# Patient Record
Sex: Female | Born: 1940 | Race: White | Hispanic: No | Smoking: Former smoker
Health system: Southern US, Community
[De-identification: ages and names within clinical notes are randomized; demographics above are authoritative.]

## PROBLEM LIST (undated history)

## (undated) DIAGNOSIS — G629 Polyneuropathy, unspecified: Secondary | ICD-10-CM

## (undated) DIAGNOSIS — M1990 Unspecified osteoarthritis, unspecified site: Secondary | ICD-10-CM

## (undated) DIAGNOSIS — G4733 Obstructive sleep apnea (adult) (pediatric): Secondary | ICD-10-CM

## (undated) DIAGNOSIS — H3530 Unspecified macular degeneration: Secondary | ICD-10-CM

## (undated) DIAGNOSIS — I5032 Chronic diastolic (congestive) heart failure: Secondary | ICD-10-CM

## (undated) DIAGNOSIS — M1711 Unilateral primary osteoarthritis, right knee: Secondary | ICD-10-CM

## (undated) DIAGNOSIS — E538 Deficiency of other specified B group vitamins: Secondary | ICD-10-CM

## (undated) DIAGNOSIS — E785 Hyperlipidemia, unspecified: Secondary | ICD-10-CM

## (undated) DIAGNOSIS — Z9989 Dependence on other enabling machines and devices: Secondary | ICD-10-CM

## (undated) DIAGNOSIS — Z9889 Other specified postprocedural states: Secondary | ICD-10-CM

## (undated) DIAGNOSIS — R112 Nausea with vomiting, unspecified: Secondary | ICD-10-CM

## (undated) DIAGNOSIS — I480 Paroxysmal atrial fibrillation: Secondary | ICD-10-CM

## (undated) DIAGNOSIS — D649 Anemia, unspecified: Secondary | ICD-10-CM

## (undated) DIAGNOSIS — I739 Peripheral vascular disease, unspecified: Secondary | ICD-10-CM

## (undated) DIAGNOSIS — I6523 Occlusion and stenosis of bilateral carotid arteries: Secondary | ICD-10-CM

## (undated) DIAGNOSIS — E119 Type 2 diabetes mellitus without complications: Secondary | ICD-10-CM

## (undated) DIAGNOSIS — Z7901 Long term (current) use of anticoagulants: Secondary | ICD-10-CM

## (undated) DIAGNOSIS — D472 Monoclonal gammopathy: Secondary | ICD-10-CM

## (undated) DIAGNOSIS — N183 Chronic kidney disease, stage 3 unspecified: Secondary | ICD-10-CM

## (undated) DIAGNOSIS — Z794 Long term (current) use of insulin: Secondary | ICD-10-CM

## (undated) DIAGNOSIS — K219 Gastro-esophageal reflux disease without esophagitis: Secondary | ICD-10-CM

## (undated) DIAGNOSIS — E11319 Type 2 diabetes mellitus with unspecified diabetic retinopathy without macular edema: Secondary | ICD-10-CM

## (undated) DIAGNOSIS — I10 Essential (primary) hypertension: Secondary | ICD-10-CM

## (undated) DIAGNOSIS — Z8489 Family history of other specified conditions: Secondary | ICD-10-CM

## (undated) HISTORY — PX: CARDIOVASCULAR STRESS TEST: SHX262

## (undated) HISTORY — PX: TRANSTHORACIC ECHOCARDIOGRAM: SHX275

## (undated) HISTORY — PX: CARDIAC CATHETERIZATION: SHX172

## (undated) HISTORY — PX: TONSILLECTOMY: SUR1361

## (undated) HISTORY — DX: Hyperlipidemia, unspecified: E78.5

## (undated) HISTORY — PX: CAROTID ENDARTERECTOMY: SUR193

---

## 1966-05-19 HISTORY — PX: CHOLECYSTECTOMY OPEN: SUR202

## 1998-04-19 ENCOUNTER — Encounter: Payer: Self-pay | Admitting: Family Medicine

## 1998-04-19 ENCOUNTER — Ambulatory Visit (HOSPITAL_COMMUNITY): Admission: RE | Admit: 1998-04-19 | Discharge: 1998-04-19 | Payer: Self-pay | Admitting: Family Medicine

## 2000-03-13 ENCOUNTER — Encounter: Admission: RE | Admit: 2000-03-13 | Discharge: 2000-03-31 | Payer: Self-pay | Admitting: Orthopedic Surgery

## 2000-04-24 ENCOUNTER — Other Ambulatory Visit: Admission: RE | Admit: 2000-04-24 | Discharge: 2000-04-24 | Payer: Self-pay | Admitting: Family Medicine

## 2000-05-04 ENCOUNTER — Encounter: Payer: Self-pay | Admitting: Family Medicine

## 2000-05-04 ENCOUNTER — Encounter: Admission: RE | Admit: 2000-05-04 | Discharge: 2000-05-04 | Payer: Self-pay | Admitting: Family Medicine

## 2003-06-20 ENCOUNTER — Other Ambulatory Visit: Admission: RE | Admit: 2003-06-20 | Discharge: 2003-06-20 | Payer: Self-pay | Admitting: Family Medicine

## 2004-06-13 ENCOUNTER — Ambulatory Visit (HOSPITAL_COMMUNITY): Admission: RE | Admit: 2004-06-13 | Discharge: 2004-06-13 | Payer: Self-pay | Admitting: Family Medicine

## 2005-05-26 ENCOUNTER — Ambulatory Visit (HOSPITAL_COMMUNITY): Admission: RE | Admit: 2005-05-26 | Discharge: 2005-05-26 | Payer: Self-pay | Admitting: Cardiology

## 2005-05-27 ENCOUNTER — Inpatient Hospital Stay (HOSPITAL_BASED_OUTPATIENT_CLINIC_OR_DEPARTMENT_OTHER): Admission: RE | Admit: 2005-05-27 | Discharge: 2005-05-27 | Payer: Self-pay | Admitting: Cardiology

## 2005-06-05 ENCOUNTER — Encounter: Admission: RE | Admit: 2005-06-05 | Discharge: 2005-06-05 | Payer: Self-pay | Admitting: *Deleted

## 2005-06-06 ENCOUNTER — Inpatient Hospital Stay (HOSPITAL_COMMUNITY): Admission: RE | Admit: 2005-06-06 | Discharge: 2005-06-07 | Payer: Self-pay | Admitting: *Deleted

## 2005-06-06 ENCOUNTER — Encounter (INDEPENDENT_AMBULATORY_CARE_PROVIDER_SITE_OTHER): Payer: Self-pay | Admitting: Specialist

## 2006-06-11 ENCOUNTER — Encounter: Admission: RE | Admit: 2006-06-11 | Discharge: 2006-06-11 | Payer: Self-pay | Admitting: Internal Medicine

## 2006-09-21 ENCOUNTER — Ambulatory Visit: Payer: Self-pay | Admitting: *Deleted

## 2007-05-06 ENCOUNTER — Ambulatory Visit: Payer: Self-pay | Admitting: *Deleted

## 2007-11-11 ENCOUNTER — Ambulatory Visit: Payer: Self-pay | Admitting: *Deleted

## 2008-04-20 ENCOUNTER — Ambulatory Visit: Payer: Self-pay | Admitting: *Deleted

## 2008-10-20 ENCOUNTER — Ambulatory Visit: Payer: Self-pay | Admitting: *Deleted

## 2009-02-18 ENCOUNTER — Encounter: Admission: RE | Admit: 2009-02-18 | Discharge: 2009-02-18 | Payer: Self-pay | Admitting: Sports Medicine

## 2009-02-22 ENCOUNTER — Encounter: Admission: RE | Admit: 2009-02-22 | Discharge: 2009-02-22 | Payer: Self-pay | Admitting: Sports Medicine

## 2009-03-08 ENCOUNTER — Encounter: Admission: RE | Admit: 2009-03-08 | Discharge: 2009-03-08 | Payer: Self-pay | Admitting: Sports Medicine

## 2009-05-17 ENCOUNTER — Ambulatory Visit: Payer: Self-pay | Admitting: Vascular Surgery

## 2009-06-22 ENCOUNTER — Ambulatory Visit: Payer: Self-pay | Admitting: Vascular Surgery

## 2009-07-10 ENCOUNTER — Encounter: Admission: RE | Admit: 2009-07-10 | Discharge: 2009-07-10 | Payer: Self-pay | Admitting: Sports Medicine

## 2009-11-26 ENCOUNTER — Ambulatory Visit: Payer: Self-pay | Admitting: Surgery

## 2010-01-14 ENCOUNTER — Ambulatory Visit: Payer: Self-pay | Admitting: Family Medicine

## 2010-01-14 DIAGNOSIS — E1169 Type 2 diabetes mellitus with other specified complication: Secondary | ICD-10-CM

## 2010-01-14 DIAGNOSIS — I10 Essential (primary) hypertension: Secondary | ICD-10-CM

## 2010-01-14 DIAGNOSIS — L259 Unspecified contact dermatitis, unspecified cause: Secondary | ICD-10-CM | POA: Insufficient documentation

## 2010-01-14 DIAGNOSIS — E785 Hyperlipidemia, unspecified: Secondary | ICD-10-CM | POA: Insufficient documentation

## 2010-01-14 DIAGNOSIS — L03019 Cellulitis of unspecified finger: Secondary | ICD-10-CM | POA: Insufficient documentation

## 2010-01-14 DIAGNOSIS — E669 Obesity, unspecified: Secondary | ICD-10-CM | POA: Insufficient documentation

## 2010-01-17 ENCOUNTER — Telehealth (INDEPENDENT_AMBULATORY_CARE_PROVIDER_SITE_OTHER): Payer: Self-pay | Admitting: *Deleted

## 2010-05-27 ENCOUNTER — Ambulatory Visit
Admission: RE | Admit: 2010-05-27 | Discharge: 2010-05-27 | Payer: Self-pay | Source: Home / Self Care | Attending: Surgery | Admitting: Surgery

## 2010-06-20 NOTE — Progress Notes (Signed)
  Phone Note Outgoing Call Call back at [facility] Phone [phone]   Call placed by: Lajean Saver RN,  January 17, 2010 2:03 PM Call placed to: Patient Summary of Call: Call back: no answer, message left with reason for call

## 2010-06-20 NOTE — Assessment & Plan Note (Signed)
Summary: RIGHT THUMB FINGER INFECTED (procedure)   Vital Signs:  Patient Profile:   69 Years Old Female CC:      ?right thumb infection x 10 days Height:     64.5 inches Weight:      204 pounds O2 Sat:      96 % O2 treatment:    Room Air Temp:     98.3 degrees F oral Pulse rate:   61 / minute Resp:     16 per minute BP sitting:   140 / 73  (left arm) Cuff size:   large  Pt. in pain?   no  Vitals Entered By: Lajean Saver RN (January 14, 2010 11:18 AM)                   Updated Prior Medication List: HUMALOG MIX 75/25 75-25 % SUSP (INSULIN LISPRO PROT & LISPRO)  LANTUS 100 UNIT/ML SOLN (INSULIN GLARGINE) 40u qhs OMEPRAZOLE-SODIUM BICARBONATE 20-1100 MG CAPS (OMEPRAZOLE-SODIUM BICARBONATE)  LOSARTAN POTASSIUM 50 MG TABS (LOSARTAN POTASSIUM)  METOPROLOL-HYDROCHLOROTHIAZIDE 50-25 MG TABS (METOPROLOL-HYDROCHLOROTHIAZIDE)  ASPIRIN 325 MG TABS (ASPIRIN) once daily TRILIPIX 135 MG CPDR (CHOLINE FENOFIBRATE) once daily CRESTOR 40 MG TABS (ROSUVASTATIN CALCIUM) once daily  Current Allergies: ! DARVOCET ! PERCOCET ! * CONTRAST DYEHistory of Present Illness Chief Complaint: ?right thumb infection x 10 days History of Present Illness:  Subjective:  Patient complains of two problems: 1)  She bumped her right thumb about 10 days ago and has developed persistent soreness/swelling on medial edge of fingernail 2)  For about 3 weeks she has had recurring peeling of her hands, feet, and tips of elbows without burning, itching, or discomfort.  She states that she has been swimming frequently during this interval.  REVIEW OF SYSTEMS Constitutional Symptoms      Denies fever, chills, night sweats, weight loss, weight gain, and fatigue.  Eyes       Denies change in vision, eye pain, eye discharge, glasses, contact lenses, and eye surgery. Ear/Nose/Throat/Mouth       Denies hearing loss/aids, change in hearing, ear pain, ear discharge, dizziness, frequent runny nose, frequent nose bleeds,  sinus problems, sore throat, hoarseness, and tooth pain or bleeding.  Respiratory       Denies dry cough, productive cough, wheezing, shortness of breath, asthma, bronchitis, and emphysema/COPD.  Cardiovascular       Denies murmurs, chest pain, and tires easily with exhertion.    Gastrointestinal       Denies stomach pain, nausea/vomiting, diarrhea, constipation, blood in bowel movements, and indigestion. Genitourniary       Denies painful urination, kidney stones, and loss of urinary control. Neurological       Denies paralysis, seizures, and fainting/blackouts. Musculoskeletal       Denies muscle pain, joint pain, joint stiffness, decreased range of motion, redness, swelling, muscle weakness, and gout.  Skin       Complains of hair/skni or nail changes.      Denies bruising and unusual mles/lumps or sores.      Comments: right thumb Psych       Denies mood changes, temper/anger issues, anxiety/stress, speech problems, depression, and sleep problems.  Past History:  Past Medical History: Diabetes mellitus, type I Hyperlipidemia Hypertension  Past Surgical History: Cholecystectomy 1967 Spinal Injections Right endarterectomy  Family History: Family History of Stroke F 1st degree relative <60 Family History Diabetes 1st degree relative  Social History: Never Smoked Alcohol use-no Drug use-no Smoking Status:  never Drug Use:  no   Objective:  No acute distress  Right thumb:  There is tenderness along medial edge of nail without erythema or warmth; minimal swelling.  Normal cap refill Skin:  Palmar surfaces of hands and web spaces:  mild desquamation without erythema, swelling, or tenderness. Feet reveal similar mild desquamation on plantar surfaces.  Over the olecranon surfaces of elbows there is mild hyperkeratosis. Assessment New Problems: DERMATITIS (ICD-692.9) PARONYCHIA, FINGER (ICD-681.02) FAMILY HISTORY DIABETES 1ST DEGREE RELATIVE (ICD-V18.0) HYPERTENSION  (ICD-401.9) HYPERLIPIDEMIA (ICD-272.4) DIABETES MELLITUS, TYPE I (ICD-250.01)  ? ATOPIC DERMATITIS.  DOUBT TINEA MANUUM  Plan New Medications/Changes: TRIAMCINOLONE ACETONIDE 0.1 % CREA (TRIAMCINOLONE ACETONIDE) Apply thin layer to affected area bid  #45gm x 0, 01/14/2010, Donna Christen MD CEPHALEXIN 500 MG CAPS (CEPHALEXIN) One by mouth two times a day  #20 x 0, 01/14/2010, Donna Christen MD  New Orders: New Patient Level III [phone] Planning Comments:   For fingernail begin Keflex and warm soaks.  Given a Water quality scientist patient information and instruction sheet on topic.  Follow-up with PCP if not improving. Begin triamcinolone cream two times a day until improved.  Follow-up with dermatologist if not improving 2 weeks.   The patient and/or caregiver has been counseled thoroughly with regard to medications prescribed including dosage, schedule, interactions, rationale for use, and possible side effects and they verbalize understanding.  Diagnoses and expected course of recovery discussed and will return if not improved as expected or if the condition worsens. Patient and/or caregiver verbalized understanding.  Prescriptions: TRIAMCINOLONE ACETONIDE 0.1 % CREA (TRIAMCINOLONE ACETONIDE) Apply thin layer to affected area bid  #45gm x 0   Entered and Authorized by:   Donna Christen MD   Signed by:   Donna Christen MD on 01/14/2010   Method used:   Print then Give to Patient   RxID:   [id number] CEPHALEXIN 500 MG CAPS (CEPHALEXIN) One by mouth two times a day  #20 x 0   Entered and Authorized by:   Donna Christen MD   Signed by:   Donna Christen MD on 01/14/2010   Method used:   Print then Give to Patient   RxID:   [id number]   Orders Added: 1)  New Patient Level III [29528]

## 2010-10-01 NOTE — Procedures (Signed)
CAROTID DUPLEX EXAM   INDICATION:  Followup carotid artery disease.   HISTORY:  Diabetes:  Yes.  Cardiac:  No.  Hypertension:  Yes.  Smoking:  No.  Previous Surgery:  Right CEA 06/06/2005 by Dr. Madilyn Fireman.  CV History:  No.  Amaurosis Fugax No, Paresthesias No, Hemiparesis No                                       RIGHT             LEFT  Brachial systolic pressure:         152               146  Brachial Doppler waveforms:         Biphasic          Biphasic  Vertebral direction of flow:        Antegrade         Antegrade  DUPLEX VELOCITIES (cm/sec)  CCA peak systolic                   114               M=627, B=255  ECA peak systolic                   221               235  ICA peak systolic                   138 (M/D)         281  ICA end diastolic                   36                69  PLAQUE MORPHOLOGY:                  Intimal hyperplasia                 Mixed  PLAQUE AMOUNT:                      Minimal           Moderate  PLAQUE LOCATION:                    Bifurcation.      CCA/ECA/ICA   IMPRESSION:  1. Right ICA velocity status post CEA appears stable being mildly      elevated with no significant pathology noted.  2. Left ICA shows evidence of 60-79% stenosis.  3. Left distal CCA stenosis.  4. Bilaterally ECA stenosis.  5. No significant changes from previous study.   ___________________________________________  P. Liliane Bade, M.D.   AS/MEDQ  D:  11/11/2007  T:  11/11/2007  Job:  [id number]

## 2010-10-01 NOTE — Procedures (Signed)
CAROTID DUPLEX EXAM   INDICATION:  Carotid disease.   HISTORY:  Diabetes:  Yes.  Cardiac:  No.  Hypertension:  Yes.  Smoking:  No.  Previous Surgery:  Right carotid endarterectomy on 06/06/2005.  CV History:  Currently asymptomatic.  Amaurosis Fugax No, Paresthesias No, Hemiparesis No                                       RIGHT             LEFT  Brachial systolic pressure:         156               152  Brachial Doppler waveforms:         Normal            Normal  Vertebral direction of flow:        Antegrade         Antegrade  DUPLEX VELOCITIES (cm/sec)  CCA peak systolic                   78                59  ECA peak systolic                   138               224  ICA peak systolic                   81                P=191, M=211  ICA end diastolic                   12                P=36, M=44  PLAQUE MORPHOLOGY:                  Heterogeneous     Heterogeneous  PLAQUE AMOUNT:                      Mild              Moderate  PLAQUE LOCATION:                    ICA / CCA         CCA / ECA /  proximal ICA   IMPRESSION:  1. Patent right carotid endarterectomy site with 1% to 39% stenosis of      the right internal carotid artery.  2. Doppler velocities suggest a high end 40% to 59% stenosis of the      left mid internal carotid artery.  3. Significant vessel tortuosity is noted within the left mid internal      carotid artery.  4. Doppler velocities of the left internal carotid artery appear less      than previously recorded when compared to the previous exam on      11/26/2009 with the right internal carotid artery remaining stable.   ___________________________________________  V. Charlena Cross, MD   CH/MEDQ  D:  05/28/2010  T:  05/28/2010  Job:  [id number]

## 2010-10-01 NOTE — Procedures (Signed)
CAROTID DUPLEX EXAM   INDICATION:  Followup evaluation for known carotid artery disease.   HISTORY:  Diabetes:  Yes.  Cardiac:  No.  Hypertension:  Yes.  Smoking:  No.  Previous Surgery:  Right carotid endarterectomy on 06/06/2005 by Dr.  Madilyn Fireman.  CV History:  The patient reports no cerebrovascular symptoms.  Previous  duplex on 04/20/2008 revealed 20-39% right ICA stenosis status post  endarterectomy and a 60-79% left ICA stenosis.  Amaurosis Fugax No, Paresthesias No, Hemiparesis No                                       RIGHT             LEFT  Brachial systolic pressure:         166               168  Brachial Doppler waveforms:         Triphasic         Triphasic  Vertebral direction of flow:        Antegrade         Antegrade  DUPLEX VELOCITIES (cm/sec)  CCA peak systolic                   60                55  ECA peak systolic                   226               402  ICA peak systolic                   104               274  ICA end diastolic                   30                68  PLAQUE MORPHOLOGY:                  Soft              Mixed  PLAQUE AMOUNT:                      Minimal           Moderate  PLAQUE LOCATION:                    Proximal ICA      Proximal ICA   IMPRESSION:  1. 20-39% right ICA stenosis status post endarterectomy.  2. 60-79% left ICA stenosis.  3. Bilateral ECA stenosis.  4. No significant change from previous study performed 04/20/2008.   ___________________________________________  P. Liliane Bade, M.D.   MC/MEDQ  D:  10/20/2008  T:  10/20/2008  Job:  [id number]

## 2010-10-01 NOTE — Procedures (Signed)
CAROTID DUPLEX EXAM   INDICATION:  Followup carotid artery disease.   HISTORY:  Diabetes:  Yes.  Cardiac:  No.  Hypertension:  Yes.  Smoking:  No.  Previous Surgery:  Right CEA 0l/19/2007 by Dr. Madilyn Fireman.  CV History:  Asymptomatic.  Amaurosis Fugax No, Paresthesias No, Hemiparesis No                                       RIGHT             LEFT  Brachial systolic pressure:         140               138  Brachial Doppler waveforms:         WNL               WNL  Vertebral direction of flow:        Antegrade         Antegrade  DUPLEX VELOCITIES (cm/sec)  CCA peak systolic                   107               M=69, D=299  ECA peak systolic                   225               300  ICA peak systolic                   108               316  ICA end diastolic                   34                77  PLAQUE MORPHOLOGY:                  Homogeneous       Mixed  PLAQUE AMOUNT:                      Minimal           Moderate / severe  PLAQUE LOCATION:                    ICA / ECA         ICA / CCA / ECA   IMPRESSION:  1. Right internal carotid artery shows evidence of 20%-39% stenosis      (top end of range).  2. Left internal carotid artery shows evidence of 60%-79% stenosis.  3. Left distal common carotid artery stenosis, which extends into the      internal carotid artery and external carotid artery.  4. Bilateral external carotid artery stenosis.  5. No significant changes from recent previous studies.  6. Previous patient of Dr. Madilyn Fireman with followup appointment scheduled      to see Dr. Arbie Cookey.   ___________________________________________  Larina Earthly, M.D.   AS/MEDQ  D:  05/17/2009  T:  05/17/2009  Job:  [id number]

## 2010-10-01 NOTE — Procedures (Signed)
CAROTID DUPLEX EXAM   INDICATION:  Follow up known carotid disease.   HISTORY:  Diabetes:  Yes.  Cardiac:  No.  Hypertension:  Yes.  Smoking:  No.  Previous Surgery:  Right carotid endarterectomy on 06/06/05 by Dr.  Madilyn Fireman.  CV History:  Asymptomatic.  Amaurosis Fugax No, Paresthesias No hemiparesis No.                                       RIGHT             LEFT  Brachial systolic pressure:         150               148  Brachial Doppler waveforms:         Normal            Normal  Vertebral direction of flow:        Antegrade         Antegrade  DUPLEX VELOCITIES (cm/sec)  CCA peak systolic                   93                68  ECA peak systolic                   202               392  ICA peak systolic                   109               305  ICA end diastolic                   38                65  PLAQUE MORPHOLOGY:                  Homogeneous       Mixed  PLAQUE AMOUNT:                      Minimal           Moderate-to-severe  PLAQUE LOCATION:                    ICA, ECA          ICA, ECA, CCA   IMPRESSION:  1. Doppler velocities suggest high-end 20% to 39% stenosis in the      right internal carotid artery.  2. Doppler velocities suggest mid to high-end 60% to 79% stenosis in      the left internal carotid artery.  3. Left external carotid artery stenosis.  4. Antegrade flow in bilateral vertebral arteries.  5. No significant change from previous examinations.   ___________________________________________  V. Charlena Cross, MD   NT/MEDQ  D:  11/26/2009  T:  11/26/2009  Job:  [id number]

## 2010-10-01 NOTE — Assessment & Plan Note (Signed)
OFFICE VISIT   Gloria [name], CYPERT A  DOB:  [birth year]                                       06/22/2009  ZOXWR#:[id number]   The patient is a former patient of Dr. Madilyn Fireman.  She underwent a right  carotid endarterectomy with Dacron patch angioplasty in 2007.  She has  been followed periodically by the vascular lab with carotid duplex.  She  returns for a 6 month followup.  The patient is a 70 year old woman who  as previously stated underwent right carotid endarterectomy and has been  followed on a 6 month basis for surveillance purposes.  At the time of  this visit she reports no symptoms consistent with TIAs or neurological  impairments from her carotid arteries.  She is currently not smoking.  She does have hypertension and diabetes which are currently stable.  She  does have some back pain and is being treated by Dr. Luciana Axe for back  injections.  She does also have macular degeneration which is stable at  this time.   REVIEW OF SYSTEMS:  All interrogatories were answered negatively.   PHYSICAL FINDINGS:  General:  Revealed a well-nourished woman in no  distress.  Vital signs:  Heart rate was 71, blood pressure 175/78,  temperature was 98 degrees.  HEENT:  PERRLA.  EOMI.  Normal  conjunctivae.  Mucous membranes were pink and moist.  Lungs:  Clear to  auscultation.  Cardiac:  Revealed a regular rate and rhythm.  No carotid  bruits were appreciated.  Peripheral pulses were present in all four  extremities.  Abdomen:  Soft, nontender.  Musculoskeletal:  There were  no major deformities of her musculoskeletal system.  There were no focal  weaknesses appreciated.  Skin:  No ulcers or rashes were appreciated.   LABORATORY RESULTS:  The patient underwent a carotid duplex on  05/17/2009.  This was reviewed by myself and Dr. Arbie Cookey.  Her right  internal carotid artery demonstrated a 20%-39% narrowing status post  endarterectomy.  Her left internal carotid artery  demonstrated a 60%-79%  narrowing.  These were stable from 6 months ago.   MEDICAL DECISION MAKING:  The patient continues to do well following  right carotid endarterectomy in 2007.  She does have a stable plaque at  this time in her left carotid artery.  We will continue to follow this  on a 6 month basis.  To that end she is given a return appointment for  surveillance in 6 months.   Wilmon Arms, PA   Larina Earthly, M.D.  Electronically Signed   KEL/MEDQ  D:  06/22/2009  T:  06/22/2009  Job:  [id number]

## 2010-10-01 NOTE — Procedures (Signed)
CAROTID DUPLEX EXAM   INDICATION:  Followup carotid artery disease.   HISTORY:  Diabetes:  Yes.  Cardiac:  No.  Hypertension:  Yes.  Smoking:  No.  Previous Surgery:  Right CEA on 06/06/2005 by Dr. Madilyn Fireman.  CV History:  No.  Amaurosis Fugax No, Paresthesias No, Hemiparesis No                                       RIGHT               LEFT  Brachial systolic pressure:         162                 163  Brachial Doppler waveforms:         Within normal limits                  Within normal limits  Vertebral direction of flow:        Antegrade           Antegrade  DUPLEX VELOCITIES (cm/sec)  CCA peak systolic                   147                 55 (mid),147  (bulb)  ECA peak systolic                   250                 434  ICA peak systolic                   108                 360  ICA end diastolic                   21                  50  PLAQUE MORPHOLOGY:                  Heterogenous        Heterogenous  PLAQUE AMOUNT:                      Moderate            Moderate  PLAQUE LOCATION:                    Bifurcation/ICA/CCA ECA/ICA   IMPRESSION:  1. Moderate heterogenous restenosed plaque of the right ICA which does      not suggest hemodynamic significance.  2. Moderate heterogenous plaque of the left ICA which suggests 60-70%      of stenosis.  3. Bilateral ECA stenosis.  4. No significant change from the last study.       ___________________________________________  P. Liliane Bade, M.D.   AC/MEDQ  D:  04/20/2008  T:  04/20/2008  Job:  [id number]

## 2010-10-01 NOTE — Procedures (Signed)
CAROTID DUPLEX EXAM   INDICATION:  Followup of known carotid artery disease.  Patient is  asymptomatic.   HISTORY:  Diabetes:  Yes, insulin dependent.  Cardiac:  No.  Hypertension:  Yes.  Smoking:  No.  Previous Surgery:  Right CEA on 06/06/05 by Dr. Madilyn Fireman.  CV History:  Amaurosis Fugax No, Paresthesias No, Hemiparesis No                                       RIGHT             LEFT  Brachial systolic pressure:         146               154  Brachial Doppler waveforms:         WNL               WNL  Vertebral direction of flow:        Antegrade         Antegrade  DUPLEX VELOCITIES (cm/sec)  CCA peak systolic                   151 (distal)      356 (distal)  ECA peak systolic                   286               268  ICA peak systolic                   170               245  ICA end diastolic                   13                54  PLAQUE MORPHOLOGY:                  None              Mixed  PLAQUE AMOUNT:                      N/A               Moderate  PLAQUE LOCATION:                    Bifurcation, ECA  DCCA, bifurcation,  ECA, ICA   IMPRESSION:  1. Right internal carotid artery status post carotid endarterectomy      with mild velocity acceleration noticed throughout the internal      carotid artery; however, no significant pathology noted.  2. Left distal common carotid artery with greater than 50% stenosis      extending into the proximal internal carotid artery, causing a 60-      79% stenosis (at low end of range).  3. Bilateral external carotid artery stenoses.   ___________________________________________  P. Liliane Bade, M.D.   PB/MEDQ  D:  05/06/2007  T:  05/07/2007  Job:  [id number]

## 2010-10-04 NOTE — Discharge Summary (Signed)
NAMEHUDSYN, BARICH           ACCOUNT NO.:  [id number]   MEDICAL RECORD NO.:  [id number]          PATIENT TYPE:  INP   LOCATION:  3305                         FACILITY:  [facility]   PHYSICIAN:  Balinda Quails, M.D.    DATE OF BIRTH:  [birth year]   DATE OF ADMISSION:  06/06/2005  DATE OF DISCHARGE:  06/07/2005                                 DISCHARGE SUMMARY   PRIMARY DIAGNOSIS:  Right internal carotid artery stenosis.   SECONDARY DIAGNOSES:  1.  Diabetes mellitus type 2.  2.  Hypertension.  3.  Hyperlipidemia.  4.  Remote history of tobacco abuse.   ALLERGIES:  Allergic to CONTRAST DYE, LEVAQUIN, DARVOCET.   IN-HOSPITAL OPERATIONS AND PROCEDURES:  Right carotid endarterectomy with  Dacron patch angioplasty.   HISTORY AND PHYSICAL AND HOSPITAL COURSE:  Gloria [name] is a 70 year old  female who was electively admitted to [facility] for planned right  carotid endarterectomy.  The patient complained of some lightheadedness on  arising from the sitting physician to a lying position.  Carotid duplex was  done, which revealed severe proximal right internal carotid artery stenosis  greater than 80%.  There was moderately severe left internal carotid artery  stenosis in the 60-80% range.  Subsequent CT angiography of the neck  revealed a 90% right internal carotid artery stenosis and approximately 50%  left internal carotid artery stenosis.  The patient was seen and evaluated  by Dr. Madilyn Fireman.  Dr. Madilyn Fireman discussed with the patient undergoing right carotid  endarterectomy.  He discussed the risks and benefits of the procedure.  The  patient acknowledged understanding and agreed to proceed.  Surgery was  scheduled for June 06, 2005.   The patient was taken to the operating room June 06, 2005, where she  underwent right carotid endarterectomy with Dacron patch angioplasty.  The  patient tolerated this procedure well and was transferred up to intensive  care unit in stable  condition.  Following surgery, the patient was seen to  be alert and or x3.  Neuro was intact.  The patient's postoperative was  course pretty much unremarkable.  She was out of bed ambulating well.  The  patient's vital signs were monitored during her postoperative course, seen  to be stable.  She was able to be weaned off oxygen saturating greater than  90% prior to discharge home.  Incisions were dry and intact and healing  well.  The patient was tolerating a regular diet well and no nausea or  vomiting noted.  Her neuro remained intact during her postoperative course.   The patient was discharged home postop day #1 in stable condition.  A follow-  up appointment had been made to follow up with Dr. Madilyn Fireman July 10, 2005,  at 9:50 a.m.  Ms. Brazier received instructions on diet, activity level and  incisional care.  She was told no driving until released to do so, no heavy  lifting over 10 pounds.  The patient was told she is allowed to shower,  washing her incisions using soap and water.  She is to contact the office if  she develops  any drainage or opening from any of her incision sites.  The  patient was told to ambulate three to four times per day, progress as  tolerated, and to continue her breathing exercises.  She was educated on  diet to be low-fat, low-salt.  The patient acknowledged her understanding.   DISCHARGE MEDICATIONS:  1.  Prednisone 60 mg b.i.d.  2.  Hydrochlorothiazide 25 mg daily.  3.  Cozaar 50 mg daily.  4.  Metformin 500 mg b.i.d.  5.  Omeprazole 20 mg daily.  6.  Doxazosin 200 mg daily.  7.  Lantus 19 units at night.  8.  Byetta 5 mg before breakfast.  9.  Zantac before supper p.r.n.  10. Aspirin 325 mg daily.  11. Percocet one to two tablets q.4-6h.  p.r.n. pain.  Stephanie Acre Dominick, PA      P. Liliane Bade, M.D.  Electronically Signed    KMD/MEDQ  D:  08/12/2005  T:  08/13/2005  Job:  [id number]

## 2010-10-04 NOTE — Op Note (Signed)
NAMELATRONDA, SPINK NO.:  [id number]   MEDICAL RECORD NO.:  [id number]          PATIENT TYPE:  INP   LOCATION:  3305                         FACILITY:  [facility]   PHYSICIAN:  Balinda Quails, M.D.    DATE OF BIRTH:  1940-10-08   DATE OF PROCEDURE:  06/06/2005  DATE OF DISCHARGE:  06/07/2005                                 OPERATIVE REPORT   SURGEON:  Balinda Quails, M.D.   ASSISTANT:  Pecola Leisure, P.A.-C. A   ANESTHESIA:  General endotracheal.   ANESTHESIOLOGIST:  Judie Petit, M.D.   PREOPERATIVE DIAGNOSIS:  Right internal carotid artery stenosis.   POSTOPERATIVE DIAGNOSIS:  Right internal carotid artery stenosis.   PROCEDURE:  Right carotid endarterectomy with Dacron patch angioplasty.   CLINICAL NOTE:  Gloria [name] is a 70 year old female with type 2  diabetes.  She has a history of coronary artery disease.  She has a severe  right internal carotid artery stenosis by Doppler evaluation.  She is  brought to the operating room at this time for right carotid endarterectomy.   OPERATIVE PROCEDURE:  The patient was brought to the operating room in  stable hemodynamic condition.Marland Kitchen  She was placed in the supine position.  General endotracheal anesthesia was induced.  Foley catheter and arterial  line were placed.  The right neck was prepped and draped in a sterile  fashion.  A curvilinear skin incision was made along the anterior border of  the right sternomastoid muscle.  Dissection was carried through the  subcutaneous tissue with electrocautery.  Deep dissection was carried down  to expose the carotid bifurcation.  The facial vein was ligated with 2-0  silk and divided.  The common carotid artery was mobilized down to the  omohyoid muscle and encircled with a vessel loop.  The internal carotid  artery was then followed distally up to the posterior belly of the digastric  muscle and encircled with a vessel loop.  The superior thyroid and  external  carotid were exposed and encircled with vessel loops.   The vagus nerve and hypoglossal nerve were both clearly identified and  preserved.  The patient was administered 7000 units heparin intravenously.  Adequate circulation time was permitted.  The carotid vessels were  controlled with clamps.  A longitudinal arteriotomy was made in the distal  common carotid artery.  The arteriotomy was extended across the carotid bulb  and into the internal carotid artery.  There was a focal plaque at the  origin of the right internal carotid artery with a high grade stenosis.  A  shunt was inserted.   The endarterectomy elevator was used to remove the plaque.  The  endarterectomy was carried down to the common carotid artery where the  plaque was divided transversely with Potts scissors.  The plaque was then  raised up into the bulb where the superior thyroid and external carotid were  endarterectomized using an eversion technique.  The distal internal carotid  artery plaque feathered out well.  Fragments of plaque were removed with  fine forceps.  This area was irrigated with heparin saline solution.  A patch angioplasty of the endarterectomy site was then carried out with a  Finesse Dacron patch using running 6-0 Prolene suture.  At completion of the  patch angioplasty, the shunt was removed.  All vessels were well flushed.  The clamps were removed directing the initial antegrade flow up the external  carotid artery, following this, the internal carotid was released.  There is  an excellent pulse and Doppler signal in the distal internal carotid artery.   Adequate hemostasis obtained.  Sponges and instrument counts were correct.  The patient was administered 50 mg protamine intravenously.  The  sternomastoid fascia was then closed with a running 2-0 Vicryl suture.  The  platysma was closed with a running 3-0 Vicryl suture.  The skin was closed  with 4-0 Monocryl.  Steri-Strips were  applied.  The patient tolerated the  procedure well.  She was transferred to the recovery room in stable  condition.  Neurologically intact.  No apparent complications.      Balinda Quails, M.D.  Electronically Signed     PGH/MEDQ  D:  11/05/2005  T:  11/05/2005  Job:  [id number]   cc:   Colleen Can. Deborah Chalk, M.D.  Fax: [phone]   Gaspar Garbe, M.D.  Fax: [phone]

## 2010-10-04 NOTE — H&P (Signed)
NAMEMARENA, WITTS NO.:  [id number]   MEDICAL RECORD NO.:  [id number]          PATIENT TYPE:  OUT   LOCATION:  VASC                         FACILITY:  [facility]   PHYSICIAN:  Colleen Can. Deborah Chalk, M.D.DATE OF BIRTH:  1940/09/30   DATE OF ADMISSION:  05/26/2005  DATE OF DISCHARGE:                                HISTORY & PHYSICAL   CHIEF COMPLAINT:  Chest pain.   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 She has multiple cardiovascular risk factors. She presents for  elective cardiac catheterization. She has had intermittent episodes of  substernal chest discomfort. Approximately 3 weeks ago while she was working  in the intensive care unit she did have a feeling of indigestion. She  subsequently developed a substernal chest discomfort that radiated to the  arms and into the neck with mild diaphoresis that lasted approximately 10  minutes. She now presents for elective heart catheterization.   PAST MEDICAL HISTORY:  1.  A 20-year history of hypertension.  2.  A 10-year history of diabetes.  3.  Hyperlipidemia. She is statin intolerant.  4.  Positive family history of heart disease.  5.  History of cigarette abuse.  6.  Stress fracture of the right foot with subsequent sedentary lifestyle  7.  Gastroesophageal reflux disease.  8.  History of angioedema with prior history of intubation at age 1.  57.  Neuropathy.   SURGICAL HISTORY:  1.  Cholecystectomy in 1968.  2.  Childbirth x3.   ALLERGIES:  DARVOCET.   CURRENT MEDICINES:  1.  Cardura 2 mg a day.  2.  Hydrochlorothiazide 25 mg a day.  3.  Glucophage 500 t.i.d.  4.  Lantus 18 units at bedtime.  5.  Byetta 5 mcg daily.   FAMILY HISTORY:  Father died at age 66 with stroke. Mother died at age 6 of  cardiac disease. She had one sister who died of vascular disease at the age  of 92. Another  sister at age 52 has vascular disease. She has three  daughters ages 3, 66, and 36 who are alive and well.   SOCIAL HISTORY:  She is employed at [employer] in the surgical  intensive care unit. She works approximately 40 hours a week. She is  divorced. She has smoked for several years but is currently only smoking a  few cigarettes a day. She does not use alcohol.   REVIEW OF SYSTEMS:  She does have a mild left cataract. She has had some  mild respiratory symptoms with clear mucus over the past 1 month with  associated cough. Creatinine chronically runs approximately 1.6. She has had  a history of depression which is currently stable. 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   EXAMINATION:  GENERAL:  She is a very pleasant obese white female in no  acute distress.  VITAL SIGNS:  Weight is 194 pounds. Blood pressure is 190/80 sitting  and  204/68 standing. Heart rate is 80 and regular, respirations 18.  HEENT:  Shows bilateral carotid bruits.  LUNGS:  Reasonably clear.  HEART:  Shows a regular rhythm with normal S1 and S2.  ABDOMEN:  Soft, positive bowel sounds, nontender.  EXTREMITIES:  Without edema.  NEUROLOGIC:  Intact.   A 12-lead electrocardiogram shows sinus rhythm with minor ST and T wave  changes. Other labs are pending.   OVERALL IMPRESSION:  1.  Chest pain.  2.  Multiple cardiovascular risk factors.  3.  Bilateral carotid artery bruits.  4.  Longstanding hypertension.  5.  Longstanding diabetes.  6.  Hyperlipidemia with statin intolerance.  7.  Gastroesophageal reflux.  8.  History of angioedema.  9.  Obesity.   PLAN:  Carotid Dopplers have been arranged. Will proceed on with cardiac  catheterization. She will be premedicated with histamine blockers,  prednisone. The procedure risks and benefits have all been explained and she  is willing to proceed.      Sharlee Blew,  N.P.      Colleen Can. Deborah Chalk, M.D.  Electronically Signed    LC/MEDQ  D:  05/26/2005  T:  05/26/2005  Job:  [id number]   cc:   Gaspar Garbe, M.D.  Fax: [phone]

## 2010-10-04 NOTE — Cardiovascular Report (Signed)
NAMELORANA, MAFFEO           ACCOUNT NO.:  [id number]   MEDICAL RECORD NO.:  [id number]          PATIENT TYPE:  OIB   LOCATION:  1961                         FACILITY:  [facility]   PHYSICIAN:  Colleen Can. 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  She has multiple cardiovascular risk factors.   PROCEDURE:  Left heart catheterization with selective coronary angiography,  left ventricular angiography.   TYPE AND SITE OF ENTRY:  Percutaneous right femoral artery.   CATHETERS:  4-French 4 curved Judkins right and left coronary catheters, 4-  French pigtail ventriculographic catheter.   CONTRAST MATERIAL:  Omnipaque.   MEDICATIONS GIVEN PRIOR TO PROCEDURE:  Benadryl 50 mg IV, prednisone 60 mg  x2 doses, and IV Pepcid.   MEDICATIONS GIVEN DURING PROCEDURE:  Versed 2 mg IV.   COMMENTS:  The patient tolerated the procedure well.   HEMODYNAMIC DATA:  1.  Aortic pressure is 142/68.  2.  LV was 160/13-17.  3.  There was no aortic valve gradient noted on pullback.   ANGIOGRAPHIC DATA:  1.  The left main coronary artery is normal.  2.  Left circumflex:  The left circumflex is a relatively large system.  The      left circumflex bifurcates high in the posterolateral branch and a      bifurcating obtuse marginal.  There is a 60% narrowing at the origin of      the bifurcating obtuse marginal vessel.  3.  Left anterior descending:  The left anterior descending is a moderate-      size vessel that extends around the apex.  It has two small diagonal      vessels.  The second diagonal is the largest and has a 60% ostial      narrowing.  There are irregularities in the left anterior descending      otherwise as  well as tortuosity.  There is no significant focal      obstructive disease present.  4.  Right coronary artery:  The right coronary artery is a small, dominant      vessel.  It has minor irregularities, but no obstructive disease is      present.   Left ventricular angiograms were performed in the RAO position. Overall  cardiac size and silhouette are normal, global ejection fraction is 60%.  Regional wall motion is normal.   Abdominal aortogram:  Abdominal aortogram was performed using 30 mL contrast  at 16 mL/sec.  The renal arteries bilaterally were patent without  significant obstructive disease.  There were minor 30-40% narrowings but  certainly not critical stenosis.   OVERALL IMPRESSION:  1.  Normal left ventricular function.  2.  Mild to moderate coronary atherosclerosis with a 60% ostial narrowing in      a bifurcating obtuse marginal and 60% narrowing in the origin of the  second diagonal, with minor irregularities otherwise.  3.  There is no significant renal artery stenosis.      Colleen Can. Deborah Chalk, M.D.  Electronically Signed     SNT/MEDQ  D:  05/27/2005  T:  05/27/2005  Job:  [id number]   cc:   Gaspar Garbe, M.D.  Fax: [phone]

## 2010-10-04 NOTE — H&P (Signed)
NAMELINDELL, TUSSEY NO.:  [id number]   MEDICAL RECORD NO.:  [id number]           PATIENT TYPE:   LOCATION:                               FACILITY:  [facility]   PHYSICIAN:  Balinda Quails, M.D.    DATE OF BIRTH:  04-19-1941   DATE OF ADMISSION:  06/05/2005  DATE OF DISCHARGE:                                HISTORY & PHYSICAL   REASON FOR ADMISSION:  Severe left internal carotid artery stenosis.   HISTORY:  Gloria [name] is a 70 year old female admitted for management  of severe right internal carotid artery stenosis.   Dictation ends here.      Balinda Quails, M.D.  Electronically Signed     PGH/MEDQ  D:  06/05/2005  T:  06/05/2005  Job:  [id number]   cc:   Colleen Can. Deborah Chalk, M.D.  Fax: [phone]   Gaspar Garbe, M.D.  Fax: [phone]

## 2010-10-04 NOTE — H&P (Signed)
Gloria [name] NO.:  [id number]   MEDICAL RECORD NO.:  [id number]           PATIENT TYPE:   LOCATION:                                 FACILITY:   PHYSICIAN:  Balinda Quails, M.D.    DATE OF BIRTH:  [birth year]   DATE OF ADMISSION:  06/05/2005  DATE OF DISCHARGE:                                HISTORY & PHYSICAL   REASON FOR ADMISSION:  Severe right internal carotid artery stenosis.   HISTORY:  Gloria [name] is a 70 year old female electively admitted to  [facility] for planned right carotid endarterectomy.  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  She has multiple medical  problems.  She had noted some light headedness when arising from a sitting  position or lying position.  She has also noted some mild headache.  She  denies a visual disturbance.  No sensory or motor deficit.  Carotid Doppler  was carried out revealing severe proximal right internal carotid artery  stenosis greater than 80%.  Moderately severe left internal carotid artery  stenosis in the 60-80% range.  Subsequent CT angiography of the neck reveals  a 90% right internal carotid artery stenosis and approximately a 50% left  internal carotid artery stenosis.  There is no documented history of stroke.   PAST MEDICAL HISTORY:  1.  Type 2 diabetes.  2.  Hypertension.  3.  Hyperlipidemia.  4.  Remote history of tobacco abuse.   MEDICATIONS:  1.  Hydrochlorothiazide 25 mg daily.  2.  Cozaar 50 mg daily.  3.  Metformin 500 mg q.a.m. and 1000 mg q.h.s.  4.  Omeprazole 20 mg daily.  5.  Doxazosin 2 mg daily.  6.  Lantus insulin 19 units h.s.  7.  Bietta 5 mcg b.i.d.   ALLERGIES:  Contrast dye, Levaquin, and Darvocet.   SOCIAL HISTORY:  The patient is single with three grown children.  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  She smokes 2-3  cigarettes daily.  She has previously smoked significantly.  She does not  consume  alcohol.   REVIEW OF SYSTEMS:  She notes occasional chest tightness or pressure.  Denies chest pain.  No shortness of breath.  No cough or sputum production.  She has symptoms of chronic GI reflux.  No constipation.  No dysuria or  frequency.  Denies claudication symptoms.  Occasional headaches and  dizziness.  Arthritic discomfort noted.   FAMILY HISTORY:  Significant for cardiovascular disease in her mother and  her sister.   PHYSICAL EXAMINATION:  GENERAL:  70 year old female, alert and oriented, mild obesity.  VITAL SIGNS:  Blood pressure 130/60, pulse 76 per minute, respirations 18  per minute.  HEENT:  Mouth and throat are clear, normocephalic, extraocular movements  intact, no scleral icterus.  NECK:  Supple.  Right carotid bruit audible.  CHEST:  Equal air entry bilaterally.  No rales or rhonchi.  CARDIOVASCULAR:  Soft carotid bruits bilaterally.  Normal heart sounds  without murmurs.  ABDOMEN:  Soft, nontender, mild to moderate obesity,  no masses or  organomegaly felt.  No abdominal bruits.  EXTREMITIES:  No peripheral edema.  NEUROLOGICAL:  Cranial nerves intact, strength equal bilaterally, normal  gait, reflexes 2+, sensation normal.   IMPRESSION:  1.  Asymptomatic severe right internal carotid artery stenosis and moderate      left internal carotid artery stenosis.  2.  Type 2 diabetes.  3.  Hypertension.  4.  Hyperlipidemia.   RECOMMENDATIONS:  Right carotid endarterectomy for reduction of stroke risk.  Surgery scheduled for June 06, 2005, at [facility].  The risks  of the operative procedure including major morbidity and mortality of 1-2%  to include but not limited to MI, CVA, cranial nerve injury, and death.      Balinda Quails, M.D.  Electronically Signed     PGH/MEDQ  D:  06/05/2005  T:  06/05/2005  Job:  [id number]   cc:   Colleen Can. Deborah Chalk, M.D.  Fax: [phone]   Gaspar Garbe, M.D.  Fax: [phone]

## 2010-12-04 ENCOUNTER — Other Ambulatory Visit: Payer: Self-pay | Admitting: Internal Medicine

## 2010-12-04 DIAGNOSIS — Z1231 Encounter for screening mammogram for malignant neoplasm of breast: Secondary | ICD-10-CM

## 2010-12-10 ENCOUNTER — Ambulatory Visit
Admission: RE | Admit: 2010-12-10 | Discharge: 2010-12-10 | Disposition: A | Discharge status: Home / Self Care | Payer: Medicare Other | Source: Ambulatory Visit | Attending: Internal Medicine | Admitting: Internal Medicine

## 2010-12-10 DIAGNOSIS — Z1231 Encounter for screening mammogram for malignant neoplasm of breast: Secondary | ICD-10-CM

## 2011-05-07 ENCOUNTER — Other Ambulatory Visit: Payer: Self-pay | Admitting: Neurological Surgery

## 2011-05-07 DIAGNOSIS — M47816 Spondylosis without myelopathy or radiculopathy, lumbar region: Secondary | ICD-10-CM

## 2011-05-09 ENCOUNTER — Ambulatory Visit
Admission: RE | Admit: 2011-05-09 | Discharge: 2011-05-09 | Disposition: A | Discharge status: Home / Self Care | Payer: Medicare Other | Source: Ambulatory Visit | Attending: Neurological Surgery | Admitting: Neurological Surgery

## 2011-05-09 DIAGNOSIS — M47816 Spondylosis without myelopathy or radiculopathy, lumbar region: Secondary | ICD-10-CM

## 2012-01-06 ENCOUNTER — Other Ambulatory Visit: Payer: Self-pay | Admitting: Internal Medicine

## 2012-01-06 DIAGNOSIS — Z1231 Encounter for screening mammogram for malignant neoplasm of breast: Secondary | ICD-10-CM

## 2012-01-08 ENCOUNTER — Ambulatory Visit
Admission: RE | Admit: 2012-01-08 | Discharge: 2012-01-08 | Disposition: A | Discharge status: Home / Self Care | Payer: Medicare Other | Source: Ambulatory Visit | Attending: Internal Medicine | Admitting: Internal Medicine

## 2012-01-08 DIAGNOSIS — Z1231 Encounter for screening mammogram for malignant neoplasm of breast: Secondary | ICD-10-CM

## 2012-01-20 ENCOUNTER — Other Ambulatory Visit: Payer: Self-pay | Admitting: Neurological Surgery

## 2012-01-20 ENCOUNTER — Other Ambulatory Visit (HOSPITAL_COMMUNITY): Payer: Self-pay | Admitting: Neurological Surgery

## 2012-01-20 DIAGNOSIS — M48061 Spinal stenosis, lumbar region without neurogenic claudication: Secondary | ICD-10-CM

## 2012-02-02 ENCOUNTER — Encounter (HOSPITAL_COMMUNITY): Payer: Self-pay | Admitting: Pharmacy Technician

## 2012-02-03 ENCOUNTER — Ambulatory Visit (HOSPITAL_COMMUNITY)
Admission: RE | Admit: 2012-02-03 | Discharge: 2012-02-03 | Disposition: A | Discharge status: Home / Self Care | Payer: Medicare Other | Source: Ambulatory Visit | Attending: Neurological Surgery | Admitting: Neurological Surgery

## 2012-02-03 DIAGNOSIS — M48061 Spinal stenosis, lumbar region without neurogenic claudication: Secondary | ICD-10-CM | POA: Insufficient documentation

## 2012-02-03 MED ORDER — DIAZEPAM 5 MG PO TABS
ORAL_TABLET | ORAL | Status: AC
  Filled 2012-02-03: qty 2

## 2012-02-03 MED ORDER — ONDANSETRON HCL 4 MG/2ML IJ SOLN: 4.0000 mg | Freq: Four times a day (QID) | INTRAMUSCULAR | Status: DC | PRN

## 2012-02-03 MED ORDER — PROMETHAZINE HCL 25 MG PO TABS
25.0000 mg | ORAL_TABLET | Freq: Once | ORAL | Status: AC
  Administered 2012-02-03: 25 mg via ORAL
  Filled 2012-02-03: qty 1

## 2012-02-03 MED ORDER — DIAZEPAM 5 MG PO TABS
10.0000 mg | ORAL_TABLET | Freq: Once | ORAL | Status: AC
  Administered 2012-02-03: 10 mg via ORAL

## 2012-02-03 MED ORDER — HYDROCODONE-ACETAMINOPHEN 5-325 MG PO TABS
1.0000 | ORAL_TABLET | Freq: Once | ORAL | Status: AC
  Administered 2012-02-03: 2 via ORAL

## 2012-02-03 MED ORDER — IOHEXOL 180 MG/ML  SOLN
10.0000 mL | Freq: Once | INTRAMUSCULAR | Status: AC | PRN
  Administered 2012-02-03: 10 mL via INTRATHECAL

## 2012-02-03 MED ORDER — HYDROCODONE-ACETAMINOPHEN 5-325 MG PO TABS
ORAL_TABLET | ORAL | Status: AC
  Administered 2012-02-03: 2 via ORAL
  Filled 2012-02-03: qty 2

## 2012-02-03 MED ORDER — HYDROCODONE-ACETAMINOPHEN 10-325 MG PO TABS: 1.0000 | ORAL_TABLET | Freq: Once | ORAL | Status: DC

## 2012-02-03 NOTE — Procedures (Signed)
Gloria [name]  #[id number] DOB:  [birth year] 01/14/2012:     Gloria [name] returns to the office today.  After her last epidural steroid injection, she got some modest relief.  She was away in [place] for a month of vacation and she notes that during that time she had recurrence of pain in the back and buttocks, worse on the left lower extremity than on the right.  The pain would be accentuated when she would have to stand or walk any distance.  Sitting and lying down would relieve it.  She has been suffering with this pain for some time now and the last injection really has not done anything to alleviate it.    I reviewed her MRI from December 2012.  It demonstrates that overall her back is in good health save for L4-L5 where she has some modest facet hypertrophy and on the right side she has a moderately sized synovial cyst.  This is quite notable; however, her worst radicular symptoms are on the left side.  She does have a moderately severe stenosis at this level.  The rest of her back actually looks quite healthy.    I discussed this with Gloria [name] in terms of consideration of what to do with her lumbar spine.  I indicated that we could do a simple decompression of the right sided synovial cyst; however, I am not sure this would affect her left-sided pain to any significant degree.  I am further concerned about the stability of her lumbar spine across L4-L5.  Certainly on the MRI her alignment is quite good and stable but I am not sure it stays this way through flexion and extension or when she stands.  I indicated to Gloria [name] that the best way to proceed would be to do a myelogram and postmyelogram CT scan.  The myelogram would allow [place] to do dynamic images with her standing to see if her stenosis worsens to any substantial degree with positional changes.  Beyond that, we could do a CT scan to better elucidate the nature of her anatomy.  I do believe that Gloria [name] will ultimately need decompression of her L4-5  level.  Where she needs decompression and fusion is the question to answer on the basis of the myelogram and post myelogram CT scan.  We will schedule this at the earliest convenience.    Her tibialis anterior strength on today's exam is good in both lower extremities.  I do not see any obvious signs of any atrophy.  Her reflexes, however, are symmetrically depressed in both the patellae and Achilles.    We will plan on proceeding with the myelogram and post myelogram CT scan at the earliest convenience.         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 She is referred by Dr. Frazier Butt for evaluation of lumbar spine pain. She has had pain since about a year ago, got worse in June 2010. She complains of bilateral L5 radiculopathy, some intermittent numbness and tingling into her toes. The pain is worse with standing, walking, and by the end of the day it is better at night when she lies down. She has had epidural steroid injections interlaminar at L4-5 x2 and a transforaminal on the left, which did help her L5 radiculopathy this past February. These were all done at [facility]. She has been taking  some oxycodone for the pain, but she needs to take Phenergan with that because it causes some nausea. She has been having increasing problems over the course of time. Extension causes pain. She denies any bowel or bladder problems. She has not had any formal physical therapy recently. She has worked with the Systems analyst. She comes in for further treatment, recommendations, and suggestions. She would prefer not to have any surgery if she can avoid so. PAST MEDICAL HISTORY: Positive for hypertension, diabetes, and gastrointestinal reflux disease. PAST SURGICAL HISTORY: Cholecystectomy in 1975 and carotid endarterectomy by  Dr. Madilyn Fireman in 2004. ALLERGIES: CONTRAST DYE CAUSES SHOCK, DARVOCET AND PERCOCET ROTH CAUSE NAUSEA. CURRENT MEDICATIONS: Humalog 5 to 10 units three times a day, Lantus 40 units every night at bedtime, omeprazole 20 mg q. day, losartan 50 mg q. day, metoprolol 25 mg q. day, hydroch 25 mg q. day, aspirin, TriLipix, and Crestor. FAMILY HISTORY: Mother 23 years old in poor health with history of diabetes and myocardial infraction. Father is 48 years old in good health with history of diabetes, heart disease, and cardiovascular disease. Diabetes runs in the family. No bowel or bladder problems. No headaches, convulsion, seizures, or loss of consciousness. SOCIAL HISTORY: She does not smoke tobacco products. Negative alcohol use. No history of substance abuse. She is 5 feet 5 inches and 205 pounds. REVIEW OF SYSTEMS: Review of systems x14 is positive for glasses, hypertension, hypercholesterolemia, back pain, leg pain, arthritis pain, and diabetes. PHYSICAL EXAMINATION: Gloria [name] is a well-developed, well- nourished, 71 year old female. Alert and oriented x3. Cranial nerves II through XII are intact. Full range of motion in bilateral upper and lower extremities. Skin is intact. Ambulates with a fairly normal gait, slightly forward flexed. She is able to go up on her toes and back on her heels. Her motor strength is intact 5/5 to EHL, anterior tibialis, gastroc, quadriceps, iliopsoas, and bilateral lower extremities. Decreased sensation L5 nerve root bilaterally right greater than left. Right-sided centralized low back pain. Negative straight leg raising. Pain with extension. Negative Hoffrnann's. Negative clonus. Good grip strength. Good cervical range of motion. RADIOGRAPHS: Radiographs show some motion at 11.4-5 on flexion and extension consistent with degenerative spondylolisthesis. She does have some short pedicles consistent with congenital spinal stenosis and some facet hypertrophy in the lower lumbar  spine. Her MM shows her discs to be darkened with loss of hythation, but the height is fairly well maintained. She does have synovial cyst on the right at L2-3 that could be impinging the L3 nerve root. She has a couple of millimeters of anterolisthesis at L4-5 consistent with degenerative slip and she has some difihise spinal stenosis centrally while recess and foraminally throughout her lumbar spine consistent with some short pedicles and congenital spinal stenosis. This is found to be moderate. Her foraminal stenosis is moderate at L3-4 and L4-5. IMPRESSION: Low back pain and bilateral L5 radiculopathy with underlying degenerative l anterolisthesis, multilevel spinal stenosis mild-to-moderate, and at L2-3 there is a right sided synovial cyst impinging the L3 nerve root on the right. PLAN: I feel the most of her symptomatology is coming from the dynamic nature of the degenerative slip at I impinging her L5 nerve root bilaterally. At this point in time, she did respond to trnsforaminal injection at L4-5 on the left and now her right leg bothers her more than the left and it is in the L5 nerve root. I think she would benefit from a transforaminal ESI by Dr. Danielle Dess at this time  on the right. We will see how she responds to that. If she has persistent problems with the left, we can then after three to four weeks repeat an injection on the left. We will do one shot at a time rather than two at a time due to her diabetes. She does not know how to manage her sugars if they are elevated from the steroid. She then would benefit from a [employer] Program to work on core strengthening and lumbar stabilization to protect her back from further instability. It would be beneficial to lose some weight and we discussed this and she would like to as long as she can get back to the exercise parameters. She has had a difficult time due to the fact she can exercise due to her pain. She was working with a Patent examiner. We are hopeful that we can manage this conservatively and avoid surgery; however, if she does not respond to these simple things, she may benefit from stabilization and decompression and fUsion of IA- where she is having her most symptomatology.   Pre op Dx: Lumbar stenosis Post op Dx: Lumbar stenosis with left lumbar radiculopathy Procedure: Lumbar myelogram Surgeon: Terrionna Bridwell Puncture level: L3-L4 Fluid color: 10 cc iohexol 180 fluid clear colorless Injection: 10 cc iohexol 180 Findings: Severe stenosis L2-3 and L4-5

## 2012-02-06 ENCOUNTER — Other Ambulatory Visit: Payer: Self-pay | Admitting: Internal Medicine

## 2012-02-06 DIAGNOSIS — R918 Other nonspecific abnormal finding of lung field: Secondary | ICD-10-CM

## 2012-02-09 ENCOUNTER — Ambulatory Visit
Admission: RE | Admit: 2012-02-09 | Discharge: 2012-02-09 | Disposition: A | Discharge status: Home / Self Care | Payer: Medicare Other | Source: Ambulatory Visit | Attending: Internal Medicine | Admitting: Internal Medicine

## 2012-02-09 DIAGNOSIS — R918 Other nonspecific abnormal finding of lung field: Secondary | ICD-10-CM

## 2012-02-17 ENCOUNTER — Other Ambulatory Visit: Payer: Self-pay | Admitting: Neurological Surgery

## 2012-02-18 ENCOUNTER — Encounter (HOSPITAL_COMMUNITY): Payer: Self-pay | Admitting: Pharmacy Technician

## 2012-02-20 ENCOUNTER — Encounter (HOSPITAL_COMMUNITY)
Admission: RE | Admit: 2012-02-20 | Discharge: 2012-02-20 | Disposition: A | Discharge status: Home / Self Care | Payer: Medicare Other | Source: Ambulatory Visit | Attending: Neurological Surgery | Admitting: Neurological Surgery

## 2012-02-20 ENCOUNTER — Encounter (HOSPITAL_COMMUNITY): Payer: Self-pay

## 2012-02-20 HISTORY — DX: Other specified postprocedural states: Z98.890

## 2012-02-20 HISTORY — DX: Gastro-esophageal reflux disease without esophagitis: K21.9

## 2012-02-20 HISTORY — DX: Peripheral vascular disease, unspecified: I73.9

## 2012-02-20 HISTORY — DX: Other specified postprocedural states: R11.2

## 2012-02-20 HISTORY — DX: Essential (primary) hypertension: I10

## 2012-02-20 LAB — CBC
MCH: 29.7 pg (ref 26.0–34.0)
MCV: 92.2 fL (ref 78.0–100.0)
Platelets: 213 10*3/uL (ref 150–400)
RBC: 4.24 MIL/uL (ref 3.87–5.11)
RDW: 13.2 % (ref 11.5–15.5)

## 2012-02-20 LAB — BASIC METABOLIC PANEL
CO2: 25 mEq/L (ref 19–32)
Calcium: 9.6 mg/dL (ref 8.4–10.5)
Creatinine, Ser: 1.72 mg/dL — ABNORMAL HIGH (ref 0.50–1.10)
GFR calc non Af Amer: 29 mL/min — ABNORMAL LOW (ref >90)
Glucose, Bld: 132 mg/dL — ABNORMAL HIGH (ref 70–99)
Sodium: 141 mEq/L (ref 135–145)

## 2012-02-20 LAB — SURGICAL PCR SCREEN: Staphylococcus aureus: NEGATIVE

## 2012-02-20 NOTE — Pre-Procedure Instructions (Addendum)
20 Gloria [name]  02/20/2012   Your procedure is scheduled on:  02/27/12  Report to [facility] at 100 pm   Call this number if you have problems the morning of surgery: [phone]   Remember:   Do not eat food or drink:After Midnight.    Take these medicines the morning of surgery with A SIP OF WATER: metoprolol, omeprazole, tramadol  STOP aspirin, meloxicam, serrepase 02/22/12   Do not wear jewelry, make-up or nail polish.  Do not wear lotions, powders, or perfumes..  Do not shave 48 hours prior to surgery. Men may shave face and neck.  Do not bring valuables to the hospital.  Contacts, dentures or bridgework may not be worn into surgery.  Leave suitcase in the car. After surgery it may be brought to your room.  For patients admitted to the hospital, checkout time is 11:00 AM the day of discharge.   Patients discharged the day of surgery will not be allowed to drive home.  Name and phone number of your driver: Lafonda Mosses [phone]  Special Instructions: Shower using CHG 2 nights before surgery and the night before surgery.  If you shower the day of surgery use CHG.  Use special wash - you have one bottle of CHG for all showers.  You should use approximately 1/3 of the bottle for each shower.   Please read over the following fact sheets that you were given: Pain Booklet, Coughing and Deep Breathing, MRSA Information and Surgical Site Infection Prevention

## 2012-02-20 NOTE — Progress Notes (Signed)
req'd ekg, cxr done 01/28/12 at dr Neale Burly

## 2012-02-23 NOTE — Consult Note (Addendum)
Anesthesia chart review: Patient is a 71 year old female scheduled for L2-3, L4-5 laminectomy, foraminotomy on 02/27/2012 by Dr. Danielle Dess. History includes former smoker, obesity, postoperative nausea vomiting, peripheral vascular disease, GERD, diabetes mellitus type 2, hypertension, pneumonia, right carotid endarterectomy '07 (1-39% RICA, 40-59% LICA 05/28/10).  PCP is Dr. Wylene Simmer at [facility]).  She is a Engineer, civil (consulting).  EKG on 01/28/12 ([facility]) showed SB @ 58 bpm, poor r wave progression in anterior leads.    Cardiac cath on 05/27/05 (Dr. Deborah Chalk) showed: 1. Normal left ventricular function.  2. Mild to moderate coronary atherosclerosis with a 60% ostial narrowing in a bifurcating obtuse marginal and 60% narrowing in the origin of the second diagonal, with minor irregularities otherwise.  3. There is no significant renal artery stenosis.  CXR on 01/28/12 ([facility]) showed poorly defined area of increased density on the lateral radiograph noted posteriorly measuring approximately 5 cm in its greatest dimension. Recommend CT scanning.  CT of the chest without contrast on 02/09/12 showed: 1. Scattered bibasilar pulmonary parenchymal scarring.  2. Pulmonary arterial hypertension.   Labs noted.  BUN/Cr 33/1.72.  Glucose 132.  CBC WNL.  I've requested comparison labs from [facility] and will review when available.    I also reviewed above with Anesthesiologist Dr. Noreene Larsson.  Due to her upcoming surgery, known CAD with multiple co-morbidities, Anesthesiology recommends a Cardiology pre-operative evaluation.  I'll notify Dr. Verlee Rossetti office tomorrow morning since they are currently closed.  Shonna Chock, PA-C 02/23/12 1710  Addendum: 02/24/12 1030 Earlier this morning I notified Jessica at Dr. Verlee Rossetti office of need for cardiology pre-operative evaluation.  Comparison labs received from [facility].  On 01/21/12, her BUN/Cr were 28/1.4.  UA then was WNL. Will plan to repeat BMET on arrival to ensure  stability.    Addendum: 02/26/12 1645 Patient was seen by Cardiologist Dr. Tenny Craw earlier today and had a stress test.  Per note, "Dobutamine myoview today shows no evidence of ischemia Full report to follow  Patient at low risk for major cardiac event surgery OK to proceed tomorrow."

## 2012-02-26 ENCOUNTER — Telehealth: Payer: Self-pay | Admitting: Internal Medicine

## 2012-02-26 ENCOUNTER — Ambulatory Visit (HOSPITAL_BASED_OUTPATIENT_CLINIC_OR_DEPARTMENT_OTHER): Payer: Medicare Other | Admitting: Radiology

## 2012-02-26 ENCOUNTER — Encounter: Payer: Self-pay | Admitting: Internal Medicine

## 2012-02-26 ENCOUNTER — Ambulatory Visit (INDEPENDENT_AMBULATORY_CARE_PROVIDER_SITE_OTHER): Payer: Medicare Other | Admitting: Internal Medicine

## 2012-02-26 VITALS — BP 140/61 | HR 54 | Ht 64.0 in | Wt 218.0 lb

## 2012-02-26 VITALS — BP 143/56 | HR 50 | Ht 64.0 in | Wt 218.0 lb

## 2012-02-26 DIAGNOSIS — I10 Essential (primary) hypertension: Secondary | ICD-10-CM | POA: Insufficient documentation

## 2012-02-26 DIAGNOSIS — Z01810 Encounter for preprocedural cardiovascular examination: Secondary | ICD-10-CM

## 2012-02-26 DIAGNOSIS — R0609 Other forms of dyspnea: Secondary | ICD-10-CM | POA: Insufficient documentation

## 2012-02-26 DIAGNOSIS — I739 Peripheral vascular disease, unspecified: Secondary | ICD-10-CM | POA: Insufficient documentation

## 2012-02-26 DIAGNOSIS — I2510 Atherosclerotic heart disease of native coronary artery without angina pectoris: Secondary | ICD-10-CM

## 2012-02-26 DIAGNOSIS — R0989 Other specified symptoms and signs involving the circulatory and respiratory systems: Secondary | ICD-10-CM | POA: Insufficient documentation

## 2012-02-26 DIAGNOSIS — R0602 Shortness of breath: Secondary | ICD-10-CM

## 2012-02-26 DIAGNOSIS — E119 Type 2 diabetes mellitus without complications: Secondary | ICD-10-CM | POA: Insufficient documentation

## 2012-02-26 DIAGNOSIS — R002 Palpitations: Secondary | ICD-10-CM | POA: Insufficient documentation

## 2012-02-26 MED ORDER — TECHNETIUM TC 99M SESTAMIBI GENERIC - CARDIOLITE
33.0000 | Freq: Once | INTRAVENOUS | Status: AC | PRN
  Administered 2012-02-26: 33 via INTRAVENOUS

## 2012-02-26 MED ORDER — SODIUM CHLORIDE 0.9 % IV SOLN
40.0000 ug/kg | Freq: Once | INTRAVENOUS | Status: AC
  Administered 2012-02-26: 40 ug/kg/min via INTRAVENOUS

## 2012-02-26 MED ORDER — TECHNETIUM TC 99M SESTAMIBI GENERIC - CARDIOLITE
11.0000 | Freq: Once | INTRAVENOUS | Status: AC | PRN
  Administered 2012-02-26: 11 via INTRAVENOUS

## 2012-02-26 MED ORDER — SODIUM CHLORIDE 0.9 % IV SOLN
40.0000 ug/kg | INTRAVENOUS | Status: DC
  Administered 2012-02-26: 40 ug/kg/min via INTRAVENOUS
  Administered 2012-02-26: 16:00:00 via INTRAVENOUS

## 2012-02-26 MED ORDER — ATROPINE SULFATE 0.1 MG/ML IJ SOLN
1.0000 mg | Freq: Once | INTRAMUSCULAR | Status: AC
  Administered 2012-02-26: 1 mg via INTRAVENOUS

## 2012-02-26 MED ORDER — CEFAZOLIN SODIUM-DEXTROSE 2-3 GM-% IV SOLR
2.0000 g | INTRAVENOUS | Status: AC
  Administered 2012-02-27: 2 g via INTRAVENOUS
  Filled 2012-02-26: qty 50

## 2012-02-26 NOTE — Patient Instructions (Addendum)
Your physician has requested that you have a dobutamine myoview. For furth information please visit [email or web address] Please follow instruction sheet, as given.  Your physician recommends that you continue on your current medications as directed. Please refer to the Current Medication list given to you today.

## 2012-02-26 NOTE — Telephone Encounter (Signed)
Shanda Bumps, at Dr. Verlee Rossetti office is aware Gloria [name] was completed & read by Dr. Shirlee Latch. Results were normal study. She stated the anesthesiologist requested consult and can review this in EPIC. Mylo Red RN

## 2012-02-26 NOTE — Progress Notes (Signed)
[facility] SITE 3 NUCLEAR MED [id number] [place] [address] [phone]  Cardiology Nuclear Med Study  Gloria [name] is a 71 y.o. female     MRN : [id number]     DOB: [birth year]  Procedure Date: 02/26/2012  Nuclear Med Background Indication for Stress Test:  Evaluation for Ischemia and Pending Clearance for Lumbar Spine Surgery by Dr. Barnett Abu on 02/27/12 History:  '07 Cath:Mild-moderate CAD, EF=60% Cardiac Risk Factors: Carotid Disease, Family History - CAD, History of Smoking, Hypertension, IDDM Type 1, Lipids, Obesity and PVD  Symptoms:  DOE and Palpitations   Nuclear Pre-Procedure Caffeine/Decaff Intake:  6:30am NPO After: 6:30am   Lungs:  Clear. O2 Sat: 98% on room air. IV 0.9% NS with Angio Cath:  22g  IV Site: R Hand  IV Started by:  Bonnita Levan, RN  Chest Size (in):  44 Cup Size: D  Height: 5\' 4"  (1.626 m)  Weight:  218 lb (98.884 kg)  BMI:  Body mass index is 37.42 kg/(m^2). Tech Comments:  Patient took all Diabetic med's and Lopressor this AM, BS @ 630 AM= 107    Nuclear Med Study 1 or 2 day study: 1 day  Stress Test Type:  Dobutamine  Reading MD: Marca Ancona, MD  Order Authorizing Provider:  Dietrich Pates, MD  Resting Radionuclide: Technetium 83m Sestamibi  Resting Radionuclide Dose: 11.0 mCi   Stress Radionuclide:  Technetium 59m Sestamibi  Stress Radionuclide Dose: 33.0 mCi           Stress Protocol Rest HR: 54 Stress HR: 134  Rest BP: 140/61 Stress BP: 206/32  Exercise Time (min): 14:33 METS: n/a   Predicted Max HR: 149 bpm % Max HR: 89.93 bpm Rate Pressure Product: 24401   Dose of Adenosine (mg):  n/a Dose of Lexiscan: n/a mg  Dose of Atropine (mg): 1.0 Dose of Dobutamine: 40 mcg/kg/min (at max HR)  Stress Test Technologist: Smiley Houseman, CMA-N  Nuclear Technologist:  Domenic Polite, CNMT     Rest Procedure:  Myocardial perfusion imaging was performed at rest 45 minutes following the intravenous  administration of Technetium 26m Sestamibi.  Rest ECG: Sinus bradycardia, otherwise WNL; rare PAC.  Stress Procedure:  The patient received IV dobutamine with 1.0 mg IV atropine.  There were no diagnostic ST-T wave changes with infusion, occasional PVC's and a brief run of junctional rhythm was noted.  Technetium 16m Sestamibi was injected at peak heart rate and quantitative spect images were obtained after a 45 minute delay.  Stress ECG: No significant change from baseline ECG  QPS Raw Data Images:  Normal; no motion artifact; normal heart/lung ratio. Stress Images:  Normal homogeneous uptake in all areas of the myocardium. Rest Images:  Normal homogeneous uptake in all areas of the myocardium. Subtraction (SDS):  There is no evidence of scar or ischemia. Transient Ischemic Dilatation (Normal <1.22):  0.86 Lung/Heart Ratio (Normal <0.45):  0.35  Quantitative Gated Spect Images QGS EDV:  71 ml QGS ESV:  24 ml  Impression Exercise Capacity:  Dobutamine study with isometrics after 40 mcg/kg. BP Response:  Hypertensive blood pressure response. Clinical Symptoms:  Palpitations ECG Impression:  No ischemic changes.  Comparison with Prior Nuclear Study: No previous nuclear study performed  Overall Impression:  Normal stress nuclear study.  LV Ejection Fraction: 66%.  LV Wall Motion:  NL LV Function; NL Wall Motion  Marca Ancona 02/26/2012 4:10 PM

## 2012-02-26 NOTE — Telephone Encounter (Signed)
Please return call to Jessica-Dr. Danielle Dess  [phone] regarding surgical clearance for procedure scheduled 10/11 @ 4pm. Original request for surgical clearance made 02/24/12.

## 2012-02-26 NOTE — Progress Notes (Addendum)
HPI Plan for decompression surgery lumbar spine.  Planned for tomorrow Followed by Dr. Wylene Simmer. Unable to exercise due to back.  Does desk work.  Pain with moving.   Patinet denies CP  Gets SOB with stairs.  Bent over, holding rails. Lives with daughter.  Can't vacuum  Working out with trainer 4 years ago.  NOt much since.  Allergies  Allergen Reactions  . Iohexol      Desc: ANGIO EDEMA W/ ANAPHYLAXIS PRIOR... HIVES POST 13 HR PREP...OK/A.C.   . Oxycodone-Acetaminophen Nausea And Vomiting  . Propoxyphene-Acetaminophen Nausea And Vomiting    Current Outpatient Prescriptions  Medication Sig Dispense Refill  . aspirin 325 MG tablet Take 325 mg by mouth daily. HOLD DUE TO SURGERY      . atorvastatin (LIPITOR) 80 MG tablet Take 80 mg by mouth daily.      . fenofibrate 160 MG tablet Take 160 mg by mouth daily.      . hydrochlorothiazide (HYDRODIURIL) 25 MG tablet Take 25 mg by mouth daily.      . insulin glargine (LANTUS) 100 UNIT/ML injection Inject 30 Units into the skin at bedtime.      . insulin lispro (HUMALOG) 100 UNIT/ML injection Inject 5-20 Units into the skin 3 (three) times daily before meals. Sliding scale       . losartan (COZAAR) 50 MG tablet Take 50 mg by mouth daily.      . meloxicam (MOBIC) 15 MG tablet Take 15 mg by mouth daily. ON HOLD DUE TO SURGERY      . metFORMIN (GLUCOPHAGE) 500 MG tablet Take 500 mg by mouth 3 (three) times daily.      . metoprolol (LOPRESSOR) 50 MG tablet Take 25 mg by mouth 2 (two) times daily.      Marland Kitchen omeprazole (PRILOSEC) 20 MG capsule Take 20 mg by mouth daily.      . traMADol (ULTRAM) 50 MG tablet Take 50 mg by mouth every 4 (four) hours as needed. For pain         Past Medical History  Diagnosis Date  . PONV (postoperative nausea and vomiting)   . Hypertension   . Pneumonia     hx  . Diabetes mellitus   . Peripheral vascular disease     hx rt carotid s/p  . GERD (gastroesophageal reflux disease)   . Arthritis     Past Surgical  History  Procedure Date  . Cholecystectomy 68  . Carotid endarterectomy 68    rt    No family history on file.  History   Social History  . Marital Status: Divorced    Spouse Name: N/A    Number of Children: N/A  . Years of Education: N/A   Occupational History  . Not on file.   Social History Main Topics  . Smoking status: Former Smoker -- 1.0 packs/day for 32 years    Types: Cigarettes    Quit date: 02/20/2004  . Smokeless tobacco: Not on file  . Alcohol Use: No  . Drug Use: No  . Sexually Active: Yes    Birth Control/ Protection: Post-menopausal   Other Topics Concern  . Not on file   Social History Narrative  . No narrative on file    Review of Systems:  All systems reviewed.  They are negative to the above problem except as previously stated.  Vital Signs: BP 143/56  Pulse 50  Ht 5\' 4"  (1.626 m)  Wt 218 lb (98.884 kg)  BMI 37.42  kg/m2  Physical Exam Patient is a morbidly obese 71 year old in NAD HEENT:  Normocephalic, atraumatic. EOMI, PERRLA.  Neck: JVP is normal. S/p R CEA.  L carotid bruit Lungs: clear to auscultation. No rales no wheezes.  Heart: Regular rate and rhythm. Normal S1, S2. No S3.   No significant murmurs. PMI not displaced.  Abdomen:  Supple, nontender. Normal bowel sounds. No masses. No hepatomegaly.  Extremities:   Tr to 1+ PT.. No lower extremity edema.  Musculoskeletal :moving all extremities.  Neuro:   alert and oriented x3.  CN II-XII grossly intact.  EKG:  Sinus bradycardia.  50 bpm. Assessment and Plan:  Patinet is a 71 year old with a history of CAD and PVOD.  She presents for preop evaluation.  She is not having CP but she is very inactive.  I would recom stress testing to risk stratify. Will arrange for dobutamine myoview today.  2.  CAD  As above  3. PVOD.  Patient is followed by Dr. Myra Gianotti  For CV disease.  She does complain of claudication with cramping in calves.  Will get records from [facility]  ? LE doppler done  4.   HL  Will get lipids from Dr. Deneen Harts office  5.  DM.   Hgb A1c per her report was 6.3.  Dietrich Pates  Dobutamine myoview today shows no evidence of ischemia   Full report to follow    Patient at low risk for major cardiac event surgery  OK to proceed tomorrow.  Dietrich Pates 02/26/2012

## 2012-02-27 ENCOUNTER — Ambulatory Visit (HOSPITAL_COMMUNITY): Payer: Medicare Other

## 2012-02-27 ENCOUNTER — Encounter (HOSPITAL_COMMUNITY)
Admission: RE | Disposition: A | Discharge status: Home / Self Care | Payer: Self-pay | Source: Ambulatory Visit | Attending: Neurological Surgery

## 2012-02-27 ENCOUNTER — Ambulatory Visit (HOSPITAL_COMMUNITY): Payer: Medicare Other | Admitting: Vascular Surgery

## 2012-02-27 ENCOUNTER — Encounter (HOSPITAL_COMMUNITY): Payer: Self-pay | Admitting: Vascular Surgery

## 2012-02-27 ENCOUNTER — Encounter (HOSPITAL_COMMUNITY): Payer: Self-pay | Admitting: *Deleted

## 2012-02-27 ENCOUNTER — Inpatient Hospital Stay (HOSPITAL_COMMUNITY)
Admission: RE | Admit: 2012-02-27 | Discharge: 2012-02-28 | DRG: 491 | Disposition: A | Discharge status: Home / Self Care | Payer: Medicare Other | Source: Ambulatory Visit | Attending: Neurological Surgery | Admitting: Neurological Surgery

## 2012-02-27 DIAGNOSIS — Z01812 Encounter for preprocedural laboratory examination: Secondary | ICD-10-CM

## 2012-02-27 DIAGNOSIS — Z79899 Other long term (current) drug therapy: Secondary | ICD-10-CM

## 2012-02-27 DIAGNOSIS — Z23 Encounter for immunization: Secondary | ICD-10-CM

## 2012-02-27 DIAGNOSIS — K219 Gastro-esophageal reflux disease without esophagitis: Secondary | ICD-10-CM | POA: Diagnosis present

## 2012-02-27 DIAGNOSIS — Z794 Long term (current) use of insulin: Secondary | ICD-10-CM

## 2012-02-27 DIAGNOSIS — Z87891 Personal history of nicotine dependence: Secondary | ICD-10-CM

## 2012-02-27 DIAGNOSIS — M47817 Spondylosis without myelopathy or radiculopathy, lumbosacral region: Principal | ICD-10-CM | POA: Diagnosis present

## 2012-02-27 DIAGNOSIS — E119 Type 2 diabetes mellitus without complications: Secondary | ICD-10-CM | POA: Diagnosis present

## 2012-02-27 DIAGNOSIS — Z7982 Long term (current) use of aspirin: Secondary | ICD-10-CM

## 2012-02-27 DIAGNOSIS — M7130 Other bursal cyst, unspecified site: Secondary | ICD-10-CM | POA: Diagnosis present

## 2012-02-27 DIAGNOSIS — I10 Essential (primary) hypertension: Secondary | ICD-10-CM | POA: Diagnosis present

## 2012-02-27 HISTORY — PX: LUMBAR LAMINECTOMY/DECOMPRESSION MICRODISCECTOMY: SHX5026

## 2012-02-27 LAB — GLUCOSE, CAPILLARY
Glucose-Capillary: 132 mg/dL — ABNORMAL HIGH (ref 70–99)
Glucose-Capillary: 197 mg/dL — ABNORMAL HIGH (ref 70–99)

## 2012-02-27 LAB — BASIC METABOLIC PANEL
CO2: 28 mEq/L (ref 19–32)
Glucose, Bld: 85 mg/dL (ref 70–99)
Potassium: 3.7 mEq/L (ref 3.5–5.1)
Sodium: 141 mEq/L (ref 135–145)

## 2012-02-27 SURGERY — LUMBAR LAMINECTOMY/DECOMPRESSION MICRODISCECTOMY 2 LEVELS
Anesthesia: General | Site: Spine Lumbar | Wound class: Clean

## 2012-02-27 MED ORDER — METFORMIN HCL 500 MG PO TABS
500.0000 mg | ORAL_TABLET | Freq: Three times a day (TID) | ORAL | Status: DC
  Administered 2012-02-28: 500 mg via ORAL
  Filled 2012-02-27 (×4): qty 1

## 2012-02-27 MED ORDER — MEPERIDINE HCL 25 MG/ML IJ SOLN: 6.2500 mg | INTRAMUSCULAR | Status: DC | PRN

## 2012-02-27 MED ORDER — ACETAMINOPHEN 650 MG RE SUPP: 650.0000 mg | RECTAL | Status: DC | PRN

## 2012-02-27 MED ORDER — ONDANSETRON HCL 4 MG/2ML IJ SOLN: 4.0000 mg | INTRAMUSCULAR | Status: DC | PRN

## 2012-02-27 MED ORDER — HYDROCHLOROTHIAZIDE 25 MG PO TABS
25.0000 mg | ORAL_TABLET | Freq: Every day | ORAL | Status: DC
  Administered 2012-02-27: 25 mg via ORAL
  Filled 2012-02-27 (×2): qty 1

## 2012-02-27 MED ORDER — HYDROMORPHONE HCL PF 1 MG/ML IJ SOLN
INTRAMUSCULAR | Status: AC
  Administered 2012-02-27: 0.5 mg via INTRAVENOUS
  Filled 2012-02-27: qty 1

## 2012-02-27 MED ORDER — LIDOCAINE HCL (CARDIAC) 20 MG/ML IV SOLN
INTRAVENOUS | Status: DC | PRN
  Administered 2012-02-27: 40 mg via INTRAVENOUS

## 2012-02-27 MED ORDER — INSULIN ASPART 100 UNIT/ML ~~LOC~~ SOLN
5.0000 [IU] | Freq: Three times a day (TID) | SUBCUTANEOUS | Status: DC
  Administered 2012-02-28: 10 [IU] via SUBCUTANEOUS

## 2012-02-27 MED ORDER — BACITRACIN 50000 UNITS IM SOLR
INTRAMUSCULAR | Status: AC
  Filled 2012-02-27: qty 1

## 2012-02-27 MED ORDER — ARTIFICIAL TEARS OP OINT
TOPICAL_OINTMENT | OPHTHALMIC | Status: DC | PRN
  Administered 2012-02-27: 1 via OPHTHALMIC

## 2012-02-27 MED ORDER — ONDANSETRON HCL 4 MG/2ML IJ SOLN
INTRAMUSCULAR | Status: DC | PRN
  Administered 2012-02-27: 4 mg via INTRAVENOUS

## 2012-02-27 MED ORDER — FENTANYL CITRATE 0.05 MG/ML IJ SOLN
INTRAMUSCULAR | Status: DC | PRN
  Administered 2012-02-27: 250 ug via INTRAVENOUS

## 2012-02-27 MED ORDER — INSULIN LISPRO 100 UNIT/ML ~~LOC~~ SOLN: 5.0000 [IU] | Freq: Three times a day (TID) | SUBCUTANEOUS | Status: DC

## 2012-02-27 MED ORDER — SODIUM CHLORIDE 0.9 % IJ SOLN: 3.0000 mL | Freq: Two times a day (BID) | INTRAMUSCULAR | Status: DC

## 2012-02-27 MED ORDER — FENOFIBRATE 160 MG PO TABS
160.0000 mg | ORAL_TABLET | Freq: Every day | ORAL | Status: DC
  Administered 2012-02-27: 160 mg via ORAL
  Filled 2012-02-27 (×2): qty 1

## 2012-02-27 MED ORDER — PROPOFOL 10 MG/ML IV BOLUS
INTRAVENOUS | Status: DC | PRN
  Administered 2012-02-27: 100 mg via INTRAVENOUS

## 2012-02-27 MED ORDER — INSULIN GLARGINE 100 UNIT/ML ~~LOC~~ SOLN
30.0000 [IU] | Freq: Every day | SUBCUTANEOUS | Status: DC
  Administered 2012-02-27: 30 [IU] via SUBCUTANEOUS

## 2012-02-27 MED ORDER — SODIUM CHLORIDE 0.9 % IV SOLN: 250.0000 mL | INTRAVENOUS | Status: DC

## 2012-02-27 MED ORDER — CEFAZOLIN SODIUM 1-5 GM-% IV SOLN
1.0000 g | Freq: Three times a day (TID) | INTRAVENOUS | Status: AC
  Administered 2012-02-27 – 2012-02-28 (×2): 1 g via INTRAVENOUS
  Filled 2012-02-27 (×2): qty 50

## 2012-02-27 MED ORDER — OXYCODONE HCL 5 MG PO TABS
5.0000 mg | ORAL_TABLET | Freq: Once | ORAL | Status: AC | PRN
  Administered 2012-02-27: 5 mg via ORAL

## 2012-02-27 MED ORDER — BUPIVACAINE HCL (PF) 0.5 % IJ SOLN
INTRAMUSCULAR | Status: DC | PRN
  Administered 2012-02-27: 5 mL
  Administered 2012-02-27: 25 mL

## 2012-02-27 MED ORDER — OXYCODONE HCL 5 MG/5ML PO SOLN: 5.0000 mg | Freq: Once | ORAL | Status: AC | PRN

## 2012-02-27 MED ORDER — DIPHENHYDRAMINE HCL 25 MG PO CAPS
50.0000 mg | ORAL_CAPSULE | ORAL | Status: DC | PRN
  Administered 2012-02-27 – 2012-02-28 (×2): 50 mg via ORAL
  Filled 2012-02-27: qty 1
  Filled 2012-02-27: qty 2

## 2012-02-27 MED ORDER — MORPHINE SULFATE 2 MG/ML IJ SOLN: 1.0000 mg | INTRAMUSCULAR | Status: DC | PRN

## 2012-02-27 MED ORDER — LIDOCAINE-EPINEPHRINE 1 %-1:100000 IJ SOLN
INTRAMUSCULAR | Status: DC | PRN
  Administered 2012-02-27: 5 mL

## 2012-02-27 MED ORDER — INSULIN ASPART 100 UNIT/ML ~~LOC~~ SOLN: 2.0000 [IU] | SUBCUTANEOUS | Status: DC

## 2012-02-27 MED ORDER — SODIUM CHLORIDE 0.9 % IR SOLN
Status: DC | PRN
  Administered 2012-02-27: 17:00:00

## 2012-02-27 MED ORDER — PANTOPRAZOLE SODIUM 40 MG PO TBEC: 40.0000 mg | DELAYED_RELEASE_TABLET | Freq: Every day | ORAL | Status: DC

## 2012-02-27 MED ORDER — LOSARTAN POTASSIUM 50 MG PO TABS
50.0000 mg | ORAL_TABLET | Freq: Every day | ORAL | Status: DC
  Administered 2012-02-27: 50 mg via ORAL
  Filled 2012-02-27 (×2): qty 1

## 2012-02-27 MED ORDER — HYDROMORPHONE HCL PF 1 MG/ML IJ SOLN
0.2500 mg | INTRAMUSCULAR | Status: DC | PRN
  Administered 2012-02-27 (×4): 0.5 mg via INTRAVENOUS

## 2012-02-27 MED ORDER — MENTHOL 3 MG MT LOZG: 1.0000 | LOZENGE | OROMUCOSAL | Status: DC | PRN

## 2012-02-27 MED ORDER — NEOSTIGMINE METHYLSULFATE 1 MG/ML IJ SOLN
INTRAMUSCULAR | Status: DC | PRN
  Administered 2012-02-27: 3 mg via INTRAVENOUS

## 2012-02-27 MED ORDER — OXYCODONE HCL 5 MG PO TABS
ORAL_TABLET | ORAL | Status: AC
  Filled 2012-02-27: qty 1

## 2012-02-27 MED ORDER — MIDAZOLAM HCL 2 MG/2ML IJ SOLN: 0.5000 mg | Freq: Once | INTRAMUSCULAR | Status: DC | PRN

## 2012-02-27 MED ORDER — DEXAMETHASONE SODIUM PHOSPHATE 4 MG/ML IJ SOLN
INTRAMUSCULAR | Status: DC | PRN
  Administered 2012-02-27: 4 mg via INTRAVENOUS

## 2012-02-27 MED ORDER — PHENOL 1.4 % MT LIQD: 1.0000 | OROMUCOSAL | Status: DC | PRN

## 2012-02-27 MED ORDER — DIPHENHYDRAMINE HCL 50 MG/ML IJ SOLN
50.0000 mg | Freq: Four times a day (QID) | INTRAMUSCULAR | Status: DC | PRN
  Filled 2012-02-27 (×2): qty 1

## 2012-02-27 MED ORDER — ALUM & MAG HYDROXIDE-SIMETH 200-200-20 MG/5ML PO SUSP: 30.0000 mL | Freq: Four times a day (QID) | ORAL | Status: DC | PRN

## 2012-02-27 MED ORDER — DIAZEPAM 5 MG PO TABS
ORAL_TABLET | ORAL | Status: AC
  Filled 2012-02-27: qty 1

## 2012-02-27 MED ORDER — OXYCODONE-ACETAMINOPHEN 5-325 MG PO TABS: 1.0000 | ORAL_TABLET | ORAL | Status: DC | PRN

## 2012-02-27 MED ORDER — TRAMADOL HCL 50 MG PO TABS
50.0000 mg | ORAL_TABLET | ORAL | Status: DC | PRN
  Filled 2012-02-27: qty 1

## 2012-02-27 MED ORDER — ATORVASTATIN CALCIUM 80 MG PO TABS
80.0000 mg | ORAL_TABLET | Freq: Every day | ORAL | Status: DC
  Administered 2012-02-27: 80 mg via ORAL
  Filled 2012-02-27 (×2): qty 1

## 2012-02-27 MED ORDER — GLYCOPYRROLATE 0.2 MG/ML IJ SOLN
INTRAMUSCULAR | Status: DC | PRN
  Administered 2012-02-27: 0.4 mg via INTRAVENOUS

## 2012-02-27 MED ORDER — ACETAMINOPHEN 325 MG PO TABS: 650.0000 mg | ORAL_TABLET | ORAL | Status: DC | PRN

## 2012-02-27 MED ORDER — LACTATED RINGERS IV SOLN
INTRAVENOUS | Status: DC | PRN
  Administered 2012-02-27 (×2): via INTRAVENOUS

## 2012-02-27 MED ORDER — HEMOSTATIC AGENTS (NO CHARGE) OPTIME
TOPICAL | Status: DC | PRN
  Administered 2012-02-27: 1 via TOPICAL

## 2012-02-27 MED ORDER — SODIUM CHLORIDE 0.9 % IV SOLN
INTRAVENOUS | Status: AC
  Filled 2012-02-27: qty 500

## 2012-02-27 MED ORDER — THROMBIN 5000 UNITS EX KIT
PACK | CUTANEOUS | Status: DC | PRN
  Administered 2012-02-27 (×2): 5000 [IU] via TOPICAL

## 2012-02-27 MED ORDER — KETOROLAC TROMETHAMINE 15 MG/ML IJ SOLN
15.0000 mg | Freq: Four times a day (QID) | INTRAMUSCULAR | Status: DC
  Administered 2012-02-27 – 2012-02-28 (×2): 15 mg via INTRAVENOUS
  Filled 2012-02-27 (×4): qty 1

## 2012-02-27 MED ORDER — SODIUM CHLORIDE 0.9 % IJ SOLN: 3.0000 mL | INTRAMUSCULAR | Status: DC | PRN

## 2012-02-27 MED ORDER — ROCURONIUM BROMIDE 100 MG/10ML IV SOLN
INTRAVENOUS | Status: DC | PRN
  Administered 2012-02-27: 50 mg via INTRAVENOUS

## 2012-02-27 MED ORDER — PROMETHAZINE HCL 25 MG/ML IJ SOLN: 6.2500 mg | INTRAMUSCULAR | Status: DC | PRN

## 2012-02-27 MED ORDER — METOPROLOL TARTRATE 25 MG PO TABS
25.0000 mg | ORAL_TABLET | Freq: Two times a day (BID) | ORAL | Status: DC
  Administered 2012-02-27: 25 mg via ORAL
  Filled 2012-02-27 (×3): qty 1

## 2012-02-27 MED ORDER — DIAZEPAM 5 MG PO TABS
5.0000 mg | ORAL_TABLET | Freq: Four times a day (QID) | ORAL | Status: DC | PRN
  Administered 2012-02-27: 5 mg via ORAL

## 2012-02-27 MED ORDER — MIDAZOLAM HCL 5 MG/5ML IJ SOLN
INTRAMUSCULAR | Status: DC | PRN
  Administered 2012-02-27: 2 mg via INTRAVENOUS

## 2012-02-27 MED ORDER — 0.9 % SODIUM CHLORIDE (POUR BTL) OPTIME
TOPICAL | Status: DC | PRN
  Administered 2012-02-27: 1000 mL

## 2012-02-27 MED ORDER — HYDROMORPHONE HCL PF 1 MG/ML IJ SOLN
INTRAMUSCULAR | Status: AC
  Filled 2012-02-27: qty 1

## 2012-02-27 SURGICAL SUPPLY — 54 items
ADH SKN CLS APL DERMABOND .7 (GAUZE/BANDAGES/DRESSINGS) ×1
BAG DECANTER FOR FLEXI CONT (MISCELLANEOUS) ×2 IMPLANT
BLADE SURG ROTATE 9660 (MISCELLANEOUS) IMPLANT
BUR ACORN 6.0 (BURR) ×1 IMPLANT
BUR MATCHSTICK NEURO 3.0 LAGG (BURR) ×2 IMPLANT
CANISTER SUCTION 2500CC (MISCELLANEOUS) ×2 IMPLANT
CLOTH BEACON ORANGE TIMEOUT ST (SAFETY) ×2 IMPLANT
CONT SPEC 4OZ CLIKSEAL STRL BL (MISCELLANEOUS) ×2 IMPLANT
DECANTER SPIKE VIAL GLASS SM (MISCELLANEOUS) ×1 IMPLANT
DERMABOND ADVANCED (GAUZE/BANDAGES/DRESSINGS) ×1
DERMABOND ADVANCED .7 DNX12 (GAUZE/BANDAGES/DRESSINGS) ×1 IMPLANT
DRAPE LAPAROTOMY 100X72X124 (DRAPES) ×2 IMPLANT
DRAPE MICROSCOPE LEICA (MISCELLANEOUS) ×1 IMPLANT
DRAPE POUCH INSTRU U-SHP 10X18 (DRAPES) ×2 IMPLANT
DRAPE PROXIMA HALF (DRAPES) ×1 IMPLANT
DURAPREP 26ML APPLICATOR (WOUND CARE) ×2 IMPLANT
ELECT REM PT RETURN 9FT ADLT (ELECTROSURGICAL) ×2
ELECTRODE REM PT RTRN 9FT ADLT (ELECTROSURGICAL) ×1 IMPLANT
GAUZE SPONGE 4X4 16PLY XRAY LF (GAUZE/BANDAGES/DRESSINGS) IMPLANT
GLOVE BIO SURGEON STRL SZ8 (GLOVE) ×1 IMPLANT
GLOVE BIOGEL PI IND STRL 7.5 (GLOVE) IMPLANT
GLOVE BIOGEL PI IND STRL 8.5 (GLOVE) ×1 IMPLANT
GLOVE BIOGEL PI INDICATOR 7.5 (GLOVE) ×1
GLOVE BIOGEL PI INDICATOR 8.5 (GLOVE) ×2
GLOVE ECLIPSE 7.0 STRL STRAW (GLOVE) ×2 IMPLANT
GLOVE ECLIPSE 8.5 STRL (GLOVE) ×2 IMPLANT
GLOVE EXAM NITRILE LRG STRL (GLOVE) IMPLANT
GLOVE EXAM NITRILE MD LF STRL (GLOVE) IMPLANT
GLOVE EXAM NITRILE XL STR (GLOVE) IMPLANT
GLOVE EXAM NITRILE XS STR PU (GLOVE) IMPLANT
GOWN BRE IMP SLV AUR LG STRL (GOWN DISPOSABLE) IMPLANT
GOWN BRE IMP SLV AUR XL STRL (GOWN DISPOSABLE) ×2 IMPLANT
GOWN STRL REIN 2XL LVL4 (GOWN DISPOSABLE) ×2 IMPLANT
KIT BASIN OR (CUSTOM PROCEDURE TRAY) ×2 IMPLANT
KIT ROOM TURNOVER OR (KITS) ×2 IMPLANT
LIDOCAINE IMPLANT
NDL SPNL 20GX3.5 QUINCKE YW (NEEDLE) IMPLANT
NEEDLE HYPO 22GX1.5 SAFETY (NEEDLE) ×2 IMPLANT
NEEDLE SPNL 20GX3.5 QUINCKE YW (NEEDLE) ×2 IMPLANT
NS IRRIG 1000ML POUR BTL (IV SOLUTION) ×2 IMPLANT
PACK LAMINECTOMY NEURO (CUSTOM PROCEDURE TRAY) ×2 IMPLANT
PAD ARMBOARD 7.5X6 YLW CONV (MISCELLANEOUS) ×6 IMPLANT
PATTIES SURGICAL .5 X1 (DISPOSABLE) ×2 IMPLANT
RUBBERBAND STERILE (MISCELLANEOUS) IMPLANT
SPONGE GAUZE 4X4 12PLY (GAUZE/BANDAGES/DRESSINGS) ×2 IMPLANT
SPONGE SURGIFOAM ABS GEL SZ50 (HEMOSTASIS) ×2 IMPLANT
SUT VIC AB 1 CT1 18XBRD ANBCTR (SUTURE) ×1 IMPLANT
SUT VIC AB 1 CT1 8-18 (SUTURE) ×2
SUT VIC AB 2-0 CP2 18 (SUTURE) ×2 IMPLANT
SUT VIC AB 3-0 SH 8-18 (SUTURE) ×3 IMPLANT
SYR 20ML ECCENTRIC (SYRINGE) ×2 IMPLANT
TOWEL OR 17X24 6PK STRL BLUE (TOWEL DISPOSABLE) ×2 IMPLANT
TOWEL OR 17X26 10 PK STRL BLUE (TOWEL DISPOSABLE) ×2 IMPLANT
WATER STERILE IRR 1000ML POUR (IV SOLUTION) ×2 IMPLANT

## 2012-02-27 NOTE — Anesthesia Preprocedure Evaluation (Addendum)
Anesthesia Evaluation  Patient identified by MRN, date of birth, ID band Patient awake    Reviewed: Allergy & Precautions, H&P , NPO status , Patient's Chart, lab work & pertinent test results, reviewed documented beta blocker date and time   History of Anesthesia Complications (+) PONV  Airway Mallampati: I TM Distance: >3 FB Neck ROM: Full    Dental  (+) Teeth Intact and Dental Advisory Given   Pulmonary pneumonia -, resolved, former smoker,    Pulmonary exam normal       Cardiovascular hypertension, Pt. on medications and Pt. 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)     Musculoskeletal  (+) Arthritis -, Osteoarthritis,    Abdominal (+) + obese,   Peds  Hematology negative hematology ROS (+)   Anesthesia Other Findings H/o angioedema to stress  Reproductive/Obstetrics                          Anesthesia Physical Anesthesia Plan  ASA: III  Anesthesia Plan: General   Post-op Pain Management:    Induction: Intravenous  Airway Management Planned: Oral ETT  Additional Equipment:   Intra-op Plan:   Post-operative Plan: Extubation in OR  Informed Consent: I have reviewed the patients History and Physical, chart, labs and discussed the procedure including the risks, benefits and alternatives for the proposed anesthesia with the patient or authorized representative who has indicated his/her understanding and acceptance.   Dental  advisory given  Plan Discussed with: CRNA and Surgeon  Anesthesia Plan Comments: (Plan routine monitors, GETA)        Anesthesia Quick Evaluation

## 2012-02-27 NOTE — Anesthesia Postprocedure Evaluation (Signed)
  Anesthesia Post-op Note  Patient: Gloria [name]  Procedure(s) Performed: Procedure(s) (LRB) with comments: LUMBAR LAMINECTOMY/DECOMPRESSION MICRODISCECTOMY 2 LEVELS (N/A) - Lumbar two-three, four-five Laminectomy/foraminotomy  Patient Location: PACU  Anesthesia Type: General  Level of Consciousness: awake  Airway and Oxygen Therapy: Patient Spontanous Breathing  Post-op Pain: mild  Post-op Assessment: Post-op Vital signs reviewed, Patient's Cardiovascular Status Stable, Respiratory Function Stable, Patent Airway, No signs of Nausea or vomiting and Pain level controlled  Post-op Vital Signs: stable  Complications: No apparent anesthesia complications

## 2012-02-27 NOTE — Preoperative (Signed)
Beta Blockers   Reason not to administer Beta Blockers:Not Applicable 

## 2012-02-27 NOTE — Transfer of Care (Signed)
Immediate Anesthesia Transfer of Care Note  Patient: Gloria [name]  Procedure(s) Performed: Procedure(s) (LRB) with comments: LUMBAR LAMINECTOMY/DECOMPRESSION MICRODISCECTOMY 2 LEVELS (N/A) - Lumbar two-three, four-five Laminectomy/foraminotomy  Patient Location: PACU  Anesthesia Type: General  Level of Consciousness: awake, alert  and oriented  Airway & Oxygen Therapy: Patient Spontanous Breathing and Patient connected to nasal cannula oxygen  Post-op Assessment: Report given to PACU RN and Post -op Vital signs reviewed and stable  Post vital signs: Reviewed and stable  Complications: No apparent anesthesia complications

## 2012-02-27 NOTE — H&P (Signed)
Gloria [name] is an 71 y.o. female.   Chief Complaint: Back and lower extremity pain HPI: 02/11/2012:  Gloria [name] had a myelogram on September 17 .  The study demonstrates that she has two areas of concern.  Gloria [name] has primarily left lumbar radicular symptoms.  Interestingly, the worst pathology occurs on the right side at L4-L5 where she has a substantial synovial cyst that causes central canal stenosis from the right side.  It does not appear to impede the left-sided nerve roots themselves and truly Gloria [name] does not have any right-sided symptoms.  The other finding that I do believe does attribute her left-sided symptoms is at L2-L3 where Gloria [name] has central canal stenosis that is quite severe secondary to broad-based bulge of the disc posteriorly, but also due to facet hypertrophy and intraspinous ligament hypertrophy that is advanced.  I noted that the disc and the alignment of her spine do not appear to be unstable.  She has no gross motion across these segments.  I believe that Gloria [name] would benefit from decompression of the central canal and particularly the right side and the left side at L2-L3.  I indicated that we can do this pretty much through a right-sided approach to decompress both L2-3 and also decompress L4-5.  We would not consider doing any fusion at this time and I don't believe that Gloria [name] will likely require this for the future.  Overall, I believe that she should do quite reasonably well with the decompression alone.   Past Medical History  Diagnosis Date  . PONV (postoperative nausea and vomiting)   . Hypertension   . Pneumonia     hx  . Diabetes mellitus   . Peripheral vascular disease     hx rt carotid s/p  . GERD (gastroesophageal reflux disease)   . Arthritis     Past Surgical History  Procedure Date  . Cholecystectomy 68  . Carotid endarterectomy 68    rt    No family history on file. Social History:  reports that she quit smoking about 8 years ago. Her smoking use  included Cigarettes. She has a 32 pack-year smoking history. She does not have any smokeless tobacco history on file. She reports that she does not drink alcohol or use illicit drugs.  Allergies:  Allergies  Allergen Reactions  . Iohexol      Desc: ANGIO EDEMA W/ ANAPHYLAXIS PRIOR... HIVES POST 13 HR PREP...OK/A.C.   . Oxycodone-Acetaminophen Nausea And Vomiting  . Propoxyphene-Acetaminophen Nausea And Vomiting    No prescriptions prior to admission    No results found for this or any previous visit (from the past 48 hour(s)). No results found.  Review of Systems  HENT: Negative.   Eyes: Negative.   Respiratory: Negative.   Cardiovascular: Negative.   Gastrointestinal: Negative.   Genitourinary: Negative.   Musculoskeletal: Positive for back pain.  Skin: Negative.   Neurological: Positive for tingling, sensory change, focal weakness and weakness.  Endo/Heme/Allergies: Negative.   Psychiatric/Behavioral: Negative.     There were no vitals taken for this visit. Physical Exam  Constitutional: She is oriented to person, place, and time. She appears well-developed and well-nourished.  HENT:  Head: Normocephalic and atraumatic.  Eyes: Conjunctivae normal and EOM are normal. Pupils are equal, round, and reactive to light.  Neck: Normal range of motion. Neck supple.  Cardiovascular: Normal rate, regular rhythm and normal heart sounds.   Respiratory: Effort normal and breath sounds normal.  GI: Soft. Bowel sounds are normal.  Musculoskeletal:  Positive straight leg raising bilaterally at 45. Tenderness in both posterior superior iliac crest.  Neurological: She is alert and oriented to person, place, and time. She has normal reflexes. She exhibits normal muscle tone. Coordination normal.  Skin: Skin is warm and dry.  Psychiatric: She has a normal mood and affect. Her behavior is normal. Judgment and thought content normal.     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

## 2012-02-27 NOTE — Op Note (Signed)
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 With operating microscope microdissection technique Surgeon: Barnett Abu Assistant: Maeola Harman Anesthesia: Gen. endotracheal Indications: Patient is a 71 year old female who has had significant problems with chronic left lumbar radiculopathy in addition to significant back pain she is advanced spondylosis with a right-sided synovial cyst at L4-L5 lateral recess stenosis bilaterally at L4-5 and L2-3 central canal stenosis with severe at L2-3 she's been advised regarding surgical decompression of these 2 levels is now taken to the operating room for same.   Procedure: Patient was brought to the operating room supine on a stretcher after the smooth induction of general endotracheal anesthesia neck was prepped with alcohol and DuraPrep and draped in a sterile fashion M. midline approach was taken and needles were used to localize L2-3 and L4-5 but in an initial x-ray incisions were made over the areas chosen after infiltrating with 10 cc of lidocaine with epinephrine mixed 50-50 with half percent Marcaine. The dissection was carried down on the right side to expose the interlaminar space of L4-5 L2-3 then by exposing the arch of the spinous process and medial portion of the lamina a large laminotomy was created at L4-L5 we approached the synovial cyst on the right side through this aperture and could identify the borders of the cyst then by teasing away from the dura revealed resected in a piecemeal fashion in undercut the medial aspect of the facet joint by about one quarter of the width of the joint. This allowed for good decompression of the common dural tube  and the right side nerve root the left side was decompressed by working laterally towards the left side to this right-sided approach. Further portion of the inferior margin of the spinous process and laminar arch was resected and the yellow ligament was taken up out to the opposite side. The left-sided common dural tube and nerve root was decompressed.  Attention was then turned to the area of L2-L3 initial laminotomy was created again by removing the inferior portion of the spinous process and the medial portion of the lamina the yellow ligament was markedly thickened and redundant and this area was resected in a piecemeal fashion until we were able to observe the central canal dura dissection was then enlarged over to the right side to explore the region of the lateral recess redundant ligamentous material was resected from this region. The common dural tube was decompressed centrally and in the lateral gutter. Once this was accomplished the common dural tube could be palpated to be free and clear in its travel across this stenotic region. Hemostasis was then meticulously achieved in the surrounding areas and once adequate the lumbar dorsal fascia was closed in each opening with #1 Vicryl in interrupted fashion 2-0 Vicryl was used in the subcutaneous tissues tear Vicryl subcuticularly foreclosing the subcuticular skin total of 20 cc of half percent Marcaine was injected into the paraspinous musculature on both incisions. Patient tolerated procedure well blood loss was estimated at 100 cc.

## 2012-02-28 MED ORDER — INFLUENZA VIRUS VACC SPLIT PF IM SUSP
0.5000 mL | INTRAMUSCULAR | Status: AC
  Administered 2012-02-28: 0.5 mL via INTRAMUSCULAR
  Filled 2012-02-28: qty 0.5

## 2012-02-28 MED ORDER — HYDROCODONE-ACETAMINOPHEN 5-325 MG PO TABS: 1.0000 | ORAL_TABLET | ORAL | Status: DC | PRN

## 2012-02-28 MED ORDER — DIAZEPAM 5 MG PO TABS: 5.0000 mg | ORAL_TABLET | Freq: Four times a day (QID) | ORAL | Status: DC | PRN

## 2012-02-28 NOTE — Progress Notes (Signed)
Subjective: Patient reports doing well  Objective: Vital signs in last 24 hours: Temp:  [98.1 F (36.7 C)-98.8 F (37.1 C)] 98.5 F (36.9 C) (10/12 0802) Pulse Rate:  [56-80] 66  (10/12 0802) Resp:  [10-20] 18  (10/12 0802) BP: (108-160)/(44-84) 116/84 mmHg (10/12 0802) SpO2:  [90 %-96 %] 92 % (10/12 0802)  Intake/Output from previous day: 10/11 0701 - 10/12 0700 In: 1600 [I.V.:1600] Out: 101 [Urine:1; Blood:100] Intake/Output this shift:    Physical Exam: Dressing CDI.  Strength full.  No numbness  Lab Results: No results found for this basename: WBC:2,HGB:2,HCT:2,PLT:2 in the last 72 hours BMET  Basename 02/27/12 1308  NA 141  K 3.7  CL 102  CO2 28  GLUCOSE 85  BUN 23  CREATININE 1.29*  CALCIUM 9.4    Studies/Results: Dg Lumbar Spine 2-3 Views  02/27/2012  *RADIOLOGY REPORT*  Clinical Data: L2-L3 and L4-L5 laminectomy.  LUMBAR SPINE - 2-3 VIEW  Comparison: CT of the lumbar spine 01/24/2012.  Findings: Two intraoperative cross-table lateral views of the lumbar spine are submitted for evaluation.  Film #1 demonstrates a localization needles in place posterior to L3 and posterior to the L4-L5 interspace.  The film labeled #2 demonstrates soft tissue retractors and surgical probes in place posterior to the L2-L3 and L4-L5 interspaces.  Extensive atherosclerotic calcifications are incidentally noted.  IMPRESSION: 1.  Intraoperative localization, as above.   Original Report Authenticated By: Florencia Reasons, M.D.     Assessment/Plan: D/C home. Doing well.    LOS: 1 day    Dorian Heckle, MD 02/28/2012, 8:43 AM

## 2012-02-28 NOTE — Progress Notes (Signed)
Physician Discharge Summary  Patient ID: Gloria [name] MRN: [id number] DOB/AGE: August 09, 1940 71 y.o.  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 *    Discharged Condition: good  Hospital Course: Uncomplicated laminectomy for stenosis and resection of synovial cyst  Consults: None  Significant Diagnostic Studies: None  Treatments: surgery:Uncomplicated laminectomy for stenosis and resection of synovial cyst  Discharge Exam: Blood pressure 116/84, pulse 66, temperature 98.5 F (36.9 C), temperature source Oral, resp. rate 18, SpO2 92.00%. Neurologic: Alert and oriented X 3, normal strength and tone. Normal symmetric reflexes. Normal coordination and gait Wound:CDI  Disposition: Home     Medication List     As of 02/28/2012  8:41 AM    ASK your doctor about these medications         aspirin 325 MG tablet   Take 325 mg by mouth daily. HOLD DUE TO SURGERY      atorvastatin 80 MG tablet   Commonly known as: LIPITOR   Take 80 mg by mouth daily.      fenofibrate 160 MG tablet   Take 160 mg by mouth daily.      hydrochlorothiazide 25 MG tablet   Commonly known as: HYDRODIURIL   Take 25 mg by mouth daily.      insulin glargine 100 UNIT/ML injection   Commonly known as: LANTUS   Inject 30 Units into the skin at bedtime.      insulin lispro 100 UNIT/ML injection   Commonly known as: HUMALOG   Inject 5-20 Units into the skin 3 (three) times daily before meals. Sliding scale      losartan 50 MG tablet   Commonly known as: COZAAR   Take 50 mg by mouth daily.      meloxicam 15 MG tablet   Commonly known as: MOBIC   Take 15 mg by mouth daily. ON HOLD DUE TO SURGERY      metFORMIN 500 MG tablet   Commonly  known as: GLUCOPHAGE   Take 500 mg by mouth 3 (three) times daily.      metoprolol 50 MG tablet   Commonly known as: LOPRESSOR   Take 25 mg by mouth 2 (two) times daily.      omeprazole 20 MG capsule   Commonly known as: PRILOSEC   Take 20 mg by mouth daily.      traMADol 50 MG tablet   Commonly known as: ULTRAM   Take 50 mg by mouth every 4 (four) hours as needed. For pain         Signed: Dorian Heckle, MD 02/28/2012, 8:41 AM

## 2012-03-01 ENCOUNTER — Encounter (HOSPITAL_COMMUNITY): Payer: Self-pay | Admitting: Neurological Surgery

## 2012-03-23 ENCOUNTER — Ambulatory Visit (HOSPITAL_COMMUNITY)
Admission: RE | Admit: 2012-03-23 | Discharge: 2012-03-23 | Disposition: A | Discharge status: Home / Self Care | Payer: Medicare Other | Source: Ambulatory Visit | Attending: Neurological Surgery | Admitting: Neurological Surgery

## 2012-03-23 DIAGNOSIS — R52 Pain, unspecified: Secondary | ICD-10-CM

## 2012-03-23 DIAGNOSIS — M79609 Pain in unspecified limb: Secondary | ICD-10-CM | POA: Insufficient documentation

## 2012-03-23 DIAGNOSIS — M7989 Other specified soft tissue disorders: Secondary | ICD-10-CM

## 2012-03-23 DIAGNOSIS — R609 Edema, unspecified: Secondary | ICD-10-CM

## 2012-03-23 NOTE — Progress Notes (Signed)
*  Preliminary Results* Left lower extremity venous duplex completed. Left lower extremity is negative for deep vein thrombosis.   03/23/2012 3:53 PM Gertie Fey, RDMS, RDCS

## 2012-05-14 ENCOUNTER — Encounter: Payer: Self-pay | Admitting: Vascular Surgery

## 2012-05-28 ENCOUNTER — Other Ambulatory Visit (HOSPITAL_COMMUNITY): Payer: Self-pay | Admitting: Neurological Surgery

## 2012-05-30 ENCOUNTER — Other Ambulatory Visit (HOSPITAL_COMMUNITY): Payer: Self-pay | Admitting: Neurological Surgery

## 2012-06-11 ENCOUNTER — Other Ambulatory Visit: Payer: Self-pay | Admitting: *Deleted

## 2012-06-11 DIAGNOSIS — I6529 Occlusion and stenosis of unspecified carotid artery: Secondary | ICD-10-CM

## 2012-06-11 DIAGNOSIS — Z48812 Encounter for surgical aftercare following surgery on the circulatory system: Secondary | ICD-10-CM

## 2012-06-15 ENCOUNTER — Encounter: Payer: Self-pay | Admitting: Neurosurgery

## 2012-06-23 ENCOUNTER — Encounter: Payer: Self-pay | Admitting: Neurosurgery

## 2012-06-24 ENCOUNTER — Ambulatory Visit (INDEPENDENT_AMBULATORY_CARE_PROVIDER_SITE_OTHER): Payer: Medicare Other | Admitting: Neurosurgery

## 2012-06-24 ENCOUNTER — Other Ambulatory Visit (INDEPENDENT_AMBULATORY_CARE_PROVIDER_SITE_OTHER): Payer: Medicare Other | Admitting: Vascular Surgery

## 2012-06-24 ENCOUNTER — Encounter: Payer: Self-pay | Admitting: Neurosurgery

## 2012-06-24 VITALS — BP 146/55 | HR 59 | Resp 16 | Ht 65.0 in | Wt 207.0 lb

## 2012-06-24 DIAGNOSIS — I6529 Occlusion and stenosis of unspecified carotid artery: Secondary | ICD-10-CM

## 2012-06-24 DIAGNOSIS — Z48812 Encounter for surgical aftercare following surgery on the circulatory system: Secondary | ICD-10-CM

## 2012-06-24 NOTE — Progress Notes (Signed)
[facility] Carotid Office Note  CC: Carotid surveillance Referring Physician: Brabham  History of Present Illness:  72 year old female patient of Dr. Myra Gianotti who is status post right CEA in 2007 with Dr. Madilyn Fireman. The patient denies any signs or symptoms of CVA, TIA, amaurosis fugax. The patient states she did have a lumbar decompression with Dr. Danielle Dess and October 2013 has some residual left leg swelling that has been interrogated with Doppler and they  found no vascular problem or DVT and she continues to work with Dr. Danielle Dess regarding this problem.  Past Medical History  Diagnosis Date  . PONV (postoperative nausea and vomiting)   . Hypertension   . Pneumonia     hx  . Diabetes mellitus   . Peripheral vascular disease     hx rt carotid s/p  . GERD (gastroesophageal reflux disease)   . Arthritis   . Hyperlipidemia   . Carotid artery occlusion   . Atrial fibrillation     ROS: [x]  Positive   [ ]  Denies    General: [ ]  Weight loss, [ ]  Fever, [ ]  chills Neurologic: [ ]  Dizziness, [ ]  Blackouts, [ ]  Seizure [ ]  Stroke, [ ]  "Mini stroke", [ ]  Slurred speech, [ ]  Temporary blindness; [ ]  weakness in arms or legs, [ ]  Hoarseness Cardiac: [ ]  Chest pain/pressure, [ ]  Shortness of breath at rest [ ]  Shortness of breath with exertion, [ ]  Atrial fibrillation or irregular heartbeat Vascular: [ ]  Pain in legs with walking, [ ]  Pain in legs at rest, [ ]  Pain in legs at night,  [ ]  Non-healing ulcer, [ ]  Blood clot in vein/DVT,   Pulmonary: [ ]  Home oxygen, [ ]  Productive cough, [ ]  Coughing up blood, [ ]  Asthma,  [ ]  Wheezing Musculoskeletal:  [ ]  Arthritis, [ ]  Low back pain, [ ]  Joint pain Hematologic: [ ]  Easy Bruising, [ ]  Anemia; [ ]  Hepatitis Gastrointestinal: [ ]  Blood in stool, [ ]  Gastroesophageal Reflux/heartburn, [ ]  Trouble swallowing Urinary: [ ]  chronic Kidney disease, [ ]  on HD - [ ]  MWF or [ ]  TTHS, [ ]  Burning with urination, [ ]  Difficulty  urinating Skin: [ ]  Rashes, [ ]  Wounds Psychological: [ ]  Anxiety, [ ]  Depression   Social History History  Substance Use Topics  . Smoking status: Former Smoker -- 0.2 packs/day for 32 years    Types: Cigarettes    Quit date: 02/20/2004  . Smokeless tobacco: Never Used  . Alcohol Use: No    Family History Family History  Problem Relation Age of Onset  . Heart disease Mother     Heart Disease before age 39  . Heart disease Sister     Heart Disease before age 52  . Heart disease Father     Allergies  Allergen Reactions  . Hibiclens (Chlorhexidine Gluconate)   . Iohexol      Desc: ANGIO EDEMA W/ ANAPHYLAXIS PRIOR... HIVES POST 13 HR PREP...OK/A.C.   . Levaquin (Levofloxacin In D5w)   . Oxycodone-Acetaminophen Nausea And Vomiting  . Propoxyphene-Acetaminophen Nausea And Vomiting    Current Outpatient Prescriptions  Medication Sig Dispense Refill  . aspirin 325 MG tablet Take 325 mg by mouth daily. HOLD DUE TO SURGERY      . atorvastatin (LIPITOR) 80 MG tablet Take 80 mg by mouth daily.      . diazepam (VALIUM) 5 MG tablet Take 1 tablet (5 mg total) by mouth every 6 (six)  hours as needed.  30 tablet  0  . fenofibrate 160 MG tablet Take 160 mg by mouth daily.      . hydrochlorothiazide (HYDRODIURIL) 25 MG tablet Take 25 mg by mouth daily.      Marland Kitchen HYDROcodone-acetaminophen (NORCO/VICODIN) 5-325 MG per tablet Take 1-2 tablets by mouth every 4 (four) hours as needed for pain.  60 tablet  0  . insulin glargine (LANTUS) 100 UNIT/ML injection Inject 30 Units into the skin at bedtime.      . insulin lispro (HUMALOG) 100 UNIT/ML injection Inject 5-20 Units into the skin 3 (three) times daily before meals. Sliding scale      . losartan (COZAAR) 50 MG tablet Take 50 mg by mouth daily.      . meloxicam (MOBIC) 15 MG tablet Take 15 mg by mouth daily. ON HOLD DUE TO SURGERY      . metFORMIN (GLUCOPHAGE) 500 MG tablet Take 500 mg by mouth 3 (three) times daily.      . metoprolol  (LOPRESSOR) 50 MG tablet Take 25 mg by mouth 2 (two) times daily.      . Multiple Vitamin (MULTIVITAMIN) tablet Take 1 tablet by mouth daily.      . Omega-3 Fatty Acids (FISH OIL) 300 MG CAPS Take 300 mg by mouth daily.      Marland Kitchen omeprazole (PRILOSEC) 20 MG capsule Take 20 mg by mouth daily.      . traMADol (ULTRAM) 50 MG tablet Take 50 mg by mouth every 4 (four) hours as needed. For pain         Physical Examination  Filed Vitals:   06/24/12 1127  BP: 146/55  Pulse: 59  Resp:     Body mass index is 34.45 kg/(m^2).  General:  WDWN in NAD Gait: Normal HEENT: WNL Eyes: Pupils equal Pulmonary: normal non-labored breathing , without Rales, rhonchi,  wheezing Cardiac: RRR, without  Murmurs, rubs or gallops; Abdomen: soft, NT, no masses Skin: no rashes, ulcers noted  Vascular Exam Pulses: 3+ radial pulses bilaterally Carotid bruits: Carotid pulses to auscultation no bruits are heard Extremities without ischemic changes, no Gangrene , no cellulitis; no open wounds;  Musculoskeletal: no muscle wasting or atrophy   Neurologic: A&O X 3; Appropriate Affect ; SENSATION: normal; MOTOR FUNCTION:  moving all extremities equally. Speech is fluent/normal  Non-Invasive Vascular Imaging CAROTID DUPLEX 06/24/2012  Right ICA 20 - 39 % stenosis Left ICA 40 - 59 % stenosis   ASSESSMENT/PLAN: Asymptomatic patient with stable carotid duplex from January of 2012. The patient will followup in one year with repeat carotid duplex. The patient's questions were encouraged and answered, she is in agreement with this plan.  Lauree Chandler ANP   Clinic MD: Darrick Penna

## 2012-06-25 NOTE — Addendum Note (Signed)
Addended by: Sharee Pimple on: 06/25/2012 01:28 PM   Modules accepted: Orders

## 2012-12-28 ENCOUNTER — Other Ambulatory Visit: Payer: Self-pay

## 2012-12-28 DIAGNOSIS — Z1231 Encounter for screening mammogram for malignant neoplasm of breast: Secondary | ICD-10-CM

## 2013-01-20 ENCOUNTER — Ambulatory Visit
Admission: RE | Admit: 2013-01-20 | Discharge: 2013-01-20 | Disposition: A | Discharge status: Home / Self Care | Payer: Medicare Other | Source: Ambulatory Visit

## 2013-01-20 DIAGNOSIS — Z1231 Encounter for screening mammogram for malignant neoplasm of breast: Secondary | ICD-10-CM

## 2013-06-30 ENCOUNTER — Encounter: Payer: Self-pay | Admitting: Family

## 2013-07-01 ENCOUNTER — Inpatient Hospital Stay (HOSPITAL_COMMUNITY): Admission: RE | Admit: 2013-07-01 | Payer: Medicare Other | Source: Ambulatory Visit

## 2013-07-01 ENCOUNTER — Ambulatory Visit: Payer: Medicare Other | Admitting: Family

## 2013-07-04 ENCOUNTER — Ambulatory Visit: Payer: Medicare Other | Admitting: Family

## 2013-07-04 ENCOUNTER — Other Ambulatory Visit (HOSPITAL_COMMUNITY): Payer: Medicare Other

## 2013-12-30 ENCOUNTER — Other Ambulatory Visit: Payer: Self-pay

## 2013-12-30 DIAGNOSIS — Z1231 Encounter for screening mammogram for malignant neoplasm of breast: Secondary | ICD-10-CM

## 2014-01-02 ENCOUNTER — Other Ambulatory Visit: Payer: Self-pay | Admitting: Neurological Surgery

## 2014-01-02 DIAGNOSIS — M4716 Other spondylosis with myelopathy, lumbar region: Secondary | ICD-10-CM

## 2014-01-10 ENCOUNTER — Ambulatory Visit
Admission: RE | Admit: 2014-01-10 | Discharge: 2014-01-10 | Disposition: A | Discharge status: Home / Self Care | Payer: Medicare Other | Source: Ambulatory Visit | Attending: Neurological Surgery | Admitting: Neurological Surgery

## 2014-01-10 DIAGNOSIS — M4716 Other spondylosis with myelopathy, lumbar region: Secondary | ICD-10-CM

## 2014-01-24 ENCOUNTER — Ambulatory Visit: Payer: Medicare Other

## 2014-01-24 ENCOUNTER — Ambulatory Visit
Admission: RE | Admit: 2014-01-24 | Discharge: 2014-01-24 | Disposition: A | Discharge status: Home / Self Care | Payer: Medicare Other | Source: Ambulatory Visit

## 2014-01-24 DIAGNOSIS — Z1231 Encounter for screening mammogram for malignant neoplasm of breast: Secondary | ICD-10-CM

## 2014-01-27 ENCOUNTER — Ambulatory Visit: Payer: Medicare Other

## 2014-06-28 ENCOUNTER — Other Ambulatory Visit: Payer: Self-pay | Admitting: *Deleted

## 2014-06-28 DIAGNOSIS — Z48812 Encounter for surgical aftercare following surgery on the circulatory system: Secondary | ICD-10-CM

## 2014-06-28 DIAGNOSIS — I6523 Occlusion and stenosis of bilateral carotid arteries: Secondary | ICD-10-CM

## 2014-06-30 ENCOUNTER — Encounter: Payer: Self-pay | Admitting: Family

## 2014-07-03 ENCOUNTER — Ambulatory Visit: Payer: Self-pay | Admitting: Family

## 2014-07-03 ENCOUNTER — Other Ambulatory Visit (HOSPITAL_COMMUNITY): Payer: Self-pay

## 2014-07-07 ENCOUNTER — Encounter: Payer: Self-pay | Admitting: Family

## 2014-07-10 ENCOUNTER — Ambulatory Visit (HOSPITAL_COMMUNITY)
Admission: RE | Admit: 2014-07-10 | Discharge: 2014-07-10 | Disposition: A | Discharge status: Home / Self Care | Payer: Medicare Other | Source: Ambulatory Visit | Attending: Surgery | Admitting: Surgery

## 2014-07-10 ENCOUNTER — Other Ambulatory Visit: Payer: Self-pay | Admitting: Surgery

## 2014-07-10 ENCOUNTER — Encounter: Payer: Self-pay | Admitting: Family

## 2014-07-10 ENCOUNTER — Ambulatory Visit (INDEPENDENT_AMBULATORY_CARE_PROVIDER_SITE_OTHER): Payer: Medicare Other | Admitting: Family

## 2014-07-10 VITALS — BP 142/65 | HR 62 | Resp 16 | Ht 65.0 in | Wt 198.0 lb

## 2014-07-10 DIAGNOSIS — Z9889 Other specified postprocedural states: Secondary | ICD-10-CM

## 2014-07-10 DIAGNOSIS — I6523 Occlusion and stenosis of bilateral carotid arteries: Secondary | ICD-10-CM | POA: Diagnosis present

## 2014-07-10 DIAGNOSIS — Z87891 Personal history of nicotine dependence: Secondary | ICD-10-CM

## 2014-07-10 DIAGNOSIS — Z48812 Encounter for surgical aftercare following surgery on the circulatory system: Secondary | ICD-10-CM

## 2014-07-10 DIAGNOSIS — E119 Type 2 diabetes mellitus without complications: Secondary | ICD-10-CM

## 2014-07-10 DIAGNOSIS — Z794 Long term (current) use of insulin: Secondary | ICD-10-CM

## 2014-07-10 NOTE — Progress Notes (Signed)
Established Carotid Patient   History of Present Illness  Gloria [name] is a 74 y.o. female patient of Dr. Trula Slade who is status post right CEA in 2007 with Dr. Amedeo Plenty.  She returns today for follow up. She was last evaluated in our practice in February 2014.  The patient states she did have a lumbar decompression with Dr. Ellene Route and October 2013 has some residual left leg swelling that has been interrogated with Doppler and they found no vascular problem or DVT and she continues to work with Dr. Ellene Route regarding this problem. She reports that she is receiving ESI's and needs back surgery, but her daughter has stage 4 breast cancer and pt does not want to have back surgery. 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  The patient denies any history of TIA or stroke symptoms, specifically the patient denies a history of amaurosis fugax or monocular blindness, denies a history unilateral  of facial drooping, denies a history of hemiplegia, and denies a history of receptive or expressive aphasia.    The patient denies New Medical or Surgical History.  Pt Diabetic: Yes, states she has had to take lots of oral steroid and ESI's for back issues and her sugar increased, states her last A1C was 6.3 Pt smoker: former smoker, quit in 2005  Pt meds include: Statin : Yes ASA: Yes Other anticoagulants/antiplatelets: no   Past Medical History  Diagnosis Date  . PONV (postoperative nausea and vomiting)   . Hypertension   . Pneumonia     hx  . Diabetes mellitus   . Peripheral vascular disease     hx rt carotid s/p  . GERD (gastroesophageal reflux disease)   . Arthritis   . Hyperlipidemia   . Carotid artery occlusion   . Atrial fibrillation     Social History History  Substance Use Topics  . Smoking status: Former Smoker -- 0.25 packs/day for 32 years    Types: Cigarettes    Quit date: 02/20/2004  . Smokeless tobacco:  Never Used  . Alcohol Use: No    Family History Family History  Problem Relation Age of Onset  . Heart disease Mother     Heart Disease before age 50  . Heart disease Sister     Heart Disease before age 55  . Heart disease Father     Surgical History Past Surgical History  Procedure Laterality Date  . Cholecystectomy  68  . Lumbar laminectomy/decompression microdiscectomy  02/27/2012    Procedure: LUMBAR LAMINECTOMY/DECOMPRESSION MICRODISCECTOMY 2 LEVELS;  Surgeon: Kristeen Miss, MD;  Location: [facility];  Service: Neurosurgery;  Laterality: N/A;  Lumbar two-three, four-five Laminectomy/foraminotomy  . Carotid endarterectomy  68    rt  . Carotid endarterectomy  Jan. 19, 2007    Right  cea  . Spinal cord decompression  Oct. 2013    Allergies  Allergen Reactions  . Hibiclens [Chlorhexidine Gluconate]   . Iohexol      Desc: ANGIO EDEMA W/ ANAPHYLAXIS PRIOR... HIVES POST 13 HR PREP...OK/A.C.   . Levaquin [Levofloxacin In D5w]   . Oxycodone-Acetaminophen Nausea And Vomiting  . Propoxyphene N-Acetaminophen Nausea And Vomiting    Current Outpatient Prescriptions  Medication Sig Dispense Refill  . aspirin 325 MG tablet Take 325 mg by mouth daily. HOLD DUE TO SURGERY    . atorvastatin (LIPITOR) 80 MG tablet Take 80 mg by mouth daily.    . fenofibrate 160 MG tablet Take 160  mg by mouth daily.    . hydrochlorothiazide (HYDRODIURIL) 25 MG tablet Take 25 mg by mouth daily.    Marland Kitchen HYDROcodone-acetaminophen (NORCO/VICODIN) 5-325 MG per tablet Take 1-2 tablets by mouth every 4 (four) hours as needed for pain. 60 tablet 0  . insulin glargine (LANTUS) 100 UNIT/ML injection Inject 30 Units into the skin at bedtime.    . insulin lispro (HUMALOG) 100 UNIT/ML injection Inject 5-20 Units into the skin 3 (three) times daily before meals. Sliding scale    . losartan (COZAAR) 50 MG tablet Take 50 mg by mouth daily.    . metFORMIN (GLUCOPHAGE) 500 MG tablet Take 500 mg by mouth 3 (three) times  daily.    . metoprolol (LOPRESSOR) 50 MG tablet Take 25 mg by mouth 2 (two) times daily.    . Multiple Vitamin (MULTIVITAMIN) tablet Take 1 tablet by mouth daily.    . Omega-3 Fatty Acids (FISH OIL) 300 MG CAPS Take 300 mg by mouth daily.    Marland Kitchen omeprazole (PRILOSEC) 20 MG capsule Take 20 mg by mouth daily.    . traMADol (ULTRAM) 50 MG tablet Take 50 mg by mouth daily. For pain- BACK  ( Extended Release )    . diazepam (VALIUM) 5 MG tablet Take 1 tablet (5 mg total) by mouth every 6 (six) hours as needed. (Patient not taking: Reported on 07/10/2014) 30 tablet 0  . meloxicam (MOBIC) 15 MG tablet Take 15 mg by mouth daily. ON HOLD DUE TO SURGERY     No current facility-administered medications for this visit.    Review of Systems : See HPI for pertinent positives and negatives.  Physical Examination  Filed Vitals:   07/10/14 1157 07/10/14 1203  BP: 117/57 142/65  Pulse: 62 62  Resp:  16  Height:  5\' 5"  (1.651 m)  Weight:  198 lb (89.812 kg)  SpO2:  99%   Body mass index is 32.95 kg/(m^2).  General: WDWN obese female in NAD GAIT: normal Eyes: PERRLA Pulmonary:  Non-labored, CTAB, Negative  Rales, Negative rhonchi, & Negative wheezing.  Cardiac: regular Rhythm, no detected murmur.  VASCULAR EXAM Carotid Bruits Right Left   Negative Negative    Aorta is not palpable. Radial pulses are 2+ palpable and equal.                                                                                                                            LE Pulses Right Left       POPLITEAL  not palpable   not palpable       POSTERIOR TIBIAL  not palpable   not palpable        DORSALIS PEDIS      ANTERIOR TIBIAL faintly palpable  faintly palpable     Gastrointestinal: soft, nontender, BS WNL, no r/g,  negative palpated masses.  Musculoskeletal: Negative muscle atrophy/wasting. M/S 5/5 throughout, Extremities without ischemic changes.  Neurologic: A&O X 3; Appropriate Affect, Speech is  normal CN 2-12 intact, Pain and light touch intact in extremities, Motor exam as listed above.   Non-Invasive Vascular Imaging CAROTID DUPLEX 07/10/2014  CEREBROVASCULAR DUPLEX EVALUATION    INDICATION: Carotid stenosis    PREVIOUS INTERVENTION(S): Right carotid endarterectomy 06/06/2005.    DUPLEX EXAM:     RIGHT  LEFT  Peak Systolic Velocities (cm/s) End Diastolic Velocities (cm/s) Plaque LOCATION Peak Systolic Velocities (cm/s) End Diastolic Velocities (cm/s) Plaque  98 13 HT CCA PROXIMAL 58 7 HT  130 16 HT CCA MID 65 14 HT  116 11 HT CCA DISTAL 107 21 HT  269 8 HT ECA 434 0 CP  96 17  ICA PROXIMAL 204 42 CP  181 0 HT ICA MID 238 54 HT  124 25  ICA DISTAL 117 28     carotid endarterectomy ICA / CCA Ratio (PSV) 3.24  Antegrade Vertebral Flow Antegrade  564 Brachial Systolic Pressure (mmHg) 332  Triphasic Brachial Artery Waveforms Triphasic    Plaque Morphology:  HM = Homogeneous, HT = Heterogeneous, CP = Calcific Plaque, SP = Smooth Plaque, IP = Irregular Plaque  ADDITIONAL FINDINGS:     IMPRESSION: Right internal carotid artery is patent with history of carotid endarterectomy, mild hyperplasia present at the proximal to mid endarterectomized segment suggestive of less than 40% stenosis. Bilateral external carotid artery stenosis. Left internal carotid artery stenosis present in the 40%-59% range.    Compared to the previous exam:  Essentially unchanged since study on 06/24/2012.      Assessment: LOTA LEAMER is a 74 y.o. female who is status post right CEA in 2007. She has no history of stroke or TIA. Today's carotid Duplex reveals a patent right internal carotid artery with history of carotid endarterectomy, mild hyperplasia present at the proximal to mid endarterectomized segment suggestive of less than 40% stenosis, left internal carotid artery stenosis present in the 40%-59% range; essentially unchanged since study on 06/24/2012. Her atherosclerotic risk  factors include former smoker, controlled DM, and obesity.  Face to face time with patient was 25 minutes. Over 50% of this time was spent on counseling and coordination of care.   Plan: Follow-up in 1 year with Carotid Duplex   I discussed in depth with the patient the nature of atherosclerosis, and emphasized the importance of maximal medical management including strict control of blood pressure, blood glucose, and lipid levels, obtaining regular exercise, and continued cessation of smoking.  The patient is aware that without maximal medical management the underlying atherosclerotic disease process will progress, limiting the benefit of any interventions. The patient was given information about stroke prevention and what symptoms should prompt the patient to seek immediate medical care. Thank you for allowing [place] to participate in this patient's care.  Clemon Chambers, RN, MSN, FNP-C [facility] Office: [phone]  Clinic Physician: Trula Slade  07/10/2014 12:04 PM

## 2014-07-10 NOTE — Patient Instructions (Signed)
Stroke Prevention Some medical conditions and behaviors are associated with an increased chance of having a stroke. You may prevent a stroke by making healthy choices and managing medical conditions. HOW CAN I REDUCE MY RISK OF HAVING A STROKE?   Stay physically active. Get at least 30 minutes of activity on most or all days.  Do not smoke. It may also be helpful to avoid exposure to secondhand smoke.  Limit alcohol use. Moderate alcohol use is considered to be:  No more than 2 drinks per day for men.  No more than 1 drink per day for nonpregnant women.  Eat healthy foods. This involves:  Eating 5 or more servings of fruits and vegetables a day.  Making dietary changes that address high blood pressure (hypertension), high cholesterol, diabetes, or obesity.  Manage your cholesterol levels.  Making food choices that are high in fiber and low in saturated fat, trans fat, and cholesterol may control cholesterol levels.  Take any prescribed medicines to control cholesterol as directed by your health care provider.  Manage your diabetes.  Controlling your carbohydrate and sugar intake is recommended to manage diabetes.  Take any prescribed medicines to control diabetes as directed by your health care provider.  Control your hypertension.  Making food choices that are low in salt (sodium), saturated fat, trans fat, and cholesterol is recommended to manage hypertension.  Take any prescribed medicines to control hypertension as directed by your health care provider.  Maintain a healthy weight.  Reducing calorie intake and making food choices that are low in sodium, saturated fat, trans fat, and cholesterol are recommended to manage weight.  Stop drug abuse.  Avoid taking birth control pills.  Talk to your health care provider about the risks of taking birth control pills if you are over 35 years old, smoke, get migraines, or have ever had a blood clot.  Get evaluated for sleep  disorders (sleep apnea).  Talk to your health care provider about getting a sleep evaluation if you snore a lot or have excessive sleepiness.  Take medicines only as directed by your health care provider.  For some people, aspirin or blood thinners (anticoagulants) are helpful in reducing the risk of forming abnormal blood clots that can lead to stroke. If you have the irregular heart rhythm of atrial fibrillation, you should be on a blood thinner unless there is a good reason you cannot take them.  Understand all your medicine instructions.  Make sure that other conditions (such as anemia or atherosclerosis) are addressed. SEEK IMMEDIATE MEDICAL CARE IF:   You have sudden weakness or numbness of the face, arm, or leg, especially on one side of the body.  Your face or eyelid droops to one side.  You have sudden confusion.  You have trouble speaking (aphasia) or understanding.  You have sudden trouble seeing in one or both eyes.  You have sudden trouble walking.  You have dizziness.  You have a loss of balance or coordination.  You have a sudden, severe headache with no known cause.  You have new chest pain or an irregular heartbeat. Any of these symptoms may represent a serious problem that is an emergency. Do not wait to see if the symptoms will go away. Get medical help at once. Call your local emergency services (911 in [place] Do not drive yourself to the hospital. Document Released: 06/12/2004 Document Revised: 09/19/2013 Document Reviewed: 11/05/2012 [employer] Patient Information 2015 [employer]. This information is not intended to replace advice given   to you by your health care provider. Make sure you discuss any questions you have with your health care provider.  

## 2015-01-01 ENCOUNTER — Other Ambulatory Visit (HOSPITAL_BASED_OUTPATIENT_CLINIC_OR_DEPARTMENT_OTHER): Payer: Self-pay | Admitting: Internal Medicine

## 2015-01-01 DIAGNOSIS — Z1231 Encounter for screening mammogram for malignant neoplasm of breast: Secondary | ICD-10-CM

## 2015-01-23 ENCOUNTER — Other Ambulatory Visit: Payer: Self-pay | Admitting: Internal Medicine

## 2015-01-23 DIAGNOSIS — Z1231 Encounter for screening mammogram for malignant neoplasm of breast: Secondary | ICD-10-CM

## 2015-01-26 ENCOUNTER — Ambulatory Visit: Payer: Medicare Other

## 2015-01-26 ENCOUNTER — Ambulatory Visit (HOSPITAL_BASED_OUTPATIENT_CLINIC_OR_DEPARTMENT_OTHER): Payer: Medicare Other

## 2015-01-26 ENCOUNTER — Ambulatory Visit
Admission: RE | Admit: 2015-01-26 | Discharge: 2015-01-26 | Disposition: A | Discharge status: Home / Self Care | Payer: Medicare Other | Source: Ambulatory Visit | Attending: Internal Medicine | Admitting: Internal Medicine

## 2015-01-26 DIAGNOSIS — Z1231 Encounter for screening mammogram for malignant neoplasm of breast: Secondary | ICD-10-CM

## 2015-03-28 ENCOUNTER — Telehealth: Payer: Self-pay

## 2015-03-28 NOTE — Telephone Encounter (Signed)
Phone call from pt.  Reported she has had swelling and pain in the left leg, since back surgery, approx. 2 yrs. ago.  Stated that the "left leg swells daily, and goes down every night."  Reported she gets pain from the sciatic nerve to the left calf/ ankle; described the pain "like a hot poker."  Denied any heaviness of the left leg.  Reported intermittent numbness @ (L) lateral knee and ankle, associated with the swelling.  Also reported Dr. Ellene Route diagnosed her with "Sympathetic Dystrophy."   Pt. questioned if her swelling was related to "something vascular"?  Advised that since the pain and swelling have been present since her back surgery, and with the intense pain she is describing, to f/u with Dr. Ellene Route, initially.  If Dr. Ellene Route wants to have her further evaluated by Vascular surgery, then he can make a referral.  Pt. Verb. Understanding and agreed to f/u with Dr. Ellene Route.

## 2015-05-20 HISTORY — PX: EYE SURGERY: SHX253

## 2015-07-16 ENCOUNTER — Other Ambulatory Visit (HOSPITAL_COMMUNITY): Payer: Medicare Other

## 2015-07-16 ENCOUNTER — Ambulatory Visit: Payer: Medicare Other | Admitting: Family

## 2015-07-30 ENCOUNTER — Encounter: Payer: Self-pay | Admitting: Family

## 2015-08-03 ENCOUNTER — Encounter: Payer: Self-pay | Admitting: Family

## 2015-08-03 ENCOUNTER — Ambulatory Visit (HOSPITAL_COMMUNITY)
Admission: RE | Admit: 2015-08-03 | Discharge: 2015-08-03 | Disposition: A | Discharge status: Home / Self Care | Payer: Medicare Other | Source: Ambulatory Visit | Attending: Family | Admitting: Family

## 2015-08-03 ENCOUNTER — Ambulatory Visit (INDEPENDENT_AMBULATORY_CARE_PROVIDER_SITE_OTHER): Payer: Medicare Other | Admitting: Family

## 2015-08-03 VITALS — BP 148/64 | HR 62 | Temp 97.1°F | Resp 20 | Ht 65.0 in | Wt 197.0 lb

## 2015-08-03 DIAGNOSIS — Z794 Long term (current) use of insulin: Secondary | ICD-10-CM | POA: Diagnosis not present

## 2015-08-03 DIAGNOSIS — Z87891 Personal history of nicotine dependence: Secondary | ICD-10-CM

## 2015-08-03 DIAGNOSIS — E785 Hyperlipidemia, unspecified: Secondary | ICD-10-CM | POA: Diagnosis not present

## 2015-08-03 DIAGNOSIS — I6523 Occlusion and stenosis of bilateral carotid arteries: Secondary | ICD-10-CM | POA: Diagnosis not present

## 2015-08-03 DIAGNOSIS — I10 Essential (primary) hypertension: Secondary | ICD-10-CM | POA: Diagnosis not present

## 2015-08-03 DIAGNOSIS — E119 Type 2 diabetes mellitus without complications: Secondary | ICD-10-CM

## 2015-08-03 DIAGNOSIS — K219 Gastro-esophageal reflux disease without esophagitis: Secondary | ICD-10-CM | POA: Diagnosis not present

## 2015-08-03 DIAGNOSIS — Z48812 Encounter for surgical aftercare following surgery on the circulatory system: Secondary | ICD-10-CM

## 2015-08-03 NOTE — Progress Notes (Signed)
Chief Complaint: Extracranial Carotid Artery Stenosis   History of Present Illness  Gloria [name] is a 75 y.o. female patient of Dr. Trula Slade who is status post right CEA in 2007 by Dr. Amedeo Plenty.  She returns today for follow up.  The patient denies any history of TIA or stroke symptoms, specifically the patient denies a history of amaurosis fugax or monocular blindness, denies a history unilateral of facial drooping, denies a history of hemiplegia, and denies a history of receptive or expressive aphasia.    The patient states she had a lumbar decompression by Dr. Ellene Route in October 2013, has some residual left leg swelling that has been interrogated with Doppler and they found no vascular problem or DVT. 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  She reports that she is receiving ESI's and needs back surgery, but her daughter has stage 4 breast cancer and pt does not want to have back surgery. She is taking care of her daughter.  Pt states Dr. Zadie Rhine is her ophthalmologist.   Pt Diabetic: Yes, states she has had to take lots of oral steroid and ESI's for back issues and her sugar increased, recently had a corticosteroid injection in her knee , states her last A1C was 6.5 in Sept., 2016 Pt smoker: former smoker, quit in 2005  Pt meds include: Statin : Yes ASA: Yes Other anticoagulants/antiplatelets: no    Past Medical History  Diagnosis Date  . PONV (postoperative nausea and vomiting)   . Hypertension   . Pneumonia     hx  . Diabetes mellitus   . Peripheral vascular disease     hx rt carotid s/p  . GERD (gastroesophageal reflux disease)   . Arthritis   . Hyperlipidemia   . Carotid artery occlusion   . Atrial fibrillation     Social History Social History  Substance Use Topics  . Smoking status: Former Smoker -- 0.25 packs/day for 32 years    Types: Cigarettes    Quit date: 02/20/2004  .  Smokeless tobacco: Never Used  . Alcohol Use: No    Family History Family History  Problem Relation Age of Onset  . Heart disease Mother     Heart Disease before age 16  . Heart disease Sister     Heart Disease before age 76  . Heart disease Father     Surgical History Past Surgical History  Procedure Laterality Date  . Cholecystectomy  68  . Lumbar laminectomy/decompression microdiscectomy  02/27/2012    Procedure: LUMBAR LAMINECTOMY/DECOMPRESSION MICRODISCECTOMY 2 LEVELS;  Surgeon: Kristeen Miss, MD;  Location: [facility];  Service: Neurosurgery;  Laterality: N/A;  Lumbar two-three, four-five Laminectomy/foraminotomy  . Carotid endarterectomy  68    rt  . Carotid endarterectomy  Jan. 19, 2007    Right  cea  . Spinal cord decompression  Oct. 2013    Allergies  Allergen Reactions  . Hibiclens [Chlorhexidine Gluconate]   . Iohexol      Desc: ANGIO EDEMA W/ ANAPHYLAXIS PRIOR... HIVES POST 13 HR PREP...OK/A.C.   . Levaquin [Levofloxacin In D5w]   . Oxycodone-Acetaminophen Nausea And Vomiting  . Propoxyphene N-Acetaminophen Nausea And Vomiting    Current Outpatient Prescriptions  Medication Sig Dispense Refill  . aspirin 325 MG tablet Take 325 mg by mouth daily. HOLD DUE TO SURGERY    . atorvastatin (LIPITOR) 80 MG tablet Take 80 mg by mouth daily.    . diazepam (  VALIUM) 5 MG tablet Take 1 tablet (5 mg total) by mouth every 6 (six) hours as needed. (Patient not taking: Reported on 07/10/2014) 30 tablet 0  . fenofibrate 160 MG tablet Take 160 mg by mouth daily.    . hydrochlorothiazide (HYDRODIURIL) 25 MG tablet Take 25 mg by mouth daily.    Marland Kitchen HYDROcodone-acetaminophen (NORCO/VICODIN) 5-325 MG per tablet Take 1-2 tablets by mouth every 4 (four) hours as needed for pain. 60 tablet 0  . insulin glargine (LANTUS) 100 UNIT/ML injection Inject 30 Units into the skin at bedtime.    . insulin lispro (HUMALOG) 100 UNIT/ML injection Inject 5-20 Units into the skin 3 (three) times  daily before meals. Sliding scale    . losartan (COZAAR) 50 MG tablet Take 50 mg by mouth daily.    . meloxicam (MOBIC) 15 MG tablet Take 15 mg by mouth daily. ON HOLD DUE TO SURGERY    . metFORMIN (GLUCOPHAGE) 500 MG tablet Take 500 mg by mouth 3 (three) times daily.    . metoprolol (LOPRESSOR) 50 MG tablet Take 25 mg by mouth 2 (two) times daily.    . Multiple Vitamin (MULTIVITAMIN) tablet Take 1 tablet by mouth daily.    . Omega-3 Fatty Acids (FISH OIL) 300 MG CAPS Take 300 mg by mouth daily.    Marland Kitchen omeprazole (PRILOSEC) 20 MG capsule Take 20 mg by mouth daily.    . traMADol (ULTRAM) 50 MG tablet Take 50 mg by mouth daily. For pain- BACK  ( Extended Release )     No current facility-administered medications for this visit.    Review of Systems : See HPI for pertinent positives and negatives.  Physical Examination  Filed Vitals:   08/03/15 1108 08/03/15 1110 08/03/15 1112  BP: 150/78 147/65 148/64  Pulse: 66 60 62  Temp: 97.1 F (36.2 C)    TempSrc: Oral    Resp: 20    Height: 5\' 5"  (1.651 m)    Weight: 197 lb (89.359 kg)    SpO2: 96%     Body mass index is 32.78 kg/(m^2).   General: WDWN obese female in NAD GAIT: normal Eyes: PERRLA Pulmonary: Non-labored respirations, CTAB, no rales,  rhonchi, or wheezing.  Cardiac: regular rhythm, no detected murmur.  VASCULAR EXAM Carotid Bruits Right Left   Negative Negative   Aorta is not palpable. Radial pulses are 2+ palpable and equal.      LE Pulses Right Left   POPLITEAL not palpable  not palpable   POSTERIOR TIBIAL not palpable  palpable    DORSALIS PEDIS  ANTERIOR TIBIAL faintly palpable  faintly palpable     Gastrointestinal: soft, nontender, BS WNL, no r/g, no palpated masses.  Musculoskeletal: Negative muscle atrophy/wasting. M/S 5/5  throughout, Extremities without ischemic changes.  Neurologic: A&O X 3; Appropriate Affect, Speech is normal CN 2-12 intact, Pain and light touch intact in extremities, Motor exam as listed above.           Non-Invasive Vascular Imaging CAROTID DUPLEX 08/03/2015   CEREBROVASCULAR DUPLEX EVALUATION    INDICATION: Carotid stenosis    PREVIOUS INTERVENTION(S): Right carotid endarterectomy 06/06/2005.    DUPLEX EXAM:     RIGHT  LEFT  Peak Systolic Velocities (cm/s) End Diastolic Velocities (cm/s) Plaque LOCATION Peak Systolic Velocities (cm/s) End Diastolic Velocities (cm/s) Plaque  68 5 HT CCA PROXIMAL 54 10 HT  73 12 HT CCA MID 62 13 HT  81 9 HT CCA DISTAL 83 18 HT  87 5 HT  ECA 254 33 CP  80 12  ICA PROXIMAL 233 48 CP  74 19 HT ICA MID 99 26   70 16  ICA DISTAL 193 31 HT    carotid endarterectomy ICA / CCA Ratio (PSV) 2.8  Antegrade Vertebral Flow Antegrade   Brachial Systolic Pressure (mmHg)   Triphasic Brachial Artery Waveforms Triphasic    Plaque Morphology:  HM = Homogeneous, HT = Heterogeneous, CP = Calcific Plaque, SP = Smooth Plaque, IP = Irregular Plaque  ADDITIONAL FINDINGS:     IMPRESSION: Right internal carotid artery is patent with history of carotid endarterectomy, mild hyperplasia present at the proximal to mid endarterectomized segment suggestive of 1-39% stenosis. Bilateral external carotid artery stenosis. Left internal carotid artery stenosis with EDV consistent with a 40%-59% diameter reduction, upper end of range.    Compared to the previous exam:  Essentially unchanged since study on 07/11/2015.      Assessment: Gloria [name] is a 75 y.o. female who is status post right CEA in 2007.  She has no history of stroke or TIA.  Today's carotid duplex suggests mild hyperplasia present at the right ICA proximal to mid endarterectomized segment suggestive of 1-39% stenosis. Bilateral external carotid artery stenosis. Left internal carotid artery  stenosis with EDV consistent with a 40%-59% stenosis. Essentially unchanged since study on 07/11/2015.   Plan: Follow-up in 1 year with Carotid Duplex scan.   I discussed in depth with the patient the nature of atherosclerosis, and emphasized the importance of maximal medical management including strict control of blood pressure, blood glucose, and lipid levels, obtaining regular exercise, and continued cessation of smoking.  The patient is aware that without maximal medical management the underlying atherosclerotic disease process will progress, limiting the benefit of any interventions. The patient was given information about stroke prevention and what symptoms should prompt the patient to seek immediate medical care. Thank you for allowing [place] to participate in this patient's care.  Clemon Chambers, RN, MSN, FNP-C [facility] Office: [phone]  Clinic Physician: Bridgett Larsson  08/03/2015 9:32 AM

## 2015-08-03 NOTE — Patient Instructions (Signed)
Stroke Prevention Some medical conditions and behaviors are associated with an increased chance of having a stroke. You may prevent a stroke by making healthy choices and managing medical conditions. HOW CAN I REDUCE MY RISK OF HAVING A STROKE?   Stay physically active. Get at least 30 minutes of activity on most or all days.  Do not smoke. It may also be helpful to avoid exposure to secondhand smoke.  Limit alcohol use. Moderate alcohol use is considered to be:  No more than 2 drinks per day for men.  No more than 1 drink per day for nonpregnant women.  Eat healthy foods. This involves:  Eating 5 or more servings of fruits and vegetables a day.  Making dietary changes that address high blood pressure (hypertension), high cholesterol, diabetes, or obesity.  Manage your cholesterol levels.  Making food choices that are high in fiber and low in saturated fat, trans fat, and cholesterol may control cholesterol levels.  Take any prescribed medicines to control cholesterol as directed by your health care provider.  Manage your diabetes.  Controlling your carbohydrate and sugar intake is recommended to manage diabetes.  Take any prescribed medicines to control diabetes as directed by your health care provider.  Control your hypertension.  Making food choices that are low in salt (sodium), saturated fat, trans fat, and cholesterol is recommended to manage hypertension.  Ask your health care provider if you need treatment to lower your blood pressure. Take any prescribed medicines to control hypertension as directed by your health care provider.  If you are 18-39 years of age, have your blood pressure checked every 3-5 years. If you are 40 years of age or older, have your blood pressure checked every year.  Maintain a healthy weight.  Reducing calorie intake and making food choices that are low in sodium, saturated fat, trans fat, and cholesterol are recommended to manage  weight.  Stop drug abuse.  Avoid taking birth control pills.  Talk to your health care provider about the risks of taking birth control pills if you are over 35 years old, smoke, get migraines, or have ever had a blood clot.  Get evaluated for sleep disorders (sleep apnea).  Talk to your health care provider about getting a sleep evaluation if you snore a lot or have excessive sleepiness.  Take medicines only as directed by your health care provider.  For some people, aspirin or blood thinners (anticoagulants) are helpful in reducing the risk of forming abnormal blood clots that can lead to stroke. If you have the irregular heart rhythm of atrial fibrillation, you should be on a blood thinner unless there is a good reason you cannot take them.  Understand all your medicine instructions.  Make sure that other conditions (such as anemia or atherosclerosis) are addressed. SEEK IMMEDIATE MEDICAL CARE IF:   You have sudden weakness or numbness of the face, arm, or leg, especially on one side of the body.  Your face or eyelid droops to one side.  You have sudden confusion.  You have trouble speaking (aphasia) or understanding.  You have sudden trouble seeing in one or both eyes.  You have sudden trouble walking.  You have dizziness.  You have a loss of balance or coordination.  You have a sudden, severe headache with no known cause.  You have new chest pain or an irregular heartbeat. Any of these symptoms may represent a serious problem that is an emergency. Do not wait to see if the symptoms will   go away. Get medical help at once. Call your local emergency services (911 in [place] Do not drive yourself to the hospital.   This information is not intended to replace advice given to you by your health care provider. Make sure you discuss any questions you have with your health care provider.   Document Released: 06/12/2004 Document Revised: 05/26/2014 Document Reviewed:  11/05/2012 Elsevier Interactive Patient Education 2016 Elsevier Inc.  

## 2015-08-07 NOTE — Addendum Note (Signed)
Addended by: Mena Goes on: 08/07/2015 12:38 PM   Modules accepted: Orders

## 2016-01-02 ENCOUNTER — Other Ambulatory Visit: Payer: Self-pay | Admitting: Internal Medicine

## 2016-01-02 DIAGNOSIS — Z1231 Encounter for screening mammogram for malignant neoplasm of breast: Secondary | ICD-10-CM

## 2016-01-28 ENCOUNTER — Ambulatory Visit
Admission: RE | Admit: 2016-01-28 | Discharge: 2016-01-28 | Disposition: A | Discharge status: Home / Self Care | Payer: Medicare Other | Source: Ambulatory Visit | Attending: Internal Medicine | Admitting: Internal Medicine

## 2016-01-28 DIAGNOSIS — Z1231 Encounter for screening mammogram for malignant neoplasm of breast: Secondary | ICD-10-CM

## 2016-05-19 HISTORY — PX: CATARACT EXTRACTION W/ INTRAOCULAR LENS  IMPLANT, BILATERAL: SHX1307

## 2016-06-30 ENCOUNTER — Other Ambulatory Visit: Payer: Self-pay | Admitting: Orthopedic Surgery

## 2016-06-30 DIAGNOSIS — M545 Low back pain: Secondary | ICD-10-CM

## 2016-07-04 ENCOUNTER — Ambulatory Visit
Admission: RE | Admit: 2016-07-04 | Discharge: 2016-07-04 | Disposition: A | Discharge status: Home / Self Care | Payer: Medicare Other | Source: Ambulatory Visit | Attending: Orthopedic Surgery | Admitting: Orthopedic Surgery

## 2016-07-04 DIAGNOSIS — M545 Low back pain: Secondary | ICD-10-CM

## 2016-07-25 ENCOUNTER — Encounter: Payer: Self-pay | Admitting: Family

## 2016-08-04 ENCOUNTER — Ambulatory Visit (HOSPITAL_COMMUNITY)
Admission: RE | Admit: 2016-08-04 | Discharge: 2016-08-04 | Disposition: A | Discharge status: Home / Self Care | Payer: Medicare Other | Source: Ambulatory Visit | Attending: Family | Admitting: Family

## 2016-08-04 ENCOUNTER — Ambulatory Visit (INDEPENDENT_AMBULATORY_CARE_PROVIDER_SITE_OTHER): Payer: Medicare Other | Admitting: Family

## 2016-08-04 ENCOUNTER — Encounter: Payer: Self-pay | Admitting: Family

## 2016-08-04 VITALS — BP 166/66 | HR 61 | Temp 98.0°F | Resp 18 | Ht 65.0 in | Wt 182.9 lb

## 2016-08-04 DIAGNOSIS — Z87891 Personal history of nicotine dependence: Secondary | ICD-10-CM | POA: Insufficient documentation

## 2016-08-04 DIAGNOSIS — I6523 Occlusion and stenosis of bilateral carotid arteries: Secondary | ICD-10-CM | POA: Insufficient documentation

## 2016-08-04 DIAGNOSIS — Z48812 Encounter for surgical aftercare following surgery on the circulatory system: Secondary | ICD-10-CM | POA: Diagnosis present

## 2016-08-04 LAB — VAS US CAROTID
LCCADDIAS: 26 cm/s
LCCAPDIAS: 6 cm/s
LCCAPSYS: 50 cm/s
LEFT ECA DIAS: 20 cm/s
LEFT VERTEBRAL DIAS: 17 cm/s
Left CCA dist sys: 185 cm/s
RCCADSYS: -110 cm/s
RIGHT CCA MID DIAS: 9 cm/s
RIGHT ECA DIAS: -1 cm/s
RIGHT VERTEBRAL DIAS: -10 cm/s
Right CCA prox dias: 7 cm/s
Right CCA prox sys: 106 cm/s

## 2016-08-04 NOTE — Progress Notes (Signed)
Chief Complaint: Follow up Extracranial Carotid Artery Stenosis   History of Present Illness  Gloria [name] is a 76 y.o. female patient of Dr. Trula Slade who is status post right CEA in 2007 by Dr. Amedeo Plenty.  She returns today for follow up.  The patient denies any history of TIA or stroke symptoms, specifically the patient denies a history of amaurosis fugax or monocular blindness, denies a history unilateral of facial drooping, denies a history of hemiplegia, and denies a history of receptive or expressive aphasia.    The patient states she had a lumbar decompression by Dr. Ellene Route in October 2013, has some residual left leg swelling that has been interrogated with Doppler and they found no vascular problem or DVT. 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  She reports that she is receiving ESI's and needs back surgery, but her daughter has stage 4 breast cancer and pt does not want to have back surgery. She is taking care of her daughter.  Pt states Dr. Zadie Rhine is her ophthalmologist.   Pt reports that Dr. Osborne Casco increased her metoprolol due to short episodes of atrial fib. Pt attributes this to stress related to her daughter's metastatic breast cancer.   Pt works part time as an Therapist, sports in a surgical center.   Pt Diabetic: Yes, states she has had to take lots of oral steroid and ESI's for back issues and her sugar increased, recently had a corticosteroid injection in her knee , states her last A1C was 5.? Pt smoker: former smoker, quit in 2005  Pt meds include: Statin : Yes ASA: Yes Other anticoagulants/antiplatelets: no   Past Medical History:  Diagnosis Date  . Arthritis   . Atrial fibrillation ([place])   . Carotid artery occlusion   . Diabetes mellitus   . GERD (gastroesophageal reflux disease)   . Hyperlipidemia   . Hypertension   . Peripheral vascular disease ([facility])    hx rt carotid s/p  .  Pneumonia    hx  . PONV (postoperative nausea and vomiting)     Social History Social History  Substance Use Topics  . Smoking status: Former Smoker    Packs/day: 0.25    Years: 32.00    Types: Cigarettes    Quit date: 02/20/2004  . Smokeless tobacco: Never Used  . Alcohol use No    Family History Family History  Problem Relation Age of Onset  . Heart disease Mother     Heart Disease before age 72  . Heart disease Father   . Heart disease Sister     Heart Disease before age 12    Surgical History Past Surgical History:  Procedure Laterality Date  . CAROTID ENDARTERECTOMY  68   rt  . CAROTID ENDARTERECTOMY  Jan. 19, 2007   Right  cea  . CHOLECYSTECTOMY  68  . LUMBAR LAMINECTOMY/DECOMPRESSION MICRODISCECTOMY  02/27/2012   Procedure: LUMBAR LAMINECTOMY/DECOMPRESSION MICRODISCECTOMY 2 LEVELS;  Surgeon: Kristeen Miss, MD;  Location: [facility];  Service: Neurosurgery;  Laterality: N/A;  Lumbar two-three, four-five Laminectomy/foraminotomy  . SPINAL CORD DECOMPRESSION  Oct. 2013    Allergies  Allergen Reactions  . Hibiclens [Chlorhexidine Gluconate]   . Iohexol      Desc: ANGIO EDEMA W/ ANAPHYLAXIS PRIOR... HIVES POST 13 HR PREP...OK/A.C.   . Levaquin [Levofloxacin In D5w]   . Oxycodone-Acetaminophen Nausea And Vomiting  . Propoxyphene N-Acetaminophen Nausea And Vomiting    Current Outpatient Prescriptions  Medication Sig Dispense Refill  . aspirin 325 MG tablet Take 325 mg by mouth daily. HOLD DUE TO SURGERY    . atorvastatin (LIPITOR) 80 MG tablet Take 80 mg by mouth daily.    . diazepam (VALIUM) 5 MG tablet Take 1 tablet (5 mg total) by mouth every 6 (six) hours as needed. 30 tablet 0  . fenofibrate 160 MG tablet Take 160 mg by mouth daily.    . hydrochlorothiazide (HYDRODIURIL) 25 MG tablet Take 25 mg by mouth daily.    . insulin glargine (LANTUS) 100 UNIT/ML injection Inject 30 Units into the skin at bedtime.    . insulin lispro (HUMALOG) 100 UNIT/ML  injection Inject 5-20 Units into the skin 3 (three) times daily before meals. Sliding scale    . losartan (COZAAR) 50 MG tablet Take 50 mg by mouth daily.    . meloxicam (MOBIC) 15 MG tablet Take 15 mg by mouth daily. ON HOLD DUE TO SURGERY    . metFORMIN (GLUCOPHAGE) 500 MG tablet Take 500 mg by mouth 3 (three) times daily.    . metoprolol (LOPRESSOR) 50 MG tablet Take 25 mg by mouth 2 (two) times daily.    . Multiple Vitamin (MULTIVITAMIN) tablet Take 1 tablet by mouth daily.    . Omega-3 Fatty Acids (FISH OIL) 300 MG CAPS Take 300 mg by mouth daily.    Marland Kitchen omeprazole (PRILOSEC) 20 MG capsule Take 20 mg by mouth daily.    . traMADol (ULTRAM) 50 MG tablet Take 50 mg by mouth daily. For pain- BACK  ( Extended Release )    . HYDROcodone-acetaminophen (NORCO/VICODIN) 5-325 MG per tablet Take 1-2 tablets by mouth every 4 (four) hours as needed for pain. (Patient not taking: Reported on 08/03/2015) 60 tablet 0   No current facility-administered medications for this visit.     Review of Systems : See HPI for pertinent positives and negatives.  Physical Examination  Vitals:   08/04/16 1417 08/04/16 1420  BP: (!) 158/58 (!) 166/66  Pulse: 61   Resp: 18   Temp: 98 F (36.7 C)   TempSrc: Oral   SpO2: 100%   Weight: 182 lb 14.4 oz (83 kg)   Height: 5\' 5"  (1.651 m)    Body mass index is 30.44 kg/m.  General: WDWN obese female in NAD GAIT: normal Eyes: PERRLA Pulmonary: Non-labored respirations, CTAB, no rales,  rhonchi, or wheezing.  Cardiac: regular rhythm, no detected murmur.  VASCULAR EXAM Carotid Bruits Right Left   Negative Negative   Aorta is not palpable. Radial pulses are 2+ palpable and equal.      LE Pulses Right Left   POPLITEAL not palpable  not palpable   POSTERIOR TIBIAL not palpable  palpable     DORSALIS PEDIS  ANTERIOR TIBIAL faintly palpable  faintly palpable     Gastrointestinal: soft, nontender, BS WNL, no r/g, no palpated masses.  Musculoskeletal: No muscle atrophy/wasting. M/S 5/5 throughout, Extremities without ischemic changes.  Neurologic: A&O X 3; Appropriate Affect, Speech is normal CN 2-12 intact, Pain and light touch intact in extremities, Motor exam as listed above.     Assessment: KEIONDRA BROOKOVER is a 76 y.o. female who is status post right CEA in 2007.  She has no history of stroke or TIA.  DATA Today's carotid duplex suggests no restenosis of the right ICA (CEA site).  Left external carotid artery stenosis >50%. Left internal carotid artery stenosis at 40%-59%.  Bilateral vertebral artery flow is antegrade.  Bilateral subclavian artery waveforms are normal.  Essentially unchanged since study on 08-03-2015.    Plan: Follow-up in 1 year with Carotid Duplex scan.   I discussed in depth with the patient the nature of atherosclerosis, and emphasized the importance of maximal medical management including strict control of blood pressure, blood glucose, and lipid levels, obtaining regular exercise, and continued cessation of smoking.  The patient is aware that without maximal medical management the underlying atherosclerotic disease process will progress, limiting the benefit of any interventions. The patient was given information about stroke prevention and what symptoms should prompt the patient to seek immediate medical care. Thank you for allowing [place] to participate in this patient's care.  Clemon Chambers, RN, MSN, FNP-C [facility] Office: [facility] Physician: Trula Slade  08/04/16 2:22 PM

## 2016-08-04 NOTE — Patient Instructions (Signed)
Stroke Prevention Some medical conditions and behaviors are associated with an increased chance of having a stroke. You may prevent a stroke by making healthy choices and managing medical conditions. How can I reduce my risk of having a stroke?  Stay physically active. Get at least 30 minutes of activity on most or all days.  Do not smoke. It may also be helpful to avoid exposure to secondhand smoke.  Limit alcohol use. Moderate alcohol use is considered to be:  No more than 2 drinks per day for men.  No more than 1 drink per day for nonpregnant women.  Eat healthy foods. This involves:  Eating 5 or more servings of fruits and vegetables a day.  Making dietary changes that address high blood pressure (hypertension), high cholesterol, diabetes, or obesity.  Manage your cholesterol levels.  Making food choices that are high in fiber and low in saturated fat, trans fat, and cholesterol may control cholesterol levels.  Take any prescribed medicines to control cholesterol as directed by your health care provider.  Manage your diabetes.  Controlling your carbohydrate and sugar intake is recommended to manage diabetes.  Take any prescribed medicines to control diabetes as directed by your health care provider.  Control your hypertension.  Making food choices that are low in salt (sodium), saturated fat, trans fat, and cholesterol is recommended to manage hypertension.  Ask your health care provider if you need treatment to lower your blood pressure. Take any prescribed medicines to control hypertension as directed by your health care provider.  If you are 18-39 years of age, have your blood pressure checked every 3-5 years. If you are 40 years of age or older, have your blood pressure checked every year.  Maintain a healthy weight.  Reducing calorie intake and making food choices that are low in sodium, saturated fat, trans fat, and cholesterol are recommended to manage  weight.  Stop drug abuse.  Avoid taking birth control pills.  Talk to your health care provider about the risks of taking birth control pills if you are over 35 years old, smoke, get migraines, or have ever had a blood clot.  Get evaluated for sleep disorders (sleep apnea).  Talk to your health care provider about getting a sleep evaluation if you snore a lot or have excessive sleepiness.  Take medicines only as directed by your health care provider.  For some people, aspirin or blood thinners (anticoagulants) are helpful in reducing the risk of forming abnormal blood clots that can lead to stroke. If you have the irregular heart rhythm of atrial fibrillation, you should be on a blood thinner unless there is a good reason you cannot take them.  Understand all your medicine instructions.  Make sure that other conditions (such as anemia or atherosclerosis) are addressed. Get help right away if:  You have sudden weakness or numbness of the face, arm, or leg, especially on one side of the body.  Your face or eyelid droops to one side.  You have sudden confusion.  You have trouble speaking (aphasia) or understanding.  You have sudden trouble seeing in one or both eyes.  You have sudden trouble walking.  You have dizziness.  You have a loss of balance or coordination.  You have a sudden, severe headache with no known cause.  You have new chest pain or an irregular heartbeat. Any of these symptoms may represent a serious problem that is an emergency. Do not wait to see if the symptoms will go away.   Get medical help at once. Call your local emergency services (911 in [place] Do not drive yourself to the hospital. This information is not intended to replace advice given to you by your health care provider. Make sure you discuss any questions you have with your health care provider. Document Released: 06/12/2004 Document Revised: 10/11/2015 Document Reviewed: 11/05/2012 Elsevier  Interactive Patient Education  2017 Elsevier Inc.      Preventing Cerebrovascular Disease Arteries are blood vessels that carry blood that contains oxygen from the heart to all parts of the body. Cerebrovascular disease affects arteries that supply the brain. Any condition that blocks or disrupts blood flow to the brain can cause cerebrovascular disease. Brain cells that lose blood supply start to die within minutes (stroke). Stroke is the main danger of cerebrovascular disease. Atherosclerosis and high blood pressure are common causes of cerebrovascular disease. Atherosclerosis is narrowing and hardening of an artery that results when fat, cholesterol, calcium, or other substances (plaque) build up inside an artery. Plaque reduces blood flow through the artery. High blood pressure increases the risk of bleeding inside the brain. Making diet and lifestyle changes to prevent atherosclerosis and high blood pressure lowers your risk of cerebrovascular disease. What nutrition changes can be made?  Eat more fruits, vegetables, and whole grains.  Reduce how much saturated fat you eat. To do this, eat less red meat and fewer full-fat dairy products.  Eat healthy proteins instead of red meat. Healthy proteins include:  Fish. Eat fish that contains heart-healthy omega-3 fatty acids, twice a week. Examples include salmon, albacore tuna, mackerel, and herring.  Chicken.  Nuts.  Low-fat or nonfat yogurt.  Avoid processed meats, like bacon and lunchmeat.  Avoid foods that contain:  A lot of sugar, such as sweets and drinks with added sugar.  A lot of salt (sodium). Avoid adding extra salt to your food, as told by your health care provider.  Trans fats, such as margarine and baked goods. Trans fats may be listed as "partially hydrogenated oils" on food labels.  Check food labels to see how much sodium, sugar, and trans fats are in foods.  Use vegetable oils that contain low amounts of  saturated fat, such as olive oil or canola oil. What lifestyle changes can be made?  Drink alcohol in moderation. This means no more than 1 drink a day for nonpregnant women and 2 drinks a day for men. One drink equals 12 oz of beer, 5 oz of wine, or 1 oz of hard liquor.  If you are overweight, ask your health care provider to recommend a weight-loss plan for you. Losing 5-10 lb (2.2-4.5 kg) can reduce your risk of diabetes, atherosclerosis, and high blood pressure.  Exercise for 30?60 minutes on most days, or as much as told by your health care provider.  Do moderate-intensity exercise, such as brisk walking, bicycling, and water aerobics. Ask your health care provider which activities are safe for you.  Do not use any products that contain nicotine or tobacco, such as cigarettes and e-cigarettes. If you need help quitting, ask your health care provider. Why are these changes important? Making these changes lowers your risk of many diseases that can cause cerebrovascular disease and stroke. Stroke is a leading cause of death and disability. Making these changes also improves your overall health and quality of life. What can I do to lower my risk? The following factors make you more likely to develop cerebrovascular disease:  Being overweight.  Smoking.  Being physically   inactive.  Eating a high-fat diet.  Having certain health conditions, such as:  Diabetes.  High blood pressure.  Heart disease.  Atherosclerosis.  High cholesterol.  Sickle cell disease. Talk with your health care provider about your risk for cerebrovascular disease. Work with your health care provider to control diseases that you have that may contribute to cerebrovascular disease. Your health care provider may prescribe medicines to help prevent major causes of cerebrovascular disease. Where to find more information: Learn more about preventing cerebrovascular disease from:  National Heart, Lung, and  Blood Institute: www.nhlbi.nih.gov/health/health-topics/topics/stroke  Centers for Disease Control and Prevention: cdc.gov/stroke/about.htm Summary  Cerebrovascular disease can lead to a stroke.  Atherosclerosis and high blood pressure are major causes of cerebrovascular disease.  Making diet and lifestyle changes can reduce your risk of cerebrovascular disease.  Work with your health care provider to get your risk factors under control to reduce your risk of cerebrovascular disease. This information is not intended to replace advice given to you by your health care provider. Make sure you discuss any questions you have with your health care provider. Document Released: 05/20/2015 Document Revised: 11/23/2015 Document Reviewed: 05/20/2015 Elsevier Interactive Patient Education  2017 Elsevier Inc.  

## 2016-08-05 NOTE — Addendum Note (Signed)
Addended by: Lianne Cure A on: 08/05/2016 10:15 AM   Modules accepted: Orders

## 2016-08-20 HISTORY — PX: CATARACT EXTRACTION: SUR2

## 2016-10-11 HISTORY — PX: CATARACT EXTRACTION: SUR2

## 2016-12-23 ENCOUNTER — Other Ambulatory Visit: Payer: Self-pay | Admitting: Internal Medicine

## 2016-12-23 DIAGNOSIS — Z1231 Encounter for screening mammogram for malignant neoplasm of breast: Secondary | ICD-10-CM

## 2017-01-28 ENCOUNTER — Ambulatory Visit
Admission: RE | Admit: 2017-01-28 | Discharge: 2017-01-28 | Disposition: A | Discharge status: Home / Self Care | Payer: Medicare Other | Source: Ambulatory Visit | Attending: Internal Medicine | Admitting: Internal Medicine

## 2017-01-28 DIAGNOSIS — Z1231 Encounter for screening mammogram for malignant neoplasm of breast: Secondary | ICD-10-CM

## 2017-05-19 DIAGNOSIS — I480 Paroxysmal atrial fibrillation: Secondary | ICD-10-CM

## 2017-05-19 HISTORY — DX: Paroxysmal atrial fibrillation: I48.0

## 2017-05-28 ENCOUNTER — Encounter (HOSPITAL_COMMUNITY): Payer: Self-pay

## 2017-05-28 ENCOUNTER — Emergency Department (HOSPITAL_COMMUNITY)
Admission: EM | Admit: 2017-05-28 | Discharge: 2017-05-28 | Disposition: A | Discharge status: Home / Self Care | Payer: Medicare Other | Attending: Emergency Medicine | Admitting: Emergency Medicine

## 2017-05-28 ENCOUNTER — Emergency Department (HOSPITAL_COMMUNITY): Payer: Medicare Other

## 2017-05-28 DIAGNOSIS — Z79899 Other long term (current) drug therapy: Secondary | ICD-10-CM | POA: Diagnosis not present

## 2017-05-28 DIAGNOSIS — E109 Type 1 diabetes mellitus without complications: Secondary | ICD-10-CM | POA: Insufficient documentation

## 2017-05-28 DIAGNOSIS — R002 Palpitations: Secondary | ICD-10-CM | POA: Diagnosis present

## 2017-05-28 DIAGNOSIS — Z794 Long term (current) use of insulin: Secondary | ICD-10-CM | POA: Diagnosis not present

## 2017-05-28 DIAGNOSIS — Z87891 Personal history of nicotine dependence: Secondary | ICD-10-CM | POA: Insufficient documentation

## 2017-05-28 DIAGNOSIS — R7989 Other specified abnormal findings of blood chemistry: Secondary | ICD-10-CM | POA: Diagnosis not present

## 2017-05-28 DIAGNOSIS — Z7982 Long term (current) use of aspirin: Secondary | ICD-10-CM | POA: Insufficient documentation

## 2017-05-28 DIAGNOSIS — R0602 Shortness of breath: Secondary | ICD-10-CM | POA: Insufficient documentation

## 2017-05-28 LAB — CBC
HEMATOCRIT: 35.5 % — AB (ref 36.0–46.0)
Hemoglobin: 10.6 g/dL — ABNORMAL LOW (ref 12.0–15.0)
MCH: 26.9 pg (ref 26.0–34.0)
MCHC: 29.9 g/dL — ABNORMAL LOW (ref 30.0–36.0)
MCV: 90.1 fL (ref 78.0–100.0)
PLATELETS: 283 10*3/uL (ref 150–400)
RBC: 3.94 MIL/uL (ref 3.87–5.11)
RDW: 14.7 % (ref 11.5–15.5)
WBC: 10.3 10*3/uL (ref 4.0–10.5)

## 2017-05-28 LAB — BASIC METABOLIC PANEL
Anion gap: 11 (ref 5–15)
BUN: 29 mg/dL — AB (ref 6–20)
CO2: 24 mmol/L (ref 22–32)
CREATININE: 1.63 mg/dL — AB (ref 0.44–1.00)
Calcium: 8.7 mg/dL — ABNORMAL LOW (ref 8.9–10.3)
Chloride: 106 mmol/L (ref 101–111)
GFR calc non Af Amer: 29 mL/min — ABNORMAL LOW (ref >60)
GFR, EST AFRICAN AMERICAN: 34 mL/min — AB (ref >60)
Glucose, Bld: 158 mg/dL — ABNORMAL HIGH (ref 65–99)
POTASSIUM: 3.9 mmol/L (ref 3.5–5.1)
Sodium: 141 mmol/L (ref 135–145)

## 2017-05-28 LAB — D-DIMER, QUANTITATIVE (NOT AT ARMC): D DIMER QUANT: 3.09 ug{FEU}/mL — AB (ref 0.00–0.50)

## 2017-05-28 LAB — I-STAT TROPONIN, ED: Troponin i, poc: 0 ng/mL (ref 0.00–0.08)

## 2017-05-28 LAB — CBG MONITORING, ED
GLUCOSE-CAPILLARY: 78 mg/dL (ref 65–99)
GLUCOSE-CAPILLARY: 66 mg/dL (ref 65–99)
GLUCOSE-CAPILLARY: 120 mg/dL — AB (ref 65–99)

## 2017-05-28 LAB — BRAIN NATRIURETIC PEPTIDE: B Natriuretic Peptide: 545.3 pg/mL — ABNORMAL HIGH (ref 0.0–100.0)

## 2017-05-28 MED ORDER — TECHNETIUM TC 99M DIETHYLENETRIAME-PENTAACETIC ACID
30.0000 | Freq: Once | INTRAVENOUS | Status: AC | PRN
  Administered 2017-05-28: 30 via RESPIRATORY_TRACT

## 2017-05-28 MED ORDER — TECHNETIUM TO 99M ALBUMIN AGGREGATED
4.0000 | Freq: Once | INTRAVENOUS | Status: AC | PRN
  Administered 2017-05-28: 4 via INTRAVENOUS

## 2017-05-28 NOTE — ED Notes (Signed)
Pt ambulated around the unit and back independently with pulse ox. Spo2 remained at 97% the entire time. Pt denies shob or any other sx and states "I feel good" PA in room with this Rn to discuss d/c instructions and pt has no further questions. Pt to follow up with cardiology and pcp. VSS, NAD.

## 2017-05-28 NOTE — ED Notes (Signed)
Pt put on 2L O2 per MD.

## 2017-05-28 NOTE — ED Notes (Signed)
Patient reports headache-she states its from "hunger"

## 2017-05-28 NOTE — ED Notes (Signed)
Pt.blood work was done at 1058am.click at [phone]

## 2017-05-28 NOTE — ED Notes (Signed)
Patient denies pain and is resting comfortably.  

## 2017-05-28 NOTE — ED Notes (Signed)
PT transported to [facility]

## 2017-05-28 NOTE — ED Provider Notes (Signed)
[facility] EMERGENCY DEPARTMENT Provider Note   CSN: [id number] Arrival date & time: 05/28/17  1045     History   Chief Complaint Chief Complaint  Patient presents with  . Atrial Fibrillation    HPI Gloria [name] is a 77 y.o. female with past medical history of paroxysmal atrial fibrillation, hypertension, carotid artery occlusion, lipidemia, type 1 diabetes, presenting to the ED with intermittent episodes of palpitations that feel like atrial fibrillation, since Monday.  Patient states she also has had dyspnea on exertion and shortness of breath while lying flat since Monday as well.  She states this is new.  Reports associated wheezing when laying flat and pressure in her back.  She has gained 5-6 pounds in the past week, with no significant increase in her lower extremity edema.  No history of cardiac echo or known heart failure.  Denies chest pain, cough, fever, or other complaints. Patient reports getting yearly Dopplers of her carotids due to her history of occlusion.  Does not take anticoagulation, other than daily aspirin. No hx of DVT/PE. 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  The history is provided by the patient.   Past Medical History:  Diagnosis Date  . Arthritis   . Atrial fibrillation ([place])   . Carotid artery occlusion   . Diabetes mellitus   . GERD (gastroesophageal reflux disease)   . Hyperlipidemia   . Hypertension   . Peripheral vascular disease (H[facility])    hx rt carotid s/p  . Pneumonia    hx  . PONV (postoperative nausea and vomiting)     Patient Active Problem List   Diagnosis Date Noted  . Occlusion and stenosis of carotid artery without mention of cerebral infarction 06/24/2012  . DIABETES MELLITUS, TYPE I 01/14/2010  . HYPERLIPIDEMIA 01/14/2010  . HYPERTENSION 01/14/2010  . PARONYCHIA, FINGER 01/14/2010  . DERMATITIS 01/14/2010    Past Surgical History:  Procedure Laterality Date  . CAROTID ENDARTERECTOMY  68    rt  . CAROTID ENDARTERECTOMY  Jan. 19, 2007   Right  cea  . CHOLECYSTECTOMY  68  . LUMBAR LAMINECTOMY/DECOMPRESSION MICRODISCECTOMY  02/27/2012   Procedure: LUMBAR LAMINECTOMY/DECOMPRESSION MICRODISCECTOMY 2 LEVELS;  Surgeon: Kristeen Miss, MD;  Location: [facility];  Service: Neurosurgery;  Laterality: N/A;  Lumbar two-three, four-five Laminectomy/foraminotomy  . SPINAL CORD DECOMPRESSION  Oct. 2013    OB History    No data available       Home Medications    Prior to Admission medications   Medication Sig Start Date End Date Taking? Authorizing Provider  Acetaminophen (TYLENOL PO) Take 1 tablet by mouth as needed (Pain).   Yes [provider]  aspirin 325 MG tablet Take 325 mg by mouth daily. HOLD DUE TO SURGERY   Yes [provider]  atorvastatin (LIPITOR) 80 MG tablet Take 80 mg by mouth daily.   Yes [provider]  carboxymethylcellulose (REFRESH PLUS) 0.5 % SOLN Place 1 drop into both eyes daily.   Yes [provider]  diphenhydrAMINE (BENADRYL) 25 MG tablet Take 25 mg by mouth every 6 (six) hours as needed for allergies.   Yes [provider]  fenofibrate 160 MG tablet Take 160 mg by mouth daily.   Yes [provider]  hydrochlorothiazide (HYDRODIURIL) 25 MG tablet Take 25 mg by mouth daily.   Yes [provider]  insulin glargine (LANTUS) 100 UNIT/ML injection Inject 34 Units into the skin at bedtime.  Yes [provider]  JANUVIA 100 MG tablet Take 100 mg by mouth daily. 05/20/17  Yes [provider]  lidocaine (LIDODERM) 5 % Place 1 patch onto the skin as needed (Back Pain). Remove & Discard patch within 12 hours or as directed by MD   Yes [provider]  losartan (COZAAR) 50 MG tablet Take 50 mg by mouth daily.   Yes [provider]  Menthol, Topical Analgesic, (BIOFREEZE EX) Apply 1 application topically as needed (Back and Knee pain).   Yes [provider]    metFORMIN (GLUCOPHAGE) 500 MG tablet Take 500 mg by mouth 3 (three) times daily.   Yes [provider]  metoprolol (LOPRESSOR) 50 MG tablet Take 50 mg by mouth 2 (two) times daily.    Yes [provider]  Omega-3 Fatty Acids (FISH OIL) 300 MG CAPS Take 300 mg by mouth 2 (two) times daily.    Yes [provider]  omeprazole (PRILOSEC) 20 MG capsule Take 20 mg by mouth daily.   Yes [provider]  diazepam (VALIUM) 5 MG tablet Take 1 tablet (5 mg total) by mouth every 6 (six) hours as needed. Patient not taking: Reported on 05/28/2017 02/28/12   Erline Levine, MD  HYDROcodone-acetaminophen (NORCO/VICODIN) 5-325 MG per tablet Take 1-2 tablets by mouth every 4 (four) hours as needed for pain. 02/28/12   Erline Levine, MD  meloxicam (MOBIC) 15 MG tablet Take 15 mg by mouth as needed for pain. ON HOLD DUE TO SURGERY    [provider]  traMADol (ULTRAM) 50 MG tablet Take 50 mg by mouth daily. For pain- BACK  ( Extended Release )    [provider]    Family History Family History  Problem Relation Age of Onset  . Heart disease Mother        Heart Disease before age 46  . Heart disease Father   . Heart disease Sister        Heart Disease before age 4  . Breast cancer Daughter 7    Social History Social History   Tobacco Use  . Smoking status: Former Smoker    Packs/day: 0.25    Years: 32.00    Pack years: 8.00    Types: Cigarettes    Last attempt to quit: 02/20/2004    Years since quitting: 13.2  . Smokeless tobacco: Never Used  Substance Use Topics  . Alcohol use: No  . Drug use: No     Allergies   Iodinated diagnostic agents; Hibiclens [chlorhexidine gluconate]; Iohexol; Levaquin [levofloxacin in d5w]; Oxycodone-acetaminophen; and Propoxyphene n-acetaminophen   Review of Systems Review of Systems  Constitutional: Positive for unexpected weight change. Negative for diaphoresis and fever.  Respiratory: Positive for  shortness of breath and wheezing. Negative for cough.   Cardiovascular: Positive for palpitations. Negative for chest pain and leg swelling.  Gastrointestinal: Negative for abdominal pain and nausea.  Musculoskeletal: Positive for back pain.  All other systems reviewed and are negative.    Physical Exam Updated Vital Signs BP (!) 148/67 (BP Location: Right Arm)   Pulse 78   Temp 98.1 F (36.7 C) (Oral)   Resp 15   Ht 5\' 4"  (1.626 m)   Wt 78.5 kg (173 lb)   SpO2 98%   BMI 29.70 kg/m   Physical Exam  Constitutional: She appears well-developed and well-nourished. No distress.  HENT:  Head: Normocephalic and atraumatic.  Mouth/Throat: Oropharynx is clear and moist.  Eyes: Conjunctivae are normal.  Neck: Normal range of motion. Neck supple. No JVD present.  Cardiovascular: Normal rate, regular rhythm, normal heart sounds and intact distal pulses. Exam reveals no gallop and no friction rub.  No murmur heard. Pulmonary/Chest: Effort normal. No stridor. No respiratory distress. She has no wheezes. She has rales (RLL).  Abdominal: Soft. Bowel sounds are normal. She exhibits no distension. There is no tenderness.  Musculoskeletal: Normal range of motion. She exhibits no edema.  No significant LE edema or tenderness  Neurological: She is alert.  Skin: Skin is warm.  Psychiatric: She has a normal mood and affect. Her behavior is normal.  Nursing note and vitals reviewed.    ED Treatments / Results  Labs (all labs ordered are listed, but only abnormal results are displayed) Labs Reviewed  BASIC METABOLIC PANEL - Abnormal; Notable for the following components:      Result Value   Glucose, Bld 158 (*)    BUN 29 (*)    Creatinine, Ser 1.63 (*)    Calcium 8.7 (*)    GFR calc non Af Amer 29 (*)    GFR calc Af Amer 34 (*)    All other components within normal limits  CBC - Abnormal; Notable for the following components:   Hemoglobin 10.6 (*)    HCT 35.5 (*)    MCHC 29.9 (*)     All other components within normal limits  BRAIN NATRIURETIC PEPTIDE - Abnormal; Notable for the following components:   B Natriuretic Peptide 545.3 (*)    All other components within normal limits  D-DIMER, QUANTITATIVE (NOT AT [facility]) - Abnormal; Notable for the following components:   D-Dimer, Quant 3.09 (*)    All other components within normal limits  CBG MONITORING, ED - Abnormal; Notable for the following components:   Glucose-Capillary 120 (*)    All other components within normal limits  I-STAT TROPONIN, ED  CBG MONITORING, ED  CBG MONITORING, ED    EKG  EKG Interpretation  Date/Time:  Thursday May 28 2017 10:49:36 EST Ventricular Rate:  77 PR Interval:  188 QRS Duration: 74 QT Interval:  420 QTC Calculation: 475 R Axis:   33 Text Interpretation:  Normal sinus rhythm Possible Anterior infarct , age undetermined Abnormal ECG No significant change since last tracing Confirmed by Alfonzo Beers [phone]) on 05/28/2017 12:24:01 PM       Radiology Dg Chest 2 View  Result Date: 05/28/2017 CLINICAL DATA:  Atrial fibrillation.  Shortness of breath. EXAM: CHEST  2 VIEW COMPARISON:  CT 02/09/2012. FINDINGS: Mediastinum and hilar structures normal. Cardiomegaly with normal pulmonary vascularity. Low lung volumes with mild basilar atelectasis. No pleural effusion or pneumothorax. Thoracic spine scoliosis. IMPRESSION: 1.  Cardiomegaly. 2. Low lung volumes with mild basilar atelectasis. Small bilateral pleural effusions. Electronically Signed   By: Marcello Moores  Register   On: 05/28/2017 11:47   Nm Pulmonary Vent And Perf (v/q Scan)  Result Date: 05/28/2017 CLINICAL DATA:  Atrial fibrillation. Elevated D-dimer. Shortness of breath and cough. EXAM: NUCLEAR MEDICINE VENTILATION - PERFUSION LUNG SCAN TECHNIQUE: Ventilation images were obtained in multiple projections using inhaled aerosol Tc-100m DTPA. Perfusion images were obtained in multiple projections after intravenous injection of Tc-67m  MAA. RADIOPHARMACEUTICALS:  22.3 mCi Technetium-4m DTPA aerosol inhalation and 3.7 mCi Technetium-21m MAA IV COMPARISON:  Chest x-ray from earlier same day. FINDINGS: Ventilation: No focal ventilation defect. Perfusion: No wedge shaped peripheral perfusion defects to suggest acute pulmonary embolism. IMPRESSION: No evidence of pulmonary embolism. Electronically Signed   By:  Franki Cabot M.D.   On: 05/28/2017 19:49    Procedures Procedures (including critical care time)  Medications Ordered in ED Medications  technetium TC 9M diethylenetriame-pentaacetic acid (DTPA) injection 30 millicurie (30 millicuries Inhalation Given 05/28/17 1851)  technetium albumin aggregated (MAA) injection solution 4 millicurie (4 millicuries Intravenous Contrast Given 05/28/17 1852)     Initial Impression / Assessment and Plan / ED Course  I have reviewed the triage vital signs and the nursing notes.  Pertinent labs & imaging results that were available during my care of the patient were reviewed by me and considered in my medical decision making (see chart for details).  Clinical Course as of May 28 2022  Thu May 28, 2017  1535 Discussed lab results with patient, recommendation for VQ scan as patient's kidney function does not allow for CTA.  Patient agreeable to plan.  If VQ scan negative, plan to ambulate in the ED for disposition.  [JR]  2002 VQ scan negative for PE. Discussed results with patient. [place] removed, pt O2 sats remaining at 97%. Will ambulate patient  [JR]    Clinical Course User Index [JR] Eagan Shifflett, [place] N, PA-C   Pt presenting with PND and DOE since Monday, and 5-6lb weight gain in 1 week. No known hx of CHF. Denies CP. Hx of paroxysmal a fib, with pt reporting she has been going in and out of a fib throughout the week. Daily ASA, no other anticoagulation. Labs in triage reveal AKI 1.63. Trop neg. EKG in NSR. CXR with small b/l pleural effusions. Lungs with mild rales RLL. No inc work of  breathing, O2 sat 98% on RA. Will obtain BNP and d-dimer.   BNP 545. D-Dimer 3.09. Pt's kidney function does not allow for CTA. VQ scan ordered.   Pt hypoxic in high 80s while resting (not sleeping), placed on [place] with improvement.   Patient discussed with and seen by Dr. Marcha Dutton. Plan discussed with Dr. Marcha Dutton; if VQ scan neg,  plan to ambulate pt on pulse ox. If pt significantly dyspneic or not maintaining O2 saturation, will admit. Plan to re-evaluate after VQ scan.   8:24 PM VQ scan negative. Will ambulate pt on pulse ox.   8:24 PM Pt ambulated with O2 saturation remaining at 97% on RA. Pt denies SOB. Discussed importance of PCP follow up to recheck Cr by Monday at the latest. Also provided Cardiology referral to establish care and follow up on visit. Strict return precautions discussed. Pt and family agreeable to plan. Pt is a retired Warden/ranger, and seems very reliable for follow up.  Discussed results, findings, treatment and follow up. Patient advised of return precautions. Patient verbalized understanding and agreed with plan.  Final Clinical Impressions(s) / ED Diagnoses   Final diagnoses:  Elevated brain natriuretic peptide (BNP) level  Shortness of breath  Elevated serum creatinine    ED Discharge Orders    None       Labella Zahradnik, [place] N, PA-C 05/28/17 2025    Pixie Casino, MD 06/12/17 [id number]

## 2017-05-28 NOTE — ED Notes (Signed)
2Lit [place] given to patient per MD requests. Patient desats at asleep

## 2017-05-28 NOTE — ED Triage Notes (Signed)
Per Pt, Pt is coming from home with complaints of atrial fibrillation that reoccurred after several years ago. Reports some SOB and wheezing since she noted the irregular Heart rate. Sent over by MD to be evaluated. Reports some pressure behind both shoulder blades, but denies any direct chest pain.

## 2017-05-28 NOTE — ED Notes (Signed)
Pt back.

## 2017-05-28 NOTE — Discharge Instructions (Signed)
Please read instructions below.  It is important that you follow up with your primary care provider by Monday, at the latest, to recheck you Creatinine level (kidney function). Make sure to discuss your visit today with your primary care. It is important that you establish care with a Cardiologist for evaluation of your heart. Return to the ER for new or worsening symptoms; including worsening shortness of breath, chest pain, pain that radiates to the arm or neck.

## 2017-06-01 ENCOUNTER — Emergency Department (HOSPITAL_COMMUNITY): Payer: Medicare Other

## 2017-06-01 ENCOUNTER — Inpatient Hospital Stay (HOSPITAL_COMMUNITY)
Admission: EM | Admit: 2017-06-01 | Discharge: 2017-06-09 | DRG: 308 | Disposition: A | Discharge status: Home / Self Care | Payer: Medicare Other | Attending: Internal Medicine | Admitting: Internal Medicine

## 2017-06-01 ENCOUNTER — Other Ambulatory Visit: Payer: Self-pay

## 2017-06-01 ENCOUNTER — Encounter (HOSPITAL_COMMUNITY): Payer: Self-pay

## 2017-06-01 DIAGNOSIS — I5031 Acute diastolic (congestive) heart failure: Secondary | ICD-10-CM | POA: Diagnosis not present

## 2017-06-01 DIAGNOSIS — E1169 Type 2 diabetes mellitus with other specified complication: Secondary | ICD-10-CM

## 2017-06-01 DIAGNOSIS — R0902 Hypoxemia: Secondary | ICD-10-CM | POA: Diagnosis present

## 2017-06-01 DIAGNOSIS — Z7901 Long term (current) use of anticoagulants: Secondary | ICD-10-CM

## 2017-06-01 DIAGNOSIS — E875 Hyperkalemia: Secondary | ICD-10-CM | POA: Diagnosis not present

## 2017-06-01 DIAGNOSIS — I481 Persistent atrial fibrillation: Secondary | ICD-10-CM | POA: Diagnosis present

## 2017-06-01 DIAGNOSIS — Z7984 Long term (current) use of oral hypoglycemic drugs: Secondary | ICD-10-CM | POA: Diagnosis not present

## 2017-06-01 DIAGNOSIS — I10 Essential (primary) hypertension: Secondary | ICD-10-CM | POA: Diagnosis not present

## 2017-06-01 DIAGNOSIS — Z7982 Long term (current) use of aspirin: Secondary | ICD-10-CM | POA: Diagnosis not present

## 2017-06-01 DIAGNOSIS — E1122 Type 2 diabetes mellitus with diabetic chronic kidney disease: Secondary | ICD-10-CM | POA: Diagnosis present

## 2017-06-01 DIAGNOSIS — E669 Obesity, unspecified: Secondary | ICD-10-CM | POA: Diagnosis present

## 2017-06-01 DIAGNOSIS — Z6828 Body mass index (BMI) 28.0-28.9, adult: Secondary | ICD-10-CM

## 2017-06-01 DIAGNOSIS — I4891 Unspecified atrial fibrillation: Secondary | ICD-10-CM | POA: Diagnosis present

## 2017-06-01 DIAGNOSIS — I4892 Unspecified atrial flutter: Secondary | ICD-10-CM | POA: Diagnosis present

## 2017-06-01 DIAGNOSIS — I509 Heart failure, unspecified: Secondary | ICD-10-CM | POA: Diagnosis not present

## 2017-06-01 DIAGNOSIS — N179 Acute kidney failure, unspecified: Secondary | ICD-10-CM | POA: Diagnosis present

## 2017-06-01 DIAGNOSIS — I5033 Acute on chronic diastolic (congestive) heart failure: Secondary | ICD-10-CM | POA: Diagnosis present

## 2017-06-01 DIAGNOSIS — Z888 Allergy status to other drugs, medicaments and biological substances status: Secondary | ICD-10-CM

## 2017-06-01 DIAGNOSIS — Z881 Allergy status to other antibiotic agents status: Secondary | ICD-10-CM

## 2017-06-01 DIAGNOSIS — E11319 Type 2 diabetes mellitus with unspecified diabetic retinopathy without macular edema: Secondary | ICD-10-CM | POA: Diagnosis present

## 2017-06-01 DIAGNOSIS — N183 Chronic kidney disease, stage 3 unspecified: Secondary | ICD-10-CM | POA: Diagnosis present

## 2017-06-01 DIAGNOSIS — R001 Bradycardia, unspecified: Secondary | ICD-10-CM | POA: Diagnosis not present

## 2017-06-01 DIAGNOSIS — Z79899 Other long term (current) drug therapy: Secondary | ICD-10-CM

## 2017-06-01 DIAGNOSIS — K219 Gastro-esophageal reflux disease without esophagitis: Secondary | ICD-10-CM | POA: Diagnosis present

## 2017-06-01 DIAGNOSIS — E785 Hyperlipidemia, unspecified: Secondary | ICD-10-CM | POA: Diagnosis present

## 2017-06-01 DIAGNOSIS — E1151 Type 2 diabetes mellitus with diabetic peripheral angiopathy without gangrene: Secondary | ICD-10-CM | POA: Diagnosis present

## 2017-06-01 DIAGNOSIS — Z87891 Personal history of nicotine dependence: Secondary | ICD-10-CM | POA: Diagnosis not present

## 2017-06-01 DIAGNOSIS — G4733 Obstructive sleep apnea (adult) (pediatric): Secondary | ICD-10-CM | POA: Diagnosis present

## 2017-06-01 DIAGNOSIS — Z91041 Radiographic dye allergy status: Secondary | ICD-10-CM

## 2017-06-01 DIAGNOSIS — H3530 Unspecified macular degeneration: Secondary | ICD-10-CM | POA: Diagnosis present

## 2017-06-01 DIAGNOSIS — I130 Hypertensive heart and chronic kidney disease with heart failure and stage 1 through stage 4 chronic kidney disease, or unspecified chronic kidney disease: Secondary | ICD-10-CM | POA: Diagnosis present

## 2017-06-01 DIAGNOSIS — Z885 Allergy status to narcotic agent status: Secondary | ICD-10-CM

## 2017-06-01 DIAGNOSIS — I959 Hypotension, unspecified: Secondary | ICD-10-CM | POA: Diagnosis present

## 2017-06-01 HISTORY — DX: Unspecified macular degeneration: H35.30

## 2017-06-01 HISTORY — DX: Type 2 diabetes mellitus with unspecified diabetic retinopathy without macular edema: E11.319

## 2017-06-01 LAB — I-STAT TROPONIN, ED: TROPONIN I, POC: 0 ng/mL (ref 0.00–0.08)

## 2017-06-01 LAB — BRAIN NATRIURETIC PEPTIDE: B Natriuretic Peptide: 1532.3 pg/mL — ABNORMAL HIGH (ref 0.0–100.0)

## 2017-06-01 LAB — BASIC METABOLIC PANEL
Anion gap: 15 (ref 5–15)
BUN: 32 mg/dL — AB (ref 6–20)
CALCIUM: 8.8 mg/dL — AB (ref 8.9–10.3)
CO2: 20 mmol/L — AB (ref 22–32)
CREATININE: 1.78 mg/dL — AB (ref 0.44–1.00)
Chloride: 106 mmol/L (ref 101–111)
GFR calc Af Amer: 31 mL/min — ABNORMAL LOW (ref >60)
GFR, EST NON AFRICAN AMERICAN: 26 mL/min — AB (ref >60)
GLUCOSE: 114 mg/dL — AB (ref 65–99)
Potassium: 4.5 mmol/L (ref 3.5–5.1)
Sodium: 141 mmol/L (ref 135–145)

## 2017-06-01 LAB — CBC
HCT: 36 % (ref 36.0–46.0)
HEMOGLOBIN: 11.5 g/dL — AB (ref 12.0–15.0)
MCH: 29.1 pg (ref 26.0–34.0)
MCHC: 31.9 g/dL (ref 30.0–36.0)
MCV: 91.1 fL (ref 78.0–100.0)
Platelets: 381 10*3/uL (ref 150–400)
RBC: 3.95 MIL/uL (ref 3.87–5.11)
RDW: 15 % (ref 11.5–15.5)
WBC: 9.9 10*3/uL (ref 4.0–10.5)

## 2017-06-01 LAB — GLUCOSE, CAPILLARY: GLUCOSE-CAPILLARY: 148 mg/dL — AB (ref 65–99)

## 2017-06-01 LAB — HEMOGLOBIN A1C
Hgb A1c MFr Bld: 7.5 % — ABNORMAL HIGH (ref 4.8–5.6)
Mean Plasma Glucose: 168.55 mg/dL

## 2017-06-01 LAB — TSH: TSH: 3.128 u[IU]/mL (ref 0.350–4.500)

## 2017-06-01 MED ORDER — DILTIAZEM LOAD VIA INFUSION
10.0000 mg | Freq: Once | INTRAVENOUS | Status: AC
  Administered 2017-06-01: 10 mg via INTRAVENOUS
  Filled 2017-06-01: qty 10

## 2017-06-01 MED ORDER — OCUVITE-LUTEIN PO CAPS
1.0000 | ORAL_CAPSULE | Freq: Every day | ORAL | Status: DC
  Filled 2017-06-01: qty 1

## 2017-06-01 MED ORDER — FUROSEMIDE 10 MG/ML IJ SOLN
40.0000 mg | Freq: Two times a day (BID) | INTRAMUSCULAR | Status: DC
  Administered 2017-06-01 – 2017-06-02 (×2): 40 mg via INTRAVENOUS
  Filled 2017-06-01 (×2): qty 4

## 2017-06-01 MED ORDER — ACETAMINOPHEN 325 MG PO TABS: 650.0000 mg | ORAL_TABLET | ORAL | Status: DC | PRN

## 2017-06-01 MED ORDER — INSULIN ASPART 100 UNIT/ML ~~LOC~~ SOLN
0.0000 [IU] | Freq: Three times a day (TID) | SUBCUTANEOUS | Status: DC
  Administered 2017-06-02 – 2017-06-03 (×2): 3 [IU] via SUBCUTANEOUS
  Administered 2017-06-03 – 2017-06-04 (×2): 2 [IU] via SUBCUTANEOUS
  Administered 2017-06-05: 3 [IU] via SUBCUTANEOUS
  Administered 2017-06-06: 2 [IU] via SUBCUTANEOUS
  Administered 2017-06-06: 3 [IU] via SUBCUTANEOUS
  Administered 2017-06-07 (×2): 2 [IU] via SUBCUTANEOUS
  Administered 2017-06-08: 3 [IU] via SUBCUTANEOUS
  Administered 2017-06-08 – 2017-06-09 (×2): 2 [IU] via SUBCUTANEOUS

## 2017-06-01 MED ORDER — ASPIRIN EC 81 MG PO TBEC
81.0000 mg | DELAYED_RELEASE_TABLET | Freq: Every day | ORAL | Status: DC
  Administered 2017-06-02 – 2017-06-09 (×8): 81 mg via ORAL
  Filled 2017-06-01 (×8): qty 1

## 2017-06-01 MED ORDER — ONDANSETRON HCL 4 MG/2ML IJ SOLN
4.0000 mg | Freq: Four times a day (QID) | INTRAMUSCULAR | Status: DC | PRN
  Filled 2017-06-01: qty 2

## 2017-06-01 MED ORDER — DIPHENHYDRAMINE HCL 25 MG PO CAPS
25.0000 mg | ORAL_CAPSULE | Freq: Once | ORAL | Status: AC
  Administered 2017-06-01: 25 mg via ORAL
  Filled 2017-06-01: qty 1

## 2017-06-01 MED ORDER — PRESERVISION AREDS 2 PO CAPS: 1.0000 | ORAL_CAPSULE | Freq: Two times a day (BID) | ORAL | Status: DC

## 2017-06-01 MED ORDER — ATORVASTATIN CALCIUM 80 MG PO TABS
80.0000 mg | ORAL_TABLET | Freq: Every day | ORAL | Status: DC
  Administered 2017-06-02 – 2017-06-09 (×8): 80 mg via ORAL
  Filled 2017-06-01 (×8): qty 1

## 2017-06-01 MED ORDER — SODIUM CHLORIDE 0.9% FLUSH
3.0000 mL | Freq: Two times a day (BID) | INTRAVENOUS | Status: DC
  Administered 2017-06-02 – 2017-06-06 (×9): 3 mL via INTRAVENOUS

## 2017-06-01 MED ORDER — METOPROLOL TARTRATE 5 MG/5ML IV SOLN
5.0000 mg | Freq: Once | INTRAVENOUS | Status: AC
  Administered 2017-06-01: 5 mg via INTRAVENOUS
  Filled 2017-06-01: qty 5

## 2017-06-01 MED ORDER — FENOFIBRATE 160 MG PO TABS
160.0000 mg | ORAL_TABLET | Freq: Every day | ORAL | Status: DC
  Administered 2017-06-02 – 2017-06-09 (×8): 160 mg via ORAL
  Filled 2017-06-01 (×8): qty 1

## 2017-06-01 MED ORDER — HEPARIN (PORCINE) IN NACL 100-0.45 UNIT/ML-% IJ SOLN
1100.0000 [IU]/h | INTRAMUSCULAR | Status: DC
  Administered 2017-06-01: 1100 [IU]/h via INTRAVENOUS
  Filled 2017-06-01 (×2): qty 250

## 2017-06-01 MED ORDER — HEPARIN BOLUS VIA INFUSION
3500.0000 [IU] | Freq: Once | INTRAVENOUS | Status: AC
  Administered 2017-06-01: 3500 [IU] via INTRAVENOUS
  Filled 2017-06-01: qty 3500

## 2017-06-01 MED ORDER — SODIUM CHLORIDE 0.9 % IV SOLN: 250.0000 mL | INTRAVENOUS | Status: DC | PRN

## 2017-06-01 MED ORDER — PANTOPRAZOLE SODIUM 40 MG PO TBEC
40.0000 mg | DELAYED_RELEASE_TABLET | Freq: Every day | ORAL | Status: DC
  Administered 2017-06-02 – 2017-06-09 (×8): 40 mg via ORAL
  Filled 2017-06-01 (×8): qty 1

## 2017-06-01 MED ORDER — SODIUM CHLORIDE 0.9% FLUSH: 3.0000 mL | INTRAVENOUS | Status: DC | PRN

## 2017-06-01 MED ORDER — DILTIAZEM HCL 30 MG PO TABS
30.0000 mg | ORAL_TABLET | Freq: Four times a day (QID) | ORAL | Status: DC
  Filled 2017-06-01: qty 1

## 2017-06-01 MED ORDER — HYDROCODONE-ACETAMINOPHEN 5-325 MG PO TABS
1.0000 | ORAL_TABLET | ORAL | Status: DC | PRN
  Administered 2017-06-02 – 2017-06-03 (×2): 1 via ORAL
  Administered 2017-06-03 (×2): 2 via ORAL
  Administered 2017-06-04 – 2017-06-06 (×6): 1 via ORAL
  Administered 2017-06-06 – 2017-06-08 (×3): 2 via ORAL
  Filled 2017-06-01 (×2): qty 1
  Filled 2017-06-01: qty 2
  Filled 2017-06-01 (×6): qty 1
  Filled 2017-06-01: qty 2
  Filled 2017-06-01: qty 1
  Filled 2017-06-01: qty 2
  Filled 2017-06-01 (×3): qty 1
  Filled 2017-06-01: qty 2

## 2017-06-01 MED ORDER — CARBOXYMETHYLCELLULOSE SODIUM 0.5 % OP SOLN: 1.0000 [drp] | Freq: Every day | OPHTHALMIC | Status: DC

## 2017-06-01 MED ORDER — INSULIN GLARGINE 100 UNIT/ML ~~LOC~~ SOLN
34.0000 [IU] | Freq: Every day | SUBCUTANEOUS | Status: DC
  Administered 2017-06-01 – 2017-06-02 (×2): 34 [IU] via SUBCUTANEOUS
  Filled 2017-06-01 (×3): qty 0.34

## 2017-06-01 MED ORDER — SODIUM CHLORIDE 0.9 % IV BOLUS (SEPSIS)
500.0000 mL | Freq: Once | INTRAVENOUS | Status: AC
  Administered 2017-06-01: 500 mL via INTRAVENOUS

## 2017-06-01 MED ORDER — POLYVINYL ALCOHOL 1.4 % OP SOLN
1.0000 [drp] | Freq: Every day | OPHTHALMIC | Status: DC
  Administered 2017-06-02 – 2017-06-09 (×8): 1 [drp] via OPHTHALMIC
  Filled 2017-06-01 (×2): qty 15

## 2017-06-01 MED ORDER — DILTIAZEM HCL-DEXTROSE 100-5 MG/100ML-% IV SOLN (PREMIX)
5.0000 mg/h | INTRAVENOUS | Status: DC
  Administered 2017-06-01: 12.5 mg/h via INTRAVENOUS
  Administered 2017-06-01: 5 mg/h via INTRAVENOUS
  Filled 2017-06-01 (×2): qty 100

## 2017-06-01 NOTE — ED Notes (Signed)
Pt noted as Sat 89%. Dr. Eulis Foster aware. Pt placed on o2 at 2 l/[place].

## 2017-06-01 NOTE — ED Triage Notes (Signed)
Patient complains of ongoing SOB since being seen last week for same. States that she feels as if she is smothering when she lays down. Also complains of dry cough x 1 day and 4 lb weight gain, denies pain. Alert and oriented, HR 124

## 2017-06-01 NOTE — Progress Notes (Signed)
ANTICOAGULATION CONSULT NOTE - Initial Consult  Pharmacy Consult for Heparin Indication: atrial fibrillation  Allergies  Allergen Reactions  . Iodinated Diagnostic Agents Anaphylaxis  . Hibiclens [Chlorhexidine Gluconate]   . Iohexol      Desc: ANGIO EDEMA W/ ANAPHYLAXIS PRIOR... HIVES POST 13 HR PREP...OK/A.C.   . Levaquin [Levofloxacin In D5w]   . Oxycodone-Acetaminophen Nausea And Vomiting  . Propoxyphene N-Acetaminophen Nausea And Vomiting    Patient Measurements: Weight: 183 lb 14.4 oz (83.4 kg) Heparin Dosing Weight: 73.2 kg  Vital Signs: Temp: 97.7 F (36.5 C) (01/14 1041) Temp Source: Oral (01/14 1041) BP: 128/78 (01/14 1445) Pulse Rate: 122 (01/14 1445)  Labs: Recent Labs    06/01/17 1041  HGB 11.5*  HCT 36.0  PLT 381  CREATININE 1.78*   Estimated Creatinine Clearance: 27.7 mL/min (A) (by C-G formula based on SCr of 1.78 mg/dL (H)).  Medical History: Past Medical History:  Diagnosis Date  . Arthritis   . Atrial fibrillation ([place])   . Carotid artery occlusion   . Diabetes mellitus   . Diabetic retinopathy ([place])   . GERD (gastroesophageal reflux disease)   . Hyperlipidemia   . Hypertension   . Macular degeneration    Right eye only  . Peripheral vascular disease ([facility])    hx rt carotid s/p  . Pneumonia    hx  . PONV (postoperative nausea and vomiting)     Medications:  Naproxen PTA 220mg  PRN pain  Assessment: 76yof presented to ED for Afib with RVR. No anticogulant PTA. Elevated Scr at 1.78. Low Hgb at 11.5, Plt WNL. No bleeding noted.  Goal of Therapy:  Heparin level 0.3-0.7 units/ml Monitor platelets by anticoagulation protocol: Yes   Plan:  Give 3500 units bolus x 1 Start heparin infusion at 1100 units/hr Continue to monitor H&H and platelets  8 hour heparin level  Deforest Maiden 06/01/2017,3:38 PM

## 2017-06-01 NOTE — Progress Notes (Signed)
Paged [facility]. Patient converted to SB. Cardizem drip stopped. EKG obtained. New orders for PO Cardizem.

## 2017-06-01 NOTE — ED Notes (Signed)
Pt ambulated independently to bathroom.

## 2017-06-01 NOTE — H&P (Addendum)
History and Physical    TIFINI REEDER AOZ:[id number] DOB: Jan 28, 1941 DOA: 06/01/2017  PCP: Haywood Pao, MD Consultants:  Ellene Route - neurosurg; [place]; Oval Linsey - referral pending; Katy Fitch - ophthalmology; Zadie Rhine - retinaTrula Slade - vascular Patient coming from:  Home - lives with daughter; NOK: Daughter, [phone]  Chief Complaint: SOB  HPI: EMMALIN JAQUESS is a 77 y.o. female with medical history significant of carotid disease; HTN; HLD; DM; and afib not on AC presenting with SOB/orthopnea.   They report that she was here for Afib, seen last Thursday; discharged to see PCP and get cardiology referral.  Over the weekend, she developed intermittent diaphoresis and orthopnea.  She was unable to lie flat last night and had to sit up in a chair.  They called the PCP this AM and he sent them here.  She has not been able to feel the afib.  Last episode of afib was in 2008 when she had carotid stenosis.  No AC since the afib is so remote.  She did have LE edema over the summer but none recently.  SOB is currently present all the time except when sitting up in a chair - although currently in the ER lying down with O2 she is not SOB.  Denies chest pain.  She had a bit of pressure behind her right shoulder blade last Thursday.   Faint cough just this AM.  Temp 99 this AM.   ED Course:  Afib with RVR, ?secondary to pulmonary disease (chronic ILD).  Here last week with similar.  +orthopnea.  Increased BNP but CXR unremarkable.  Lopressor not so helpful, Cardizem helping.  BP stable.  Recommends Cards/Pulm.  Suggests Eliquis.  No afib since 2008.  Review of Systems: As per HPI; otherwise review of systems reviewed and negative.   Ambulatory Status:  Ambulates without assistance  Past Medical History:  Diagnosis Date  . Arthritis   . Atrial fibrillation ([place])   . Carotid artery occlusion   . Diabetes mellitus   . Diabetic retinopathy ([place])   . GERD (gastroesophageal reflux  disease)   . Hyperlipidemia   . Hypertension   . Macular degeneration    Right eye only  . Peripheral vascular disease (HCC)    hx rt carotid s/p  . Pneumonia    hx  . PONV (postoperative nausea and vomiting)     Past Surgical History:  Procedure Laterality Date  . CAROTID ENDARTERECTOMY  68   rt  . CAROTID ENDARTERECTOMY  Jan. 19, 2007   Right  cea  . CHOLECYSTECTOMY  68  . LUMBAR LAMINECTOMY/DECOMPRESSION MICRODISCECTOMY  02/27/2012   Procedure: LUMBAR LAMINECTOMY/DECOMPRESSION MICRODISCECTOMY 2 LEVELS;  Surgeon: Kristeen Miss, MD;  Location: [facility];  Service: Neurosurgery;  Laterality: N/A;  Lumbar two-three, four-five Laminectomy/foraminotomy  . SPINAL CORD DECOMPRESSION  Oct. 2013    Social History   Socioeconomic History  . Marital status: Divorced    Spouse name: Not on file  . Number of children: Not on file  . Years of education: Not on file  . Highest education level: Not on file  Social Needs  . Financial resource strain: Not on file  . Food insecurity - worry: Not on file  . Food insecurity - inability: Not on file  . Transportation needs - medical: Not on file  . Transportation needs - non-medical: Not on file  Occupational History  . Occupation: Critical care nurse  Tobacco Use  . Smoking status: Former Smoker  Packs/day: 0.25    Years: 32.00    Pack years: 8.00    Types: Cigarettes    Last attempt to quit: 02/20/2004    Years since quitting: 13.2  . Smokeless tobacco: Never Used  Substance and Sexual Activity  . Alcohol use: No  . Drug use: No  . Sexual activity: Yes    Birth control/protection: Post-menopausal  Other Topics Concern  . Not on file  Social History Narrative  . Not on file    Allergies  Allergen Reactions  . Iodinated Diagnostic Agents Anaphylaxis  . Hibiclens [Chlorhexidine Gluconate]   . Iohexol      Desc: ANGIO EDEMA W/ ANAPHYLAXIS PRIOR... HIVES POST 13 HR PREP...OK/A.C.   . Levaquin [Levofloxacin In D5w]   .  Oxycodone-Acetaminophen Nausea And Vomiting  . Propoxyphene N-Acetaminophen Nausea And Vomiting    Family History  Problem Relation Age of Onset  . Heart disease Mother        Heart Disease before age 78  . Heart disease Father   . Heart disease Sister        Heart Disease before age 86  . Breast cancer Daughter 74    Prior to Admission medications   Medication Sig Start Date End Date Taking? Authorizing Provider  Acetaminophen (TYLENOL PO) Take 1 tablet by mouth as needed (Pain).   Yes [provider]  aspirin 325 MG tablet Take 325 mg by mouth daily. HOLD DUE TO SURGERY   Yes [provider]  atorvastatin (LIPITOR) 80 MG tablet Take 80 mg by mouth daily.   Yes [provider]  carboxymethylcellulose (REFRESH PLUS) 0.5 % SOLN Place 1 drop into both eyes daily.   Yes [provider]  diphenhydrAMINE (BENADRYL) 25 MG tablet Take 25 mg by mouth every 6 (six) hours as needed for allergies.   Yes [provider]  fenofibrate 160 MG tablet Take 160 mg by mouth daily.   Yes [provider]  hydrochlorothiazide (HYDRODIURIL) 25 MG tablet Take 25 mg by mouth daily.   Yes [provider]  HYDROcodone-acetaminophen (NORCO/VICODIN) 5-325 MG per tablet Take 1-2 tablets by mouth every 4 (four) hours as needed for pain. 02/28/12  Yes Erline Levine, MD  insulin glargine (LANTUS) 100 UNIT/ML injection Inject 34 Units into the skin at bedtime.    Yes [provider]  JANUVIA 100 MG tablet Take 100 mg by mouth daily. 05/20/17  Yes [provider]  lidocaine (LIDODERM) 5 % Place 1 patch onto the skin as needed (Back Pain). Remove & Discard patch within 12 hours or as directed by MD   Yes [provider]  losartan (COZAAR) 50 MG tablet Take 50 mg by mouth daily.   Yes [provider]  meloxicam (MOBIC) 15 MG tablet Take 15 mg by mouth as needed for pain. ON HOLD DUE TO SURGERY   Yes [provider]    Menthol, Topical Analgesic, (BIOFREEZE EX) Apply 1 application topically as needed (Back and Knee pain).   Yes [provider]  metFORMIN (GLUCOPHAGE) 500 MG tablet Take 500 mg by mouth See admin instructions. Patient takes 500mg  in the morning and 1000mg  in the evening   Yes [provider]  metoprolol (LOPRESSOR) 50 MG tablet Take 50 mg by mouth 2 (two) times daily.    Yes [provider]  Multiple Vitamins-Minerals (PRESERVISION AREDS 2 PO) Take 1 tablet by mouth 2 (two) times daily.   Yes [provider]  naproxen sodium (ALEVE)  220 MG tablet Take 220 mg by mouth daily as needed (back pain).   Yes [provider]  omeprazole (PRILOSEC) 20 MG capsule Take 20 mg by mouth daily.   Yes [provider]  diazepam (VALIUM) 5 MG tablet Take 1 tablet (5 mg total) by mouth every 6 (six) hours as needed. Patient not taking: Reported on 05/28/2017 02/28/12   Erline Levine, MD  traMADol (ULTRAM) 50 MG tablet Take 50 mg by mouth daily. For pain- BACK  ( Extended Release )    [provider]    Physical Exam: Vitals:   06/01/17 1400 06/01/17 1415 06/01/17 1430 06/01/17 1445  BP: 131/88 135/85 136/88 128/78  Pulse: (!) 133 (!) 128 (!) 135 (!) 122  Resp: (!) 22 (!) 23 (!) 23 (!) 22  Temp:      TempSrc:      SpO2: 93% 93% 92% 92%  Weight:         General:  Appears calm and comfortable and is NAD Eyes:  PERRL, EOMI, normal lids, iris ENT:  grossly normal hearing, lips & tongue, mmm Neck:  no LAD, masses or thyromegaly; no carotid bruits Cardiovascular:  Irregularly irregular, +tachycardia to 120s, no m/r/g. No LE edema.  Respiratory:   Scant bibasilar crackles extending 1/3 up lung fields.  Normal respiratory effort. Abdomen:  soft, NT, ND, NABS Back:   normal alignment, no CVAT Skin:  no rash or induration seen on limited exam Musculoskeletal:  grossly normal tone BUE/BLE, good ROM, no bony abnormality Lower extremity:  No LE edema.   Limited foot exam with no ulcerations.  2+ distal pulses. Psychiatric:  grossly normal mood and affect, speech fluent and appropriate, AOx3 Neurologic:  CN 2-12 grossly intact, moves all extremities in coordinated fashion, sensation intact    Radiological Exams on Admission: Dg Chest 2 View  Result Date: 06/01/2017 CLINICAL DATA:  Shortness of Breath EXAM: CHEST  2 VIEW COMPARISON:  05/28/2017 FINDINGS: Cardiomegaly. Bibasilar atelectasis. No visible effusions. Aortic atherosclerosis diffusely. 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 IMPRESSION: Cardiomegaly with diffuse mild interstitial prominence, likely chronic interstitial changes. Bibasilar atelectasis. Electronically Signed   By: Rolm Baptise M.D.   On: 06/01/2017 11:16    EKG: Independently reviewed.  Afib with rate 139; no evidence of acute ischemia   Labs on Admission: I have personally reviewed the available labs and imaging studies at the time of the admission.  Pertinent labs:   CO2 20 Glucose 114 BUN 32/Creatinine 1.78/GFR 31; 29/1.63/34 on 1/10 BNP 1532.3; 545.3 on 1/10 Troponin 0.00 WBC 9.9 Hgb 11.5 - stable Plt 381   Assessment/Plan Principal Problem:   Atrial fibrillation with rapid ventricular response (HCC) Active Problems:   Diabetes mellitus type 2 in obese (HCC)   Hyperlipidemia   Essential hypertension   Congestive heart failure (CHF) (HCC)   CKD (chronic kidney disease) stage 3, GFR 30-59 ml/min (HCC)   Afib with RVR -Patient presenting with new-onset/recurrent afib (remote h/o the same).  -Since the afib onset is unknown, will focus on rate control and searching for the underlying cause at this time. -Will admit to SDU for Diltiazem drip as per protocol with plan to transition to PO Diltiazem once heart rate is controlled.   -Troponin negative -Will give ASA 81 mg PO daily.   -Will request Echocardiogram for further evaluation    -Will risk stratify with FLP and HgbA1c; will also order TSH..  -Repeat EKG in AM  -  Will consult cardiology . -Heart rate is well controlled. -CHA2DS2-VASc Score is >2 and so patient will need oral anticoagulation.  -Will start Heparin for now and ask CM to assist with determining the most cost-efficacy [place] option for her.  CHF -Patient presenting with worsening SOB and orthopnea -Elevated BNP -CXR indicates possible ILD but volume overload is more likely in this scenario (acute to subacute onset in the setting of afib with RVR) -With elevated BNP and abnl CXR, new-onset CHF seems most probable as diagnosis -While a primary pulmonary disease is a consideration, will defer pulmonology evaluation at this time in favor of cardiac evaluation/treatment -It is possible that this is systolic or diastolic dysfunction but may simply be inability to compensate due to afib with RVR -Will admit to SDU with telemetry -Will request echocardiogram  -Will continue ASA -Will hold ACEI given renal insufficiency -No beta blocker while on Cardizem drip -CHF order set utilized; may need CHF team consult but will hold until Echo results are available -Will give Lasix 40 mg IV BID x 3 doses -Continue [place] O2 for now -Cardiology consult in AM - [employer] request sent -Repeat EKG in AM  DM -Will check A1c -hold Glucophage, Januvia -Cover with moderate-scale SSI  HTN -Hold ACE and BB for now due to cardizem drip and risk of hypotension -Likely needs to d/c HCTZ due to CKD  HLD -Continue high-dose Lipitor  CKD -Likely needs to d/c NSAIDs - including both Naproxen and Mobic which are on her list of medications -Likely needs to stop HCTZ -resume ACE once appropriate -patient reports that her baseline has been stable, but is unsure what this is; by review of records, likely around 1.6  DVT prophylaxis: Heparin drip Code Status:  Full - confirmed with patient/family Family Communication: Daughter  present throughout evaluation  Disposition Plan:  Home once clinically improved Consults called: Cardiology (via [employer] message); CM; PT/OT  Admission status: Admit - It is my clinical opinion that admission to INPATIENT is reasonable and necessary because of the expectation that this patient will require hospital care that crosses at least 2 midnights to treat this condition based on the medical complexity of the problems presented.  Given the aforementioned information, the predictability of an adverse outcome is felt to be significant.     Karmen Bongo MD [facility]  If note is complete, please contact covering daytime or nighttime physician. [email or web address] Password [email or web address]  06/01/2017, 3:20 PM

## 2017-06-01 NOTE — ED Notes (Signed)
Heart healthy 1500 fluid restriction dinner tray ordered

## 2017-06-01 NOTE — ED Notes (Signed)
Pt c/o itching above iv site. Pt scratches this area and area becomes red. Pt stets this happens for her sometimes. Pt states the itching has resolved. Dr. Eulis Foster aware and states to observe for change. Site is clear and no redness noted in vein above iv except for area where pt was scratching.

## 2017-06-01 NOTE — ED Provider Notes (Signed)
[facility] EMERGENCY DEPARTMENT Provider Note   CSN: [id number] Arrival date & time: 06/01/17  1029     History   Chief Complaint Chief Complaint  Patient presents with  . SOB/CHF    HPI Gloria [name] is a 77 y.o. female.  Here for evaluation of shortness of breath, worse with supine position better with sitting up, present for about 10 days.  Recently evaluated for the same, without definitive diagnosis.  She had been instructed to follow-up with cardiologist as an outpatient.  She had screening for PE, 4 days ago which was negative.  She is taking her prescribed medications.  She denies chest pain, diaphoresis, nausea or vomiting.  She has had an occasional dry cough not productive of sputum.  No clear history of heart failure.  No recent change in bowel or urinary habits.  There are no other known modifying factors.   HPI  Past Medical History:  Diagnosis Date  . Arthritis   . Atrial fibrillation ([place])   . Carotid artery occlusion   . Diabetes mellitus   . Diabetic retinopathy ([place])   . GERD (gastroesophageal reflux disease)   . Hyperlipidemia   . Hypertension   . Macular degeneration    Right eye only  . Peripheral vascular disease ([facility])    hx rt carotid s/p  . Pneumonia    hx  . PONV (postoperative nausea and vomiting)     Patient Active Problem List   Diagnosis Date Noted  . Atrial fibrillation with rapid ventricular response ([place]) 06/01/2017  . Occlusion and stenosis of carotid artery without mention of cerebral infarction 06/24/2012  . DIABETES MELLITUS, TYPE I 01/14/2010  . HYPERLIPIDEMIA 01/14/2010  . HYPERTENSION 01/14/2010  . PARONYCHIA, FINGER 01/14/2010  . DERMATITIS 01/14/2010    Past Surgical History:  Procedure Laterality Date  . CAROTID ENDARTERECTOMY  68   rt  . CAROTID ENDARTERECTOMY  Jan. 19, 2007   Right  cea  . CHOLECYSTECTOMY  68  . LUMBAR LAMINECTOMY/DECOMPRESSION MICRODISCECTOMY  02/27/2012   Procedure:  LUMBAR LAMINECTOMY/DECOMPRESSION MICRODISCECTOMY 2 LEVELS;  Surgeon: Kristeen Miss, MD;  Location: [facility];  Service: Neurosurgery;  Laterality: N/A;  Lumbar two-three, four-five Laminectomy/foraminotomy  . SPINAL CORD DECOMPRESSION  Oct. 2013    OB History    No data available       Home Medications    Prior to Admission medications   Medication Sig Start Date End Date Taking? Authorizing Provider  Acetaminophen (TYLENOL PO) Take 1 tablet by mouth as needed (Pain).   Yes [provider]  aspirin 325 MG tablet Take 325 mg by mouth daily. HOLD DUE TO SURGERY   Yes [provider]  atorvastatin (LIPITOR) 80 MG tablet Take 80 mg by mouth daily.   Yes [provider]  carboxymethylcellulose (REFRESH PLUS) 0.5 % SOLN Place 1 drop into both eyes daily.   Yes [provider]  diphenhydrAMINE (BENADRYL) 25 MG tablet Take 25 mg by mouth every 6 (six) hours as needed for allergies.   Yes [provider]  fenofibrate 160 MG tablet Take 160 mg by mouth daily.   Yes [provider]  hydrochlorothiazide (HYDRODIURIL) 25 MG tablet Take 25 mg by mouth daily.   Yes [provider]  HYDROcodone-acetaminophen (NORCO/VICODIN) 5-325 MG per tablet Take 1-2 tablets by mouth every 4 (four) hours as needed for pain. 02/28/12  Yes Erline Levine, MD  insulin glargine (LANTUS) 100 UNIT/ML injection Inject 34 Units into the skin  at bedtime.    Yes [provider]  JANUVIA 100 MG tablet Take 100 mg by mouth daily. 05/20/17  Yes [provider]  lidocaine (LIDODERM) 5 % Place 1 patch onto the skin as needed (Back Pain). Remove & Discard patch within 12 hours or as directed by MD   Yes [provider]  losartan (COZAAR) 50 MG tablet Take 50 mg by mouth daily.   Yes [provider]  meloxicam (MOBIC) 15 MG tablet Take 15 mg by mouth as needed for pain. ON HOLD DUE TO SURGERY   Yes [provider]  Menthol, Topical  Analgesic, (BIOFREEZE EX) Apply 1 application topically as needed (Back and Knee pain).   Yes [provider]  metFORMIN (GLUCOPHAGE) 500 MG tablet Take 500 mg by mouth See admin instructions. Patient takes 500mg  in the morning and 1000mg  in the evening   Yes [provider]  metoprolol (LOPRESSOR) 50 MG tablet Take 50 mg by mouth 2 (two) times daily.    Yes [provider]  Multiple Vitamins-Minerals (PRESERVISION AREDS 2 PO) Take 1 tablet by mouth 2 (two) times daily.   Yes [provider]  naproxen sodium (ALEVE) 220 MG tablet Take 220 mg by mouth daily as needed (back pain).   Yes [provider]  omeprazole (PRILOSEC) 20 MG capsule Take 20 mg by mouth daily.   Yes [provider]  diazepam (VALIUM) 5 MG tablet Take 1 tablet (5 mg total) by mouth every 6 (six) hours as needed. Patient not taking: Reported on 05/28/2017 02/28/12   Erline Levine, MD  traMADol (ULTRAM) 50 MG tablet Take 50 mg by mouth daily. For pain- BACK  ( Extended Release )    [provider]    Family History Family History  Problem Relation Age of Onset  . Heart disease Mother        Heart Disease before age 31  . Heart disease Father   . Heart disease Sister        Heart Disease before age 9  . Breast cancer Daughter 58    Social History Social History   Tobacco Use  . Smoking status: Former Smoker    Packs/day: 0.25    Years: 32.00    Pack years: 8.00    Types: Cigarettes    Last attempt to quit: 02/20/2004    Years since quitting: 13.2  . Smokeless tobacco: Never Used  Substance Use Topics  . Alcohol use: No  . Drug use: No     Allergies   Iodinated diagnostic agents; Hibiclens [chlorhexidine gluconate]; Iohexol; Levaquin [levofloxacin in d5w]; Oxycodone-acetaminophen; and Propoxyphene n-acetaminophen   Review of Systems Review of Systems  All other systems reviewed and are negative.    Physical Exam Updated Vital Signs BP  131/88   Pulse (!) 133   Temp 97.7 F (36.5 C) (Oral)   Resp (!) 22   Wt 83.4 kg (183 lb 14.4 oz)   SpO2 93%   BMI 31.57 kg/m   Physical Exam  Constitutional: She is oriented to person, place, and time. She appears well-developed. She appears distressed (Uncomfortable).  Elderly, overweight  HENT:  Head: Normocephalic and atraumatic.  Eyes: Conjunctivae and EOM are normal. Pupils are equal, round, and reactive to light.  Neck: Normal range of motion and phonation normal. Neck supple.  Cardiovascular:  Irregular tachycardia  Pulmonary/Chest: Breath sounds normal. She is in respiratory distress (Mild increased work of breathing.). She exhibits no tenderness.  Tachypneic  Abdominal: Soft. She exhibits no distension. There is no tenderness. There is no guarding.  Musculoskeletal: Normal range of motion. She exhibits edema. She exhibits no deformity.  Neurological: She is alert and oriented to person, place, and time. She exhibits normal muscle tone.  Skin: Skin is warm and dry.  Psychiatric: She has a normal mood and affect. Her behavior is normal. Judgment and thought content normal.  Nursing note and vitals reviewed.    ED Treatments / Results  Labs (all labs ordered are listed, but only abnormal results are displayed) Labs Reviewed  BASIC METABOLIC PANEL - Abnormal; Notable for the following components:      Result Value   CO2 20 (*)    Glucose, Bld 114 (*)    BUN 32 (*)    Creatinine, Ser 1.78 (*)    Calcium 8.8 (*)    GFR calc non Af Amer 26 (*)    GFR calc Af Amer 31 (*)    All other components within normal limits  CBC - Abnormal; Notable for the following components:   Hemoglobin 11.5 (*)    All other components within normal limits  BRAIN NATRIURETIC PEPTIDE - Abnormal; Notable for the following components:   B Natriuretic Peptide 1,532.3 (*)    All other components within normal limits  I-STAT TROPONIN, ED    EKG  EKG Interpretation  Date/Time:  Monday  June 01 2017 10:36:45 EST Ventricular Rate:  139 PR Interval:    QRS Duration: 74 QT Interval:  334 QTC Calculation: 508 R Axis:   -2 Text Interpretation:  Atrial fibrillation with rapid ventricular response Low voltage QRS Nonspecific T wave abnormality Abnormal ECG Since last tracing rate faster and  Atrial fibrillation with rapid ventricular response is present Confirmed by Daleen Bo [phone]) on 06/01/2017 10:57:38 AM      This patients CHA2DS2-VASc Score and unadjusted Ischemic Stroke Rate (% per year) is equal to 7.2 % stroke rate/year from a score of 5-2 for age, 1 for female status, 1 for hypertension, 1 for vascular disease  Above score calculated as 1 point each if present [CHF, HTN, DM, Vascular=MI/PAD/Aortic Plaque, Age if 65-74, or Female] Above score calculated as 2 points each if present [Age > 75, or Stroke/TIA/TE]      Radiology Dg Chest 2 View  Result Date: 06/01/2017 CLINICAL DATA:  Shortness of Breath EXAM: CHEST  2 VIEW COMPARISON:  05/28/2017 FINDINGS: Cardiomegaly. Bibasilar atelectasis. No visible effusions. Aortic atherosclerosis diffusely. 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 IMPRESSION: Cardiomegaly with diffuse mild interstitial prominence, likely chronic interstitial changes. Bibasilar atelectasis. Electronically Signed   By: Rolm Baptise M.D.   On: 06/01/2017 11:16    Procedures .Critical Care Performed by: Daleen Bo, MD Authorized by: Daleen Bo, MD   Critical care provider statement:    Critical care time (minutes):  65   Critical care start time:  06/01/2017 12:15 PM   Critical care end time:  06/01/2017 2:18 PM   Critical care time was exclusive of:  Separately billable procedures and treating other patients   Critical care was necessary to treat or prevent imminent or life-threatening deterioration of the following conditions:  Cardiac failure and respiratory failure    Critical care was time spent personally by me on the following activities:  Blood draw for specimens, development of treatment plan with patient or surrogate, discussions with consultants, evaluation of patient's response to treatment, examination of patient, obtaining history from  patient or surrogate, ordering and performing treatments and interventions, ordering and review of laboratory studies, ordering and review of radiographic studies, pulse oximetry, re-evaluation of patient's condition and review of old charts   (including critical care time)  Medications Ordered in ED Medications  diltiazem (CARDIZEM) 1 mg/mL load via infusion 10 mg (10 mg Intravenous Bolus from Bag 06/01/17 1324)    And  diltiazem (CARDIZEM) 100 mg in dextrose 5% 157mL (1 mg/mL) infusion (7.5 mg/hr Intravenous Rate/Dose Change 06/01/17 1407)  sodium chloride 0.9 % bolus 500 mL (0 mLs Intravenous Stopped 06/01/17 1310)  metoprolol tartrate (LOPRESSOR) injection 5 mg (5 mg Intravenous Given 06/01/17 1301)     Initial Impression / Assessment and Plan / ED Course  I have reviewed the triage vital signs and the nursing notes.  Pertinent labs & imaging results that were available during my care of the patient were reviewed by me and considered in my medical decision making (see chart for details).  Clinical Course as of Jun 02 1419  Mon Jun 01, 2017  1120 No acute CHF, or infiltrate DG Chest 2 View [EW]  9024 Lopressor IV given as trial for rate control/conversion. No appreciable HR lowering. Well Tolerated. Cardizem load/drip ordered.  [EW]    Clinical Course User Index [EW] Daleen Bo, MD     Patient Vitals for the past 24 hrs:  BP Temp Temp src Pulse Resp SpO2 Weight  06/01/17 1400 131/88 - - (!) 133 (!) 22 93 % -  06/01/17 1345 117/83 - - (!) 121 (!) 22 92 % -  06/01/17 1330 115/75 - - (!) 109 (!) 25 (!) 89 % -  06/01/17 1315 121/88 - - (!) 132 (!) 25 96 % -  06/01/17 1300 111/70 - - (!) 117 (!) 21 97 % -   06/01/17 1245 (!) 125/91 - - (!) 127 (!) 22 98 % -  06/01/17 1215 (!) 134/123 - - (!) 121 (!) 24 95 % -  06/01/17 1200 (!) 119/103 - - (!) 146 (!) 21 91 % -  06/01/17 1145 (!) 123/102 - - (!) 151 (!) 22 93 % -  06/01/17 1130 121/79 - - (!) 113 (!) 24 93 % -  06/01/17 1122 116/81 - - (!) 134 (!) 25 92 % 83.4 kg (183 lb 14.4 oz)  06/01/17 1041 110/63 97.7 F (36.5 C) Oral (!) 123 16 95 % -    1:51 PM Reevaluation with update and discussion. After initial assessment and treatment, an updated evaluation reveals she is comfortable, denies itching at this time, heart rate improved to 115.  Findings discussed with patient and daughter, all questions answered. Daleen Bo    2:04 PM-Consult complete with Hospitalist. Patient case explained and discussed.  She agrees to admit patient for further evaluation and treatment. Call ended at 2:10 PM  Final Clinical Impressions(s) / ED Diagnoses   Final diagnoses:  Atrial fibrillation with RVR ([place])  Hypoxia    Atrial fibrillation with RVR, and hypoxia.  Suspect chronic baseline pulmonary process.  Uncontrolled atrial fibrillation with elevated chads score, apparently recurrent.  Patient reports last A. fib episode, 2008.  She will require admission for stabilization and treatment with likely pulmonary and possibly cardiology consultation.  Nursing Notes Reviewed/ Care Coordinated Applicable Imaging Reviewed Interpretation of Laboratory Data incorporated into ED treatment  Plan: [place]   ED Discharge Orders    None       Daleen Bo, MD 06/01/17 1425

## 2017-06-02 ENCOUNTER — Encounter (HOSPITAL_COMMUNITY): Payer: Self-pay | Admitting: Physician Assistant

## 2017-06-02 ENCOUNTER — Other Ambulatory Visit: Payer: Self-pay

## 2017-06-02 ENCOUNTER — Inpatient Hospital Stay (HOSPITAL_COMMUNITY): Payer: Medicare Other

## 2017-06-02 DIAGNOSIS — I509 Heart failure, unspecified: Secondary | ICD-10-CM

## 2017-06-02 DIAGNOSIS — N183 Chronic kidney disease, stage 3 (moderate): Secondary | ICD-10-CM

## 2017-06-02 DIAGNOSIS — E1169 Type 2 diabetes mellitus with other specified complication: Secondary | ICD-10-CM

## 2017-06-02 DIAGNOSIS — E669 Obesity, unspecified: Secondary | ICD-10-CM

## 2017-06-02 LAB — BASIC METABOLIC PANEL WITH GFR
Anion gap: 10 (ref 5–15)
BUN: 31 mg/dL — ABNORMAL HIGH (ref 6–20)
CO2: 26 mmol/L (ref 22–32)
Calcium: 8.7 mg/dL — ABNORMAL LOW (ref 8.9–10.3)
Chloride: 105 mmol/L (ref 101–111)
Creatinine, Ser: 1.45 mg/dL — ABNORMAL HIGH (ref 0.44–1.00)
GFR calc Af Amer: 39 mL/min — ABNORMAL LOW
GFR calc non Af Amer: 34 mL/min — ABNORMAL LOW
Glucose, Bld: 73 mg/dL (ref 65–99)
Potassium: 3.3 mmol/L — ABNORMAL LOW (ref 3.5–5.1)
Sodium: 141 mmol/L (ref 135–145)

## 2017-06-02 LAB — LIPID PANEL
CHOLESTEROL: 113 mg/dL (ref 0–200)
HDL: 14 mg/dL — ABNORMAL LOW (ref >40)
LDL CALC: 63 mg/dL (ref 0–99)
TRIGLYCERIDES: 180 mg/dL — AB (ref <150)
Total CHOL/HDL Ratio: 8.1 RATIO
VLDL: 36 mg/dL (ref 0–40)

## 2017-06-02 LAB — CBC WITH DIFFERENTIAL/PLATELET
Basophils Absolute: 0.1 K/uL (ref 0.0–0.1)
Basophils Relative: 1 %
Eosinophils Absolute: 0.2 K/uL (ref 0.0–0.7)
Eosinophils Relative: 3 %
HCT: 33 % — ABNORMAL LOW (ref 36.0–46.0)
Hemoglobin: 10.4 g/dL — ABNORMAL LOW (ref 12.0–15.0)
Lymphocytes Relative: 25 %
Lymphs Abs: 2 K/uL (ref 0.7–4.0)
MCH: 28.3 pg (ref 26.0–34.0)
MCHC: 31.5 g/dL (ref 30.0–36.0)
MCV: 89.7 fL (ref 78.0–100.0)
Monocytes Absolute: 0.7 K/uL (ref 0.1–1.0)
Monocytes Relative: 9 %
Neutro Abs: 5 K/uL (ref 1.7–7.7)
Neutrophils Relative %: 62 %
Platelets: 316 K/uL (ref 150–400)
RBC: 3.68 MIL/uL — ABNORMAL LOW (ref 3.87–5.11)
RDW: 15.1 % (ref 11.5–15.5)
WBC: 7.9 K/uL (ref 4.0–10.5)

## 2017-06-02 LAB — GLUCOSE, CAPILLARY
Glucose-Capillary: 71 mg/dL (ref 65–99)
Glucose-Capillary: 170 mg/dL — ABNORMAL HIGH (ref 65–99)
Glucose-Capillary: 96 mg/dL (ref 65–99)
Glucose-Capillary: 143 mg/dL — ABNORMAL HIGH (ref 65–99)

## 2017-06-02 LAB — ECHOCARDIOGRAM COMPLETE
Height: 65 in
Weight: 2964.8 oz

## 2017-06-02 LAB — HEPARIN LEVEL (UNFRACTIONATED)
Heparin Unfractionated: 0.41 [IU]/mL (ref 0.30–0.70)
Heparin Unfractionated: 0.37 IU/mL (ref 0.30–0.70)

## 2017-06-02 MED ORDER — METOPROLOL TARTRATE 25 MG PO TABS
25.0000 mg | ORAL_TABLET | Freq: Two times a day (BID) | ORAL | Status: DC
Start: 2017-06-02 — End: 2017-06-02
  Administered 2017-06-02: 25 mg via ORAL
  Filled 2017-06-02: qty 1

## 2017-06-02 MED ORDER — SOTALOL HCL 80 MG PO TABS
160.0000 mg | ORAL_TABLET | Freq: Once | ORAL | Status: AC
  Administered 2017-06-02: 160 mg via ORAL
  Filled 2017-06-02: qty 2

## 2017-06-02 MED ORDER — PROSIGHT PO TABS
1.0000 | ORAL_TABLET | Freq: Every day | ORAL | Status: DC
  Administered 2017-06-02 – 2017-06-09 (×8): 1 via ORAL
  Filled 2017-06-02 (×8): qty 1

## 2017-06-02 MED ORDER — FUROSEMIDE 10 MG/ML IJ SOLN: 40.0000 mg | Freq: Two times a day (BID) | INTRAMUSCULAR | Status: DC

## 2017-06-02 MED ORDER — DILTIAZEM HCL 30 MG PO TABS: 30.0000 mg | ORAL_TABLET | Freq: Four times a day (QID) | ORAL | Status: DC | PRN

## 2017-06-02 MED ORDER — POTASSIUM CHLORIDE CRYS ER 20 MEQ PO TBCR
40.0000 meq | EXTENDED_RELEASE_TABLET | Freq: Once | ORAL | Status: AC
  Administered 2017-06-02: 40 meq via ORAL
  Filled 2017-06-02: qty 2

## 2017-06-02 MED ORDER — POTASSIUM CHLORIDE CRYS ER 20 MEQ PO TBCR
40.0000 meq | EXTENDED_RELEASE_TABLET | Freq: Two times a day (BID) | ORAL | Status: DC
  Administered 2017-06-02: 40 meq via ORAL
  Filled 2017-06-02: qty 2

## 2017-06-02 NOTE — Evaluation (Signed)
Physical Therapy Evaluation Patient Details Name: Gloria [name] MRN: [id number] DOB: [birth year] Today's Date: 06/02/2017   History of Present Illness  Pt is a 77 y.o. female who presented with intermittent diaphoresis and orthopnea. She was seen for atrial fibrillation in ED on 1/10 and D/C home to follow up with PCP and cardiology outpatient. On return to hospital found to be in Afib with RVR. PMH significant for carotid disease, HTN, HLD, DM, and afib not on anticoagulation.   Clinical Impression  Pt is at or close to baseline functioning and should be safe at home, but her EHR was still labile and pt still needs follow up with cardiology. There are no further acute PT needs.  Will sign off at this time.     Follow Up Recommendations No PT follow up    Equipment Recommendations  None recommended by PT    Recommendations for Other Services       Precautions / Restrictions Precautions Precaution Comments: watch O2 and RR Restrictions Weight Bearing Restrictions: No      Mobility  Bed Mobility Overal bed mobility: Independent                Transfers Overall transfer level: Modified independent Equipment used: None             General transfer comment: Increased time  Ambulation/Gait Ambulation/Gait assistance: Modified independent (Device/Increase time) Ambulation Distance (Feet): 500 Feet Assistive device: None Gait Pattern/deviations: Step-through pattern   Gait velocity interpretation: at or above normal speed for age/gender General Gait Details: Due to a bad right knee gait was antalgic at times, but pt able to scan, change speed and direction, step over obstacle and negotiate stairs without significant deviation or LOB  Stairs Stairs: Yes Stairs assistance: Modified independent (Device/Increase time) Stair Management: One rail Right;Step to pattern;Alternating pattern;Forwards Number of Stairs: 4 General stair comments: safe with  rail  Wheelchair Mobility    Modified Rankin (Stroke Patients Only)       Balance Overall balance assessment: No apparent balance deficits (not formally assessed)                                           Pertinent Vitals/Pain      Home Living Family/patient expects to be discharged to:: Private residence Living Arrangements: Children Available Help at Discharge: Family;Available PRN/intermittently Type of Home: House Home Access: Level entry     Home Layout: Two level;Able to live on main level with bedroom/bathroom(basement area has all pt needs) Home Equipment: Shower seat - built in;Grab bars - tub/shower;Hand held shower head      Prior Function Level of Independence: Independent         Comments: Works as a Marine scientist. Drives     Hand Dominance        Extremity/Trunk Assessment   Upper Extremity Assessment Upper Extremity Assessment: Overall WFL for tasks assessed    Lower Extremity Assessment Lower Extremity Assessment: Overall WFL for tasks assessed       Communication   Communication: No difficulties  Cognition Arousal/Alertness: Awake/alert Behavior During Therapy: WFL for tasks assessed/performed Overall Cognitive Status: Within Functional Limits for tasks assessed  General Comments General comments (skin integrity, edema, etc.): HR at sink 95bpm, during gait EHR initially bounced between mid 110's and mid 120's, but as she complete end of session EHR rose into 130's to spike of 150.  All non sustained and asymptomatic.    Exercises     Assessment/Plan    PT Assessment Patent does not need any further PT services  PT Problem List         PT Treatment Interventions      PT Goals (Current goals can be found in the Care Plan section)  Acute Rehab PT Goals Patient Stated Goal: to feel better and go home PT Goal Formulation: All assessment and education complete, DC  therapy    Frequency     Barriers to discharge        Co-evaluation               AM-PAC PT "6 Clicks" Daily Activity  Outcome Measure Difficulty turning over in bed (including adjusting bedclothes, sheets and blankets)?: None Difficulty moving from lying on back to sitting on the side of the bed? : None Difficulty sitting down on and standing up from a chair with arms (e.g., wheelchair, bedside commode, etc,.)?: None Help needed moving to and from a bed to chair (including a wheelchair)?: None Help needed walking in hospital room?: None Help needed climbing 3-5 steps with a railing? : None 6 Click Score: 24    End of Session   Activity Tolerance: Patient tolerated treatment well Patient left: in bed;with family/visitor present;with call bell/phone within reach Nurse Communication: Mobility status PT Visit Diagnosis: Unsteadiness on feet (R26.81)    Time: 1450-1509 PT Time Calculation (min) (ACUTE ONLY): 19 min   Charges:   PT Evaluation $PT Eval Low Complexity: 1 Low     PT G Codes:        2017-06-13  Donnella Sham, PT [phone]  (pager)  Tessie Fass Furkan [place] June 13, 2017, 3:30 PM

## 2017-06-02 NOTE — Progress Notes (Signed)
[facility] for Heparin Indication: atrial fibrillation  Allergies  Allergen Reactions  . Iodinated Diagnostic Agents Anaphylaxis  . Hibiclens [Chlorhexidine Gluconate]   . Iohexol      Desc: ANGIO EDEMA W/ ANAPHYLAXIS PRIOR... HIVES POST 13 HR PREP...OK/A.C.   . Levaquin [Levofloxacin In D5w]   . Oxycodone-Acetaminophen Nausea And Vomiting  . Propoxyphene N-Acetaminophen Nausea And Vomiting    Patient Measurements: Height: 5\' 5"  (165.1 cm) Weight: 185 lb 4.8 oz (84.1 kg) IBW/kg (Calculated) : 57 Heparin Dosing Weight: 73.2 kg  Vital Signs: Temp: 98.8 F (37.1 C) (01/15 0808) Temp Source: Oral (01/15 0808) BP: 129/40 (01/15 1144) Pulse Rate: 77 (01/15 1144)  Labs: Recent Labs    06/01/17 1041 06/02/17 0020 06/02/17 0514 06/02/17 1123  HGB 11.5* 10.4*  --   --   HCT 36.0 33.0*  --   --   PLT 381 316  --   --   HEPARINUNFRC  --  0.41  --  0.37  CREATININE 1.78*  --  1.45*  --    Estimated Creatinine Clearance: 34.8 mL/min (A) (by C-G formula based on SCr of 1.45 mg/dL (H)).  Medical History: Past Medical History:  Diagnosis Date  . Arthritis   . Atrial fibrillation ([place])   . Carotid artery occlusion   . Diabetes mellitus   . Diabetic retinopathy ([place])   . GERD (gastroesophageal reflux disease)   . Hyperlipidemia   . Hypertension   . Macular degeneration    Right eye only  . Peripheral vascular disease ([facility])    hx rt carotid s/p  . Pneumonia    hx  . PONV (postoperative nausea and vomiting)     Medications:  Naproxen PTA 220mg  PRN pain  Assessment: 76yof presented to ED for Afib with RVR. No anticogulant PTA.   Heparin level this morning came back therapeutic at 0.37, on 1100 units/hr. Hgb down slightly from 11.5 to 10.4, platelets are stable. No infusion issues. No s/sx of bleeding.   Goal of Therapy:  Heparin level 0.3-0.7 units/ml Monitor platelets by anticoagulation protocol: Yes   Plan:  -Continue  heparin at 1100 units/hr -Daily HL, CBC -Follow up plan for outpatient anticoagulation  Doylene Canard, PharmD Clinical Pharmacist  Pager: [phone] Clinical Phone for 06/02/2017 until 3:30pm: [phone] If after 3:30pm, please call main pharmacy at [phone] 06/02/2017,12:55 PM

## 2017-06-02 NOTE — Consult Note (Signed)
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  History of Present Illness:   [name] has been on ASA 325 mg qd, not otherwise anticoagulated. She would have brief episodes at night 1-2 x year, rx w/ extra Lopressor and sx would resolve in a few minutes.   Last Tuesday, pt began feeling SOB, + orthostatic dizziness, afib sx of palpitations and heart pounding. Dr Osborne Casco said not to take any extra BB because of the dizziness but pt no better overnight and came to ER 01/10. She had a VQ scan>>neg PE, CXR w/ mild bibasilar atx but no CHF. She converted spontaneously to SR. She was d/c'd from the ER feeling better but not completely normal.  She went back into afib the next day, 01/11. She was dizzy and diaphoretic, no CP, had DOE which progressed to SOB at rest. +PND and orthopnea. She was taking the metoprolol but not taking extra BB. She came back on 01/14 and was admitted.  She feels much better today, she was able to ambulate in the halls without a problem. She got Lasix 40 mg IV last pm and this am. She is breathing much better.   She was started on IV Cardizem last pm. She spontaneously converted to SR about 11 pm, her breathing improved but she was not aware of the change. She does not feel the afib when her rate is controlled, had no palpitations after being started on the IV Cardizem. She had about a 2 second pause and some bradycardia after converting to SR, but the Cardizem was d/c'd and her HR improved.    She has not had an afib episode like this since 2006.   No history of CP w/ exertion. DOE that started last week was a sudden and marked change for her. No hx LE edema, orthopnea or PND till the acute illness.  No bleeding issues.    Decreased LE pulses but no claudication sx.    Past Medical History:  Diagnosis Date  . Arthritis   . Atrial fibrillation ([place])   . Carotid artery occlusion   . Diabetes mellitus   . Diabetic retinopathy ([place])   . GERD (gastroesophageal reflux disease)   . Hyperlipidemia   . Hypertension   . Macular degeneration    Right eye only  . Peripheral vascular disease (HCC)    hx rt carotid s/p  . Pneumonia    hx  . PONV (postoperative nausea and vomiting)     Past Surgical History:  Procedure Laterality Date  . CAROTID ENDARTERECTOMY  68   rt  . CAROTID ENDARTERECTOMY  Jan. 19, 2007   Right  cea  . CHOLECYSTECTOMY  68  . LUMBAR LAMINECTOMY/DECOMPRESSION MICRODISCECTOMY  02/27/2012   Procedure: LUMBAR LAMINECTOMY/DECOMPRESSION MICRODISCECTOMY 2 LEVELS;  Surgeon: Kristeen Miss, MD;  Location: [facility];  Service: Neurosurgery;  Laterality: N/A;  Lumbar two-three, four-five Laminectomy/foraminotomy  . SPINAL CORD DECOMPRESSION  Oct. 2013     Prior to Admission medications  Medication Sig Start Date End Date Taking? Authorizing Provider  Acetaminophen (TYLENOL PO) Take 1 tablet by mouth as needed (Pain).   Yes [provider]  aspirin 325 MG tablet Take 325 mg by mouth daily. HOLD DUE TO SURGERY   Yes [provider]  atorvastatin (LIPITOR) 80 MG tablet Take 80 mg by mouth daily.   Yes [provider]  carboxymethylcellulose (REFRESH PLUS) 0.5 % SOLN Place 1 drop into both eyes daily.   Yes [provider]  diphenhydrAMINE (BENADRYL) 25 MG tablet Take 25 mg by mouth every 6 (six) hours as needed for allergies.   Yes [provider]  fenofibrate 160 MG tablet Take 160 mg by mouth daily.   Yes  [provider]  hydrochlorothiazide (HYDRODIURIL) 25 MG tablet Take 25 mg by mouth daily.   Yes [provider]  HYDROcodone-acetaminophen (NORCO/VICODIN) 5-325 MG per tablet Take 1-2 tablets by mouth every 4 (four) hours as needed for pain. 02/28/12  Yes Erline Levine, MD  insulin glargine (LANTUS) 100 UNIT/ML injection Inject 34 Units into the skin at bedtime.    Yes [provider]  JANUVIA 100 MG tablet Take 100 mg by mouth daily. 05/20/17  Yes [provider]  lidocaine (LIDODERM) 5 % Place 1 patch onto the skin as needed (Back Pain). Remove & Discard patch within 12 hours or as directed by MD   Yes [provider]  losartan (COZAAR) 50 MG tablet Take 50 mg by mouth daily.   Yes [provider]  meloxicam (MOBIC) 15 MG tablet Take 15 mg by mouth as needed for pain. ON HOLD DUE TO SURGERY   Yes [provider]  Menthol, Topical Analgesic, (BIOFREEZE EX) Apply 1 application topically as needed (Back and Knee pain).   Yes [provider]  metFORMIN (GLUCOPHAGE) 500 MG tablet Take 500 mg by mouth See admin instructions. Patient takes 500mg  in the morning and 1000mg  in the evening   Yes [provider]  metoprolol (LOPRESSOR) 50 MG tablet Take 50 mg by mouth 2 (two) times daily.    Yes [provider]  Multiple Vitamins-Minerals (PRESERVISION AREDS 2 PO) Take 1 tablet by mouth 2 (two) times daily.   Yes [provider]  naproxen sodium (ALEVE) 220 MG tablet Take 220 mg by mouth daily as needed (back pain).   Yes [provider]  omeprazole (PRILOSEC) 20 MG capsule Take 20 mg by mouth daily.   Yes [provider]  traMADol (ULTRAM) 50 MG tablet Take 50 mg by mouth daily. For pain- BACK  ( Extended Release )    [provider]    Inpatient Medications: Scheduled Meds: . aspirin EC  81 mg Oral Daily  . atorvastatin  80 mg Oral Daily  . diltiazem  30 mg Oral Q6H  .  fenofibrate  160 mg Oral Daily  . furosemide  40 mg Intravenous Q12H  . insulin aspart  0-15 Units Subcutaneous TID WC  . insulin glargine  34 Units Subcutaneous QHS  . multivitamin  1 tablet Oral Daily  . pantoprazole  40 mg Oral Daily  . polyvinyl alcohol  1 drop Both Eyes Daily  . sodium chloride flush  3 mL Intravenous Q12H   Continuous Infusions: . sodium chloride    . heparin 1,100 Units/hr (06/01/17 1635)   PRN Meds: sodium chloride, acetaminophen, HYDROcodone-acetaminophen, ondansetron (ZOFRAN) IV, sodium chloride flush  Allergies:    Allergies  Allergen Reactions  . Iodinated Diagnostic Agents  Anaphylaxis  . Hibiclens [Chlorhexidine Gluconate]   . Iohexol      Desc: ANGIO EDEMA W/ ANAPHYLAXIS PRIOR... HIVES POST 13 HR PREP...OK/A.C.   . Levaquin [Levofloxacin In D5w]   . Oxycodone-Acetaminophen Nausea And Vomiting  . Propoxyphene N-Acetaminophen Nausea And Vomiting    Social History:   Social History   Socioeconomic History  . Marital status: Divorced    Spouse name: Not on file  . Number of children: Not on file  . Years of education: Not on file  . Highest education level: Not on file  Social Needs  . Financial resource strain: Not on file  . Food insecurity - worry: Not on file  . Food insecurity - inability: Not on file  . Transportation needs - medical: Not on file  . Transportation needs - non-medical: Not on file  Occupational History  . Occupation: Critical care nurse  Tobacco Use  . Smoking status: Former Smoker    Packs/day: 0.25    Years: 32.00    Pack years: 8.00    Types: Cigarettes    Last attempt to quit: 02/20/2004    Years since quitting: 13.2  . Smokeless tobacco: Never Used  Substance and Sexual Activity  . Alcohol use: No  . Drug use: No  . Sexual activity: Yes    Birth control/protection: Post-menopausal  Other Topics Concern  . Not on file  Social History Narrative  . Not on file    Family History:   Family History    Problem Relation Age of Onset  . Heart disease Mother        Heart Disease before age 4  . Heart disease Father   . Heart disease Sister        Heart Disease before age 32  . Breast cancer Daughter 9   Family Status:  Family Status  Relation Name Status  . Mother  Deceased at age 53  . Father  Deceased at age 26 39  . Sister  (Not Specified)  . Daughter  (Not Specified)    ROS:  Please see the history of present illness.  All other ROS reviewed and negative.     Physical Exam/Data:   Vitals:   06/01/17 1830 06/01/17 1943 06/01/17 2300 06/02/17 0624  BP: 94/77 132/89 (!) 110/55 (!) 144/62  Pulse: 88 96 64 64  Resp:  17 (!) 23 (!) 22  Temp: 97.6 F (36.4 C) 97.8 F (36.6 C) 97.8 F (36.6 C) 97.7 F (36.5 C)  TempSrc: Oral Oral Oral Oral  SpO2: 92% 95% 91% 90%  Weight: 188 lb 1.6 oz (85.3 kg)   185 lb 4.8 oz (84.1 kg)  Height: 5\' 5"  (1.651 m)       Intake/Output Summary (Last 24 hours) at 06/02/2017 0658 Last data filed at 06/02/2017 2423 Gross per 24 hour  Intake 370.03 ml  Output -  Net 370.03 ml   Filed Weights   06/01/17 1122 06/01/17 1830 06/02/17 0624  Weight: 183 lb 14.4 oz (83.4 kg) 188 lb 1.6 oz (85.3 kg) 185 lb 4.8 oz (84.1 kg)   Body mass index is 30.84 kg/m.  General:  Well nourished, well developed, elderly female, in no acute distress HEENT: normal Lymph: no adenopathy Neck: JVD 9-10 cm Endocrine:  No thryomegaly Vascular: No carotid bruits; upper extremity pulses 2+, DP pulses weak but palpable.   Cardiac:  normal S1, S2; RRR; no murmur  Lungs: decreased BS bases bilaterally, rales noted R, no wheezing, rhonchi  Abd: soft, nontender, no hepatomegaly  Ext: no edema Musculoskeletal:  No deformities, BUE and BLE strength normal and equal Skin: warm and dry  Neuro:  CNs 2-12 intact, no focal abnormalities noted Psych:  Normal affect   EKG:  The EKG was personally reviewed and demonstrates:  01/14 at 10:36, atrial fib, RVR w/ HR 136; 01/14  at 10:30 pm, SR/Sinus brady w/ PAC and PVC Telemetry:  Telemetry was personally reviewed and demonstrates:  SR since last pm, initially bradycardic, but that has improved  Relevant CV Studies:  ECHO: ordered today  DOBUTAMINE ECHO: 02/2012 Impression Exercise Capacity:  Dobutamine study with isometrics after 40 mcg/kg. BP Response:  Hypertensive blood pressure response. Clinical Symptoms:  Palpitations ECG Impression:  No ischemic changes.  Comparison with Prior Nuclear Study: No previous nuclear study performed Overall Impression:  Normal stress nuclear study. LV Ejection Fraction: 66%.  LV Wall Motion:  NL LV Function; NL Wall Motion  Laboratory Data:  Chemistry Recent Labs  Lab 05/28/17 1102 06/01/17 1041 06/02/17 0514  NA 141 141 141  K 3.9 4.5 3.3*  CL 106 106 105  CO2 24 20* 26  GLUCOSE 158* 114* 73  BUN 29* 32* 31*  CREATININE 1.63* 1.78* 1.45*  CALCIUM 8.7* 8.8* 8.7*  GFRNONAA 29* 26* 34*  GFRAA 34* 31* 39*  ANIONGAP 11 15 10     No results found for: ALT, AST, GGT, ALKPHOS, BILITOT Hematology Recent Labs  Lab 05/28/17 1102 06/01/17 1041 06/02/17 0020  WBC 10.3 9.9 7.9  RBC 3.94 3.95 3.68*  HGB 10.6* 11.5* 10.4*  HCT 35.5* 36.0 33.0*  MCV 90.1 91.1 89.7  MCH 26.9 29.1 28.3  MCHC 29.9* 31.9 31.5  RDW 14.7 15.0 15.1  PLT 283 381 316   Cardiac EnzymesNo results for input(s): TROPONINI in the last 168 hours.  Recent Labs  Lab 05/28/17 1117 06/01/17 1052  TROPIPOC 0.00 0.00    BNP Recent Labs  Lab 05/28/17 1258 06/01/17 1041  BNP 545.3* 1,532.3*    DDimer  Recent Labs  Lab 05/28/17 1332  DDIMER 3.09*   TSH:  Lab Results  Component Value Date   TSH 3.128 06/01/2017   Lipids: Lab Results  Component Value Date   CHOL 113 06/02/2017   HDL 14 (L) 06/02/2017   LDLCALC 63 06/02/2017   TRIG 180 (H) 06/02/2017   CHOLHDL 8.1 06/02/2017   HgbA1c: Lab Results  Component Value Date   HGBA1C 7.5 (H) 06/01/2017    Radiology/Studies:   Dg Chest 2 View  Result Date: 06/01/2017 CLINICAL DATA:  Shortness of Breath EXAM: CHEST  2 VIEW COMPARISON:  05/28/2017 FINDINGS: Cardiomegaly. Bibasilar atelectasis. No visible effusions. Aortic atherosclerosis diffusely. 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 IMPRESSION: Cardiomegaly with diffuse mild interstitial prominence, likely chronic interstitial changes. Bibasilar atelectasis. Electronically Signed   By: Rolm Baptise M.D.   On: 06/01/2017 11:16    Assessment and Plan:   Principal Problem: 1.  Atrial fibrillation with rapid ventricular response (HCC) - S/P spontaneous conversion to SR - On low dose Cardizem 30 mg, has not gotten yet due to parameters - will change to prn only - will restart home metoprolol at 25 mg bid and follow HR/BP -  After review with EP would recomm sotalol   Will give 160 x 1  Check QT in AM  Dosing based on that    Patient will need anticoagulation as  - CHA2DS2VASc score = 7 (age x  2, female, PAD, CHF, HTN, DM) - currently on heparin, change to Eliquis    3. Acute Congestive heart failure (CHF) (HCC)  Diastolic PRob due to atrial fib   - Echo LVEF 55%  MIld LAE - continue IV Lasix for now, Reasssess in am   - follow BMET   4.  CKD (chronic kidney disease) stage 3, GFR 30-59 ml/min (HCC) - K+ supp ordered, daily BMET - per IM  5  HL  COntinue statin  6  HTN  Follow BP with med changes        For questions or updates, please contact [facility] Please consult [email or web address] for contact info under Cardiology/STEMI.   Signed, Rosaria Ferries, PA-C  06/02/2017 6:58 AM   Patient seen and examined  I agree with findings as noted above by R Barrett  I have amended her note to reflect my findings as well as echo data  I saw pt last fall for preop eval Had done well from a cardiac standpoint until recently  Found to be in atrial fib last week  In and out   Admitted with  recurrence and feeling poorly  Has been in and out here  Currently on exam in SR Neck JVP normal  LUngs are CTA  Cardiac exam:   RRR  No signif murmurs   Ext without edema  Echo with LVEF 55%  Atria are mildly enlarged  Recommend trial of sotalol to see if she can maintain SR    Would add Eliquis as well Pharmacy to follow. Will check EKG in AM to determine dosing   Dorris Carnes

## 2017-06-02 NOTE — Care Management Note (Addendum)
Case Management Note  Patient Details  Name: Gloria [name] MRN: [id number] Date of Birth: 07/01/1940  Subjective/Objective:  Pt presented for Atrial Fib with RVR- Initiated on IV Heparin. Benefits Check completed for Xarelto vs Eliquis. MD to make choice in regards to medication of choice. Per choice CM will provide 30 day free card. CM will continue to monitor for additional needs.                    Action/Plan: Viona Gilmore JONATHON @  [facility] # [phone]   1. ELIQUIS 5 MG BID  COVER- YES  CO-PAY- $ 45.00  TIER- 3 DRUG  PRIOR APPROVAL- NO   2. XARELTO 20 MG DAILY  COVER- YES  CO-PAY- $ 45.00  TIER- 3 DRUG  PRIOR APPROVAL- NO   NO DEDUCTIBLE  MAIL-ORDER $ 125.00   PREFERRED PHARMACY : [facility]   Expected Discharge Date:                  Expected Discharge Plan:  Home/Self Care  In-House Referral:  NA  Discharge planning Services  CM Consult, Medication Assistance  Post Acute Care Choice:  NA Choice offered to:  NA  DME Arranged:  N/A DME Agency:  NA  HH Arranged:  NA HH Agency:  NA  Status of Service:  Completed, signed off  If discussed at [place] of Stay Meetings, dates discussed:    Additional Comments: 11291-06-04-17 [name], RN,BSN [phone] Sotalol should not be expensive due to cost of Eliquis. No further needs from CM at this time.    1219 05-24-17 [name], RN,BSN [phone] CM did speak with MD and she chose Eliquis. 30 day free card and cost provided to patient. [facility] [place] and medication is available. CM to follow for 02 needs for home. Staff RN in process of weaning. CM will continue to monitor.   [name], RN 06/02/2017, 4:55 PM

## 2017-06-02 NOTE — Progress Notes (Signed)
PROGRESS NOTE    Gloria [name]  PXT:[id number] DOB: 1940-10-04 DOA: 06/01/2017 PCP: Haywood Pao, MD  Brief Narrative: 77 y.o. female with medical history significant of carotid disease; HTN; HLD; DM; and afib not on AC presenting with SOB/orthopnea.   They report that she was here for Afib, seen last Thursday; discharged to see PCP and get cardiology referral.  Over the weekend, she developed intermittent diaphoresis and orthopnea.  She was unable to lie flat last night and had to sit up in a chair.  In the emergency room noted to have atrial fibrillation with RVR and suspected pulmonary edema, started on a Cardizem drip and given IV Lasix, clinically improving transitioned to oral Cardizem now and converted to sinus rhythm last night    Assessment & Plan:   Principal Problem:   Atrial fibrillation with rapid ventricular response ([place]) - Remote history of same, was on Cardizem gtt overnight -Converted to sinus rhythm overnight, currently on oral Cardizem -Follow-up 2-D echocardiogram, TSH normal -Cardiology consulted, restarted metoprolol -Chads 2 score is > 3 will need oral anticoagulation, for now, continue heparin until clear that no further invasive testing is needed pending cards eval   ACute CHF/Pulm edema -improving with diuresis -continue IV lasix today -do not suspect ILD, clinically more likely to be pulm edema, FU CXR in 48hours after adequate diuresis -FU ECHO -monitor I/O, weights  DM -Hba1c is 7.5 -hold Glucophage, Januvia -continue moderate-scale SSI  HTN -Hold ACE and restart BB -stopped HCTZ  HLD -Continue Lipitor  AKI on CKD2 -improving with diuresis and stopping NSAIDs/ACE -baseline around 1.6  Suspected OSA -needs sleep study as OP  DVT prophylaxis: Heparin drip Code Status:  Full - confirmed with patient/family Family Communication: Daughter present throughout evaluation  Disposition Plan:  Home once clinically  improved  Consultants:   Cardiology   Procedures:   Antimicrobials:    Subjective:  -breathing improving, converted to sinus rhythm last night, no chest pain, orthopnea better  Objective: Vitals:   06/02/17 0833 06/02/17 1142 06/02/17 1144 06/02/17 1300  BP:  (!) 141/56 (!) 129/40   Pulse: 77 77 77   Resp: 19     Temp:    98.5 F (36.9 C)  TempSrc:    Oral  SpO2: 91%   94%  Weight:      Height:        Intake/Output Summary (Last 24 hours) at 06/02/2017 1357 Last data filed at 06/02/2017 0627 Gross per 24 hour  Intake 370.03 ml  Output -  Net 370.03 ml   Filed Weights   06/01/17 1122 06/01/17 1830 06/02/17 0624  Weight: 83.4 kg (183 lb 14.4 oz) 85.3 kg (188 lb 1.6 oz) 84.1 kg (185 lb 4.8 oz)    Examination:  General exam: Appears calm and comfortable  Respiratory system: Fine basilar crackles Cardiovascular system: S1 & S2 heard, RRR.  Gastrointestinal system: Abdomen is nondistended, soft and nontender.Normal bowel sounds heard. Central nervous system: Alert and oriented. No focal neurological deficits. Extremities: Trace edema Skin: No rashes, lesions or ulcers Psychiatry: Judgement and insight appear normal. Mood & affect appropriate.     Data Reviewed:   CBC: Recent Labs  Lab 05/28/17 1102 06/01/17 1041 06/02/17 0020  WBC 10.3 9.9 7.9  NEUTROABS  --   --  5.0  HGB 10.6* 11.5* 10.4*  HCT 35.5* 36.0 33.0*  MCV 90.1 91.1 89.7  PLT 283 381 627   Basic Metabolic Panel: Recent Labs  Lab 05/28/17 1102 06/01/17  1041 06/02/17 0514  NA 141 141 141  K 3.9 4.5 3.3*  CL 106 106 105  CO2 24 20* 26  GLUCOSE 158* 114* 73  BUN 29* 32* 31*  CREATININE 1.63* 1.78* 1.45*  CALCIUM 8.7* 8.8* 8.7*   GFR: Estimated Creatinine Clearance: 34.8 mL/min (A) (by C-G formula based on SCr of 1.45 mg/dL (H)). Liver Function Tests: No results for input(s): AST, ALT, ALKPHOS, BILITOT, PROT, ALBUMIN in the last 168 hours. No results for input(s): LIPASE, AMYLASE  in the last 168 hours. No results for input(s): AMMONIA in the last 168 hours. Coagulation Profile: No results for input(s): INR, PROTIME in the last 168 hours. Cardiac Enzymes: No results for input(s): CKTOTAL, CKMB, CKMBINDEX, TROPONINI in the last 168 hours. BNP (last 3 results) No results for input(s): PROBNP in the last 8760 hours. HbA1C: Recent Labs    06/01/17 1903  HGBA1C 7.5*   CBG: Recent Labs  Lab 05/28/17 1839 05/28/17 1957 06/01/17 2211 06/02/17 0733 06/02/17 1109  GLUCAP 66 120* 148* 71 170*   Lipid Profile: Recent Labs    06/02/17 0514  CHOL 113  HDL 14*  LDLCALC 63  TRIG 180*  CHOLHDL 8.1   Thyroid Function Tests: Recent Labs    06/01/17 1903  TSH 3.128   Anemia Panel: No results for input(s): VITAMINB12, FOLATE, FERRITIN, TIBC, IRON, RETICCTPCT in the last 72 hours. Urine analysis: No results found for: COLORURINE, APPEARANCEUR, LABSPEC, PHURINE, GLUCOSEU, HGBUR, BILIRUBINUR, KETONESUR, PROTEINUR, UROBILINOGEN, NITRITE, LEUKOCYTESUR Sepsis Labs: @LABRCNTIP (procalcitonin:4,lacticidven:4)  )No results found for this or any previous visit (from the past 240 hour(s)).       Radiology Studies: Dg Chest 2 View  Result Date: 06/01/2017 CLINICAL DATA:  Shortness of Breath EXAM: CHEST  2 VIEW COMPARISON:  05/28/2017 FINDINGS: Cardiomegaly. Bibasilar atelectasis. No visible effusions. Aortic atherosclerosis diffusely. 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 IMPRESSION: Cardiomegaly with diffuse mild interstitial prominence, likely chronic interstitial changes. Bibasilar atelectasis. Electronically Signed   By: Rolm Baptise M.D.   On: 06/01/2017 11:16        Scheduled Meds: . aspirin EC  81 mg Oral Daily  . atorvastatin  80 mg Oral Daily  . fenofibrate  160 mg Oral Daily  . furosemide  40 mg Intravenous BID  . insulin aspart  0-15 Units Subcutaneous TID WC  . insulin glargine   34 Units Subcutaneous QHS  . metoprolol tartrate  25 mg Oral BID  . multivitamin  1 tablet Oral Daily  . pantoprazole  40 mg Oral Daily  . polyvinyl alcohol  1 drop Both Eyes Daily  . potassium chloride  40 mEq Oral BID  . sodium chloride flush  3 mL Intravenous Q12H   Continuous Infusions: . sodium chloride    . heparin 1,100 Units/hr (06/02/17 0700)     LOS: 1 day    Time spent: 79min    Domenic Polite, MD [facility] Page via [email or web address], password [email or web address] After 7PM please contact night-coverage  06/02/2017, 1:57 PM

## 2017-06-02 NOTE — Progress Notes (Signed)
[place] for Heparin Indication: atrial fibrillation  Allergies  Allergen Reactions  . Iodinated Diagnostic Agents Anaphylaxis  . Hibiclens [Chlorhexidine Gluconate]   . Iohexol      Desc: ANGIO EDEMA W/ ANAPHYLAXIS PRIOR... HIVES POST 13 HR PREP...OK/A.C.   . Levaquin [Levofloxacin In D5w]   . Oxycodone-Acetaminophen Nausea And Vomiting  . Propoxyphene N-Acetaminophen Nausea And Vomiting    Patient Measurements: Height: 5\' 5"  (165.1 cm) Weight: 188 lb 1.6 oz (85.3 kg) IBW/kg (Calculated) : 57 Heparin Dosing Weight: 73.2 kg  Vital Signs: Temp: 97.8 F (36.6 C) (01/14 2300) Temp Source: Oral (01/14 2300) BP: 110/55 (01/14 2300) Pulse Rate: 64 (01/14 2300)  Labs: Recent Labs    06/01/17 1041 06/02/17 0020  HGB 11.5* 10.4*  HCT 36.0 33.0*  PLT 381 316  HEPARINUNFRC  --  0.41  CREATININE 1.78*  --    Estimated Creatinine Clearance: 28.5 mL/min (A) (by C-G formula based on SCr of 1.78 mg/dL (H)).  Medical History: Past Medical History:  Diagnosis Date  . Arthritis   . Atrial fibrillation ([place])   . Carotid artery occlusion   . Diabetes mellitus   . Diabetic retinopathy ([place])   . GERD (gastroesophageal reflux disease)   . Hyperlipidemia   . Hypertension   . Macular degeneration    Right eye only  . Peripheral vascular disease (HCC)    hx rt carotid s/p  . Pneumonia    hx  . PONV (postoperative nausea and vomiting)     Medications:  Naproxen PTA 220mg  PRN pain  Assessment: 76yof presented to ED for Afib with RVR. No anticogulant PTA. Elevated Scr at 1.78. Low Hgb at 11.5, Plt WNL. No bleeding noted.  HL therapeutic.  Goal of Therapy:  Heparin level 0.3-0.7 units/ml Monitor platelets by anticoagulation protocol: Yes   Plan:  -Continue heparin at 1100 units/hr -Daily HL, CBC  Kadan Millstein, Jake Church 06/02/2017,1:53 AM

## 2017-06-02 NOTE — Evaluation (Signed)
Occupational Therapy Evaluation and Discharge Patient Details Name: Gloria [name] MRN: [id number] DOB: February 03, 1941 Today's Date: 06/02/2017    History of Present Illness Pt is a 77 y.o. female who presented with intermittent diaphoresis and orthopnea. She was seen for atrial fibrillation in ED on 1/10 and D/C home to follow up with PCP and cardiology outpatient. On return to hospital found to be in Afib with RVR. PMH significant for carotid disease, HTN, HLD, DM, and afib not on anticoagulation.    Clinical Impression   PTA, pt was independent with ADL and functional mobility. 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 Pt presenting with decreased activity tolerance for ADL this session but is able to complete all ADL with general supervision when standing and ambulating. Pt has excellent family support and will be able to have initial 24 hour assistance from various family members. Educated pt and daughter concerning energy conservation strategies and they verbalize and demonstrate understanding. Encouraged pt to maintain daily activities and functional mobility in hallway with staff throughout hospitalization. No further acute OT needs identified and OT will sign off.     Follow Up Recommendations  No OT follow up;Supervision/Assistance - 24 hour(initial 24 hour assistance)    Equipment Recommendations  None recommended by OT(has needs met)    Recommendations for Other Services       Precautions / Restrictions Precautions Precaution Comments: watch O2 and RR Restrictions Weight Bearing Restrictions: No      Mobility Bed Mobility Overal bed mobility: Independent                Transfers Overall transfer level: Modified independent Equipment used: None             General transfer comment: Increased time    Balance Overall balance assessment: No apparent balance deficits (not formally assessed)                                          ADL either performed or assessed with clinical judgement   ADL Overall ADL's : Needs assistance/impaired Eating/Feeding: Modified independent   Grooming: Supervision/safety;Standing   Upper Body Bathing: Modified independent   Lower Body Bathing: Supervison/ safety;Sit to/from stand   Upper Body Dressing : Modified independent;Sitting   Lower Body Dressing: Supervision/safety;Sit to/from stand   Toilet Transfer: Supervision/safety;Ambulation;Comfort height toilet;Grab bars   Toileting- Clothing Manipulation and Hygiene: Supervision/safety;Sit to/from stand       Functional mobility during ADLs: Supervision/safety General ADL Comments: Overall supervision when standing and ambulating for safety as pt has not been as active as usual while not feeling well and needs to increase activity tolerance. Educated pt and daughter concerning energy conservation strategies and safety and they verbalize understanding.      Vision Patient Visual Report: No change from baseline Vision Assessment?: No apparent visual deficits Additional Comments: Utilizing vision functionally     Perception     Praxis Praxis Praxis tested?: Within functional limits    Pertinent Vitals/Pain Pain Assessment: No/denies pain     Hand Dominance     Extremity/Trunk Assessment Upper Extremity Assessment Upper Extremity Assessment: Overall WFL for tasks assessed   Lower Extremity Assessment Lower Extremity Assessment: Overall WFL for tasks assessed       Communication Communication Communication: No difficulties   Cognition Arousal/Alertness: Awake/alert Behavior During Therapy: WFL for tasks assessed/performed Overall Cognitive Status: Within Functional Limits  for tasks assessed                                     General Comments  Pt on 2L O2 on my arrival and able to maintain saturation >96%. Removed supplemental O2 and pt able to maintain >94%. Difficult to obtain accurate  reading as finger probe moving during session but pt did demonstrate desaturation to 86% during functional mobility and rebounded to 94% with standing rest break and pursed lip breathing. Question accuracy of reading because as therapist applied pressure to finger probe reading rapidly increased. Returned 2L O2 at end of session.     Exercises     Shoulder Instructions      Home Living Family/patient expects to be discharged to:: Private residence Living Arrangements: Children Available Help at Discharge: Family;Available PRN/intermittently Type of Home: House Home Access: Level entry     Home Layout: Two level;Able to live on main level with bedroom/bathroom(basement area has all pt needs)     Bathroom Shower/Tub: Occupational psychologist: Standard     Home Equipment: Shower seat - built in;Grab bars - tub/shower;Hand held shower head          Prior Functioning/Environment Level of Independence: Independent        Comments: Works as a Marine scientist. Drives        OT Problem List: Decreased activity tolerance      OT Treatment/Interventions:      OT Goals(Current goals can be found in the care plan section) Acute Rehab OT Goals Patient Stated Goal: to feel better and go home OT Goal Formulation: With patient/family  OT Frequency:     Barriers to D/C:            Co-evaluation              AM-PAC PT "6 Clicks" Daily Activity     Outcome Measure Help from another person eating meals?: None Help from another person taking care of personal grooming?: A Little Help from another person toileting, which includes using toliet, bedpan, or urinal?: A Little Help from another person bathing (including washing, rinsing, drying)?: A Little Help from another person to put on and taking off regular upper body clothing?: None Help from another person to put on and taking off regular lower body clothing?: A Little 6 Click Score: 20   End of Session Equipment  Utilized During Treatment: Gait belt;Oxygen  Activity Tolerance: Patient tolerated treatment well Patient left: in bed;with call bell/phone within reach;with family/visitor present  OT Visit Diagnosis: Other abnormalities of gait and mobility (R26.89)                Time: 7673-4193 OT Time Calculation (min): 23 min Charges:  OT General Charges $OT Visit: 1 Visit OT Evaluation $OT Eval Moderate Complexity: 1 Mod OT Treatments $Self Care/Home Management : 8-22 mins G-Codes:     Norman Herrlich, MS OTR/L  Pager: [place] A Shaleen Talamantez 06/02/2017, 8:58 AM

## 2017-06-02 NOTE — Progress Notes (Signed)
  Echocardiogram 2D Echocardiogram has been performed.  Gloria [name] 06/02/2017, 11:09 AM

## 2017-06-03 DIAGNOSIS — I4891 Unspecified atrial fibrillation: Secondary | ICD-10-CM

## 2017-06-03 DIAGNOSIS — I5031 Acute diastolic (congestive) heart failure: Secondary | ICD-10-CM

## 2017-06-03 LAB — CBC
HEMATOCRIT: 37 % (ref 36.0–46.0)
Hemoglobin: 11.3 g/dL — ABNORMAL LOW (ref 12.0–15.0)
MCH: 27.5 pg (ref 26.0–34.0)
MCHC: 30.5 g/dL (ref 30.0–36.0)
MCV: 90 fL (ref 78.0–100.0)
Platelets: 333 10*3/uL (ref 150–400)
RBC: 4.11 MIL/uL (ref 3.87–5.11)
RDW: 15.2 % (ref 11.5–15.5)
WBC: 10.6 10*3/uL — ABNORMAL HIGH (ref 4.0–10.5)

## 2017-06-03 LAB — HEPARIN LEVEL (UNFRACTIONATED): HEPARIN UNFRACTIONATED: 0.47 [IU]/mL (ref 0.30–0.70)

## 2017-06-03 LAB — GLUCOSE, CAPILLARY
GLUCOSE-CAPILLARY: 63 mg/dL — AB (ref 65–99)
GLUCOSE-CAPILLARY: 166 mg/dL — AB (ref 65–99)
Glucose-Capillary: 148 mg/dL — ABNORMAL HIGH (ref 65–99)
Glucose-Capillary: 146 mg/dL — ABNORMAL HIGH (ref 65–99)

## 2017-06-03 LAB — BASIC METABOLIC PANEL
Anion gap: 9 (ref 5–15)
BUN: 28 mg/dL — ABNORMAL HIGH (ref 6–20)
CO2: 26 mmol/L (ref 22–32)
Calcium: 8.9 mg/dL (ref 8.9–10.3)
Chloride: 106 mmol/L (ref 101–111)
Creatinine, Ser: 1.46 mg/dL — ABNORMAL HIGH (ref 0.44–1.00)
GFR calc non Af Amer: 33 mL/min — ABNORMAL LOW (ref >60)
GFR, EST AFRICAN AMERICAN: 39 mL/min — AB (ref >60)
Glucose, Bld: 66 mg/dL (ref 65–99)
POTASSIUM: 4.4 mmol/L (ref 3.5–5.1)
SODIUM: 141 mmol/L (ref 135–145)

## 2017-06-03 MED ORDER — DILTIAZEM HCL-DEXTROSE 100-5 MG/100ML-% IV SOLN (PREMIX)
5.0000 mg/h | INTRAVENOUS | Status: DC
  Administered 2017-06-03: 5 mg/h via INTRAVENOUS
  Administered 2017-06-03: 15 mg/h via INTRAVENOUS
  Filled 2017-06-03 (×2): qty 100

## 2017-06-03 MED ORDER — DIPHENHYDRAMINE HCL 25 MG PO CAPS
25.0000 mg | ORAL_CAPSULE | Freq: Three times a day (TID) | ORAL | Status: DC | PRN
  Administered 2017-06-03 – 2017-06-08 (×10): 25 mg via ORAL
  Filled 2017-06-03 (×11): qty 1

## 2017-06-03 MED ORDER — DILTIAZEM LOAD VIA INFUSION
10.0000 mg | Freq: Once | INTRAVENOUS | Status: AC
  Administered 2017-06-03: 10 mg via INTRAVENOUS
  Filled 2017-06-03: qty 10

## 2017-06-03 MED ORDER — SOTALOL HCL 80 MG PO TABS
80.0000 mg | ORAL_TABLET | Freq: Two times a day (BID) | ORAL | Status: DC
  Administered 2017-06-03 (×2): 80 mg via ORAL
  Filled 2017-06-03 (×3): qty 1

## 2017-06-03 MED ORDER — METOPROLOL TARTRATE 5 MG/5ML IV SOLN
5.0000 mg | Freq: Once | INTRAVENOUS | Status: AC
  Administered 2017-06-03: 5 mg via INTRAVENOUS
  Filled 2017-06-03: qty 5

## 2017-06-03 MED ORDER — INSULIN GLARGINE 100 UNIT/ML ~~LOC~~ SOLN
25.0000 [IU] | Freq: Every day | SUBCUTANEOUS | Status: DC
  Administered 2017-06-03 – 2017-06-06 (×4): 25 [IU] via SUBCUTANEOUS
  Filled 2017-06-03 (×5): qty 0.25

## 2017-06-03 MED ORDER — APIXABAN 5 MG PO TABS
5.0000 mg | ORAL_TABLET | Freq: Two times a day (BID) | ORAL | Status: DC
  Administered 2017-06-03 – 2017-06-09 (×13): 5 mg via ORAL
  Filled 2017-06-03 (×13): qty 1

## 2017-06-03 NOTE — Discharge Instructions (Addendum)
You have an appointment set up with the [facility].  Multiple studies have shown that being followed by a dedicated atrial fibrillation clinic in addition to the standard care you receive from your other physicians improves health. We believe that enrollment in the atrial fibrillation clinic will allow [place] to better care for you.   The phone number to the [facility] is [phone]. The clinic is staffed Monday through Friday from 8:30am to 5pm.  Parking Directions: The clinic is located in the Heart and Vascular Building connected to [facility] 1)From [address] turn on to [address] and go to the 3rd entrance  (Heart and Vascular entrance) on the right. 2)Look to the right for Heart &Vascular Parking Garage. 3)A code for the entrance is required please call the clinic to receive this.   4)Take the elevators to the 1st floor. Registration is in the room with the glass walls at the end of the hallway.  If you have any trouble parking or locating the clinic, please dont hesitate to call [phone].  Information on my medicine - ELIQUIS (apixaban)  This medication education was reviewed with me or my healthcare representative as part of my discharge preparation.  The pharmacist that spoke with me during my hospital stay was:  Arty Baumgartner, [facility]  Why was Eliquis prescribed for you? Eliquis was prescribed for you to reduce the risk of a blood clot forming that can cause a stroke if you have a medical condition called atrial fibrillation (a type of irregular heartbeat).  What do You need to know about Eliquis ? Take your Eliquis TWICE DAILY - one tablet in the morning and one tablet in the evening with or without food. If you have difficulty swallowing the tablet whole please discuss with your pharmacist how to take the medication safely.  Take Eliquis exactly as prescribed by your doctor and DO NOT stop taking Eliquis without  talking to the doctor who prescribed the medication.  Stopping may increase your risk of developing a stroke.  Refill your prescription before you run out.  After discharge, you should have regular check-up appointments with your healthcare provider that is prescribing your Eliquis.  In the future your dose may need to be changed if your kidney function or weight changes by a significant amount or as you get older.  What do you do if you miss a dose? If you miss a dose, take it as soon as you remember on the same day and resume taking twice daily.  Do not take more than one dose of ELIQUIS at the same time to make up a missed dose.  Important Safety Information A possible side effect of Eliquis is bleeding. You should call your healthcare provider right away if you experience any of the following: ? Bleeding from an injury or your nose that does not stop. ? Unusual colored urine (red or dark brown) or unusual colored stools (red or black). ? Unusual bruising for unknown reasons. ? A serious fall or if you hit your head (even if there is no bleeding).  Some medicines may interact with Eliquis and might increase your risk of bleeding or clotting while on Eliquis. To help avoid this, consult your healthcare provider or pharmacist prior to using any new prescription or non-prescription medications, including herbals, vitamins, non-steroidal anti-inflammatory drugs (NSAIDs) and supplements.  This website has more information on Eliquis (apixaban): http://www.eliquis.com/eliquis/home

## 2017-06-03 NOTE — Progress Notes (Signed)
Called by nurse that patient reverted back to atrial fib RVR HR 130s-140s. Started on sotalol this admission. Had recurrent AF this AM treated with additional IV metoprolol. Dr. Alan Ripper notes indicate that diltiazem drip may need to be re-tried (previously converted to NSR on this in ER). EF wnl. Blood pressure stable. Evaluated patient who reports mild dyspnea c/w prior afib symptoms but no CP, no acute distress, BP stable, pulse ox 97% on [place]. Since the IV metoprolol earlier did not hold sinus, will rx diltiazem 10mg  bolus then drip. Asked nurse to keep [place] informed if symptoms progress or HR does not improve. May need EP eval in AM. Melina Copa PA-C

## 2017-06-03 NOTE — Progress Notes (Signed)
PROGRESS NOTE    Gloria [name]  MHD:[id number] DOB: [birth year] DOA: 06/01/2017 PCP: Haywood Pao, MD  Brief Narrative: 77 y.o. female with medical history significant of carotid disease; HTN; HLD; DM; and afib not on AC presenting with SOB/orthopnea.   They report that she was here for Afib, seen last Thursday; discharged to see PCP and get cardiology referral.  Over the weekend, she developed intermittent diaphoresis and orthopnea.  She was unable to lie flat last night and had to sit up in a chair.  In the emergency room noted to have atrial fibrillation with RVR and suspected pulmonary edema, started on a Cardizem drip and given IV Lasix, clinically improving transitioned to oral Cardizem now and converted to sinus rhythm last night. 1/15 pm started Sotalol per Cards improved and this morning back in AFib with RVR  Assessment & Plan:   Principal Problem:   Atrial fibrillation with rapid ventricular response (HCC) - Remote history of same, was on Cardizem gtt on admission now off -Converted to sinus rhythm overnight, -started Sotalol per Cards, this am back in Afib with RVR -ECHO with normal EF and wall motion, grade 2DD, TSH normal -Cardiology following -Chads 2 score is > 3, started ELiquis today -monitor HR   ACute CHF/Pulm edema -improved with diuresis, appears euvolemic now -ECHO with normal EF and grade 2 DD -may need low dose oral lasix at discharge -urine output not recorded, weight improving  DM -Hba1c is 7.5 -hold Glucophage, Januvia -continue moderate-scale SSI  HTN -Hold ACE and stopped metoprolol and started Sotalol for afib/RVR -stopped HCTZ  HLD -Continue Lipitor  AKI on CKD2 -improving with diuresis and stopping NSAIDs/ACE -baseline around 1.6  Suspected OSA -needs sleep study as OP  DVT prophylaxis: Eliquis Code Status:  Full Family Communication: Daughter at bedside Disposition Plan:  Home once clinically improved  Consultants:     Cardiology   Procedures:   Antimicrobials:    Subjective: -was doing better last evening in NSR, this am back in AFib with RVR and reports feeling poorly and short of breath  Objective: Vitals:   06/03/17 0550 06/03/17 0749 06/03/17 0851 06/03/17 1131  BP: (!) 143/51 124/66 125/69 (!) 86/74  Pulse: 63 (!) 122 (!) 126 69  Resp: 17 17  18   Temp: 98 F (36.7 C) 97.7 F (36.5 C)  98.1 F (36.7 C)  TempSrc: Oral Oral  Oral  SpO2: 96% 94% 96% 99%  Weight: 82.7 kg (182 lb 4.8 oz)     Height:        Intake/Output Summary (Last 24 hours) at 06/03/2017 1310 Last data filed at 06/03/2017 0900 Gross per 24 hour  Intake 708.98 ml  Output -  Net 708.98 ml   Filed Weights   06/01/17 1830 06/02/17 0624 06/03/17 0550  Weight: 85.3 kg (188 lb 1.6 oz) 84.1 kg (185 lb 4.8 oz) 82.7 kg (182 lb 4.8 oz)    Examination:  Gen: Awake, Alert, Oriented X 3, no distress HEENT: no JVD Lungs: clear bilaterally CVS: S1S2/Irregulaly irregular Abd: soft, Non tender, non distended, BS present Extremities: trace edema Skin: no new rashes Psychiatry: Judgement and insight appear normal. Mood & affect appropriate.     Data Reviewed:   CBC: Recent Labs  Lab 05/28/17 1102 06/01/17 1041 06/02/17 0020 06/03/17 0600  WBC 10.3 9.9 7.9 10.6*  NEUTROABS  --   --  5.0  --   HGB 10.6* 11.5* 10.4* 11.3*  HCT 35.5* 36.0 33.0* 37.0  MCV  90.1 91.1 89.7 90.0  PLT 283 381 316 103   Basic Metabolic Panel: Recent Labs  Lab 05/28/17 1102 06/01/17 1041 06/02/17 0514 06/03/17 0600  NA 141 141 141 141  K 3.9 4.5 3.3* 4.4  CL 106 106 105 106  CO2 24 20* 26 26  GLUCOSE 158* 114* 73 66  BUN 29* 32* 31* 28*  CREATININE 1.63* 1.78* 1.45* 1.46*  CALCIUM 8.7* 8.8* 8.7* 8.9   GFR: Estimated Creatinine Clearance: 34.3 mL/min (A) (by C-G formula based on SCr of 1.46 mg/dL (H)). Liver Function Tests: No results for input(s): AST, ALT, ALKPHOS, BILITOT, PROT, ALBUMIN in the last 168 hours. No  results for input(s): LIPASE, AMYLASE in the last 168 hours. No results for input(s): AMMONIA in the last 168 hours. Coagulation Profile: No results for input(s): INR, PROTIME in the last 168 hours. Cardiac Enzymes: No results for input(s): CKTOTAL, CKMB, CKMBINDEX, TROPONINI in the last 168 hours. BNP (last 3 results) No results for input(s): PROBNP in the last 8760 hours. HbA1C: Recent Labs    06/01/17 1903  HGBA1C 7.5*   CBG: Recent Labs  Lab 06/02/17 1109 06/02/17 1605 06/02/17 2122 06/03/17 0746 06/03/17 1116  GLUCAP 170* 96 143* 63* 166*   Lipid Profile: Recent Labs    06/02/17 0514  CHOL 113  HDL 14*  LDLCALC 63  TRIG 180*  CHOLHDL 8.1   Thyroid Function Tests: Recent Labs    06/01/17 1903  TSH 3.128   Anemia Panel: No results for input(s): VITAMINB12, FOLATE, FERRITIN, TIBC, IRON, RETICCTPCT in the last 72 hours. Urine analysis: No results found for: COLORURINE, APPEARANCEUR, LABSPEC, PHURINE, GLUCOSEU, HGBUR, BILIRUBINUR, KETONESUR, PROTEINUR, UROBILINOGEN, NITRITE, LEUKOCYTESUR Sepsis Labs: @LABRCNTIP (procalcitonin:4,lacticidven:4)  )No results found for this or any previous visit (from the past 240 hour(s)).       Radiology Studies: No results found.      Scheduled Meds: . apixaban  5 mg Oral BID  . aspirin EC  81 mg Oral Daily  . atorvastatin  80 mg Oral Daily  . fenofibrate  160 mg Oral Daily  . insulin aspart  0-15 Units Subcutaneous TID WC  . insulin glargine  25 Units Subcutaneous QHS  . multivitamin  1 tablet Oral Daily  . pantoprazole  40 mg Oral Daily  . polyvinyl alcohol  1 drop Both Eyes Daily  . sodium chloride flush  3 mL Intravenous Q12H  . sotalol  80 mg Oral Q12H   Continuous Infusions: . sodium chloride       LOS: 2 days    Time spent: 7min    Domenic Polite, MD [facility] Page via [email or web address], password [email or web address] After 7PM please contact night-coverage  06/03/2017, 1:10 PM

## 2017-06-03 NOTE — Progress Notes (Signed)
ANTICOAGULATION CONSULT NOTE - Follow Up Consult  Pharmacy Consult for Heparin -> Eliquis Indication: atrial fibrillation  Allergies  Allergen Reactions  . Iodinated Diagnostic Agents Anaphylaxis  . Hibiclens [Chlorhexidine Gluconate]   . Iohexol      Desc: ANGIO EDEMA W/ ANAPHYLAXIS PRIOR... HIVES POST 13 HR PREP...OK/A.C.   . Levaquin [Levofloxacin In D5w]   . Oxycodone-Acetaminophen Nausea And Vomiting  . Propoxyphene N-Acetaminophen Nausea And Vomiting    Patient Measurements: Height: 5\' 5"  (165.1 cm) Weight: 182 lb 4.8 oz (82.7 kg) IBW/kg (Calculated) : 57 Heparin Dosing Weight: 73 kg  Vital Signs: Temp: 97.7 F (36.5 C) (01/16 0749) Temp Source: Oral (01/16 0749) BP: 125/69 (01/16 0851) Pulse Rate: 126 (01/16 0851)  Labs: Recent Labs    06/01/17 1041 06/02/17 0020 06/02/17 0514 06/02/17 1123 06/03/17 0600  HGB 11.5* 10.4*  --   --  11.3*  HCT 36.0 33.0*  --   --  37.0  PLT 381 316  --   --  333  HEPARINUNFRC  --  0.41  --  0.37 0.47  CREATININE 1.78*  --  1.45*  --  1.46*    Estimated Creatinine Clearance: 34.3 mL/min (A) (by C-G formula based on SCr of 1.46 mg/dL (H)).  Assessment:  77 yr old female with atrial fibrillation.    Heparin level is therapeutic (0.47) on 1100 units/hr.  To transition to Eliquis.     Goal of Therapy:  Heparin level 0.3-0.7 units/ml appropriate Eliquis dose for indication Monitor platelets by anticoagulation protocol: Yes   Plan:   Eliquis 5 mg PO BID.  Stop IV heparin when giving first Eliquis dose.    Arty Baumgartner, [place] Pager: [phone] 06/03/2017,10:02 AM

## 2017-06-03 NOTE — Progress Notes (Signed)
Progress Note  Patient Name: Gloria [name] Date of Encounter: 06/03/2017  Primary Cardiologist:Ross  Subjective   SOB with atrial fib  No CP    Inpatient Medications    Scheduled Meds: . aspirin EC  81 mg Oral Daily  . atorvastatin  80 mg Oral Daily  . fenofibrate  160 mg Oral Daily  . insulin aspart  0-15 Units Subcutaneous TID WC  . insulin glargine  34 Units Subcutaneous QHS  . multivitamin  1 tablet Oral Daily  . pantoprazole  40 mg Oral Daily  . polyvinyl alcohol  1 drop Both Eyes Daily  . sodium chloride flush  3 mL Intravenous Q12H   Continuous Infusions: . sodium chloride    . heparin 1,100 Units/hr (06/02/17 1749)   PRN Meds: sodium chloride, acetaminophen, diltiazem, diphenhydrAMINE, HYDROcodone-acetaminophen, ondansetron (ZOFRAN) IV, sodium chloride flush   Vital Signs    Vitals:   06/02/17 2027 06/03/17 0015 06/03/17 0550 06/03/17 0749  BP: (!) 117/44 124/71 (!) 143/51 124/66  Pulse: 73 64 63 (!) 122  Resp: 17 19 17 17   Temp: 98.7 F (37.1 C) 97.6 F (36.4 C) 98 F (36.7 C) 97.7 F (36.5 C)  TempSrc: Oral Oral Oral Oral  SpO2: 98% 94% 96% 94%  Weight:   182 lb 4.8 oz (82.7 kg)   Height:        Intake/Output Summary (Last 24 hours) at 06/03/2017 0836 Last data filed at 06/03/2017 0349 Gross per 24 hour  Intake 468.98 ml  Output -  Net 468.98 ml   Filed Weights   06/01/17 1830 06/02/17 0624 06/03/17 0550  Weight: 188 lb 1.6 oz (85.3 kg) 185 lb 4.8 oz (84.1 kg) 182 lb 4.8 oz (82.7 kg)    Telemetry    SR  Then at around 7 AM Atrial fib RVR 130s to 140 - Personally Reviewed  ECG    5:53  SB 59  QTc OK   - Personally Reviewed  Physical Exam   GEN: MIldly SOB   Neck: Neck full   Cardiac: RRR, no murmurs, rubs, or gallops.  Respiratory: Clear to auscultation bilaterally. GI: Soft, nontender, non-distended  MS: No edema; No deformity. Neuro:  Nonfocal  Psych: Normal affect   Labs    Chemistry Recent Labs  Lab 06/01/17 1041  06/02/17 0514 06/03/17 0600  NA 141 141 141  K 4.5 3.3* 4.4  CL 106 105 106  CO2 20* 26 26  GLUCOSE 114* 73 66  BUN 32* 31* 28*  CREATININE 1.78* 1.45* 1.46*  CALCIUM 8.8* 8.7* 8.9  GFRNONAA 26* 34* 33*  GFRAA 31* 39* 39*  ANIONGAP 15 10 9      Hematology Recent Labs  Lab 06/01/17 1041 06/02/17 0020 06/03/17 0600  WBC 9.9 7.9 10.6*  RBC 3.95 3.68* 4.11  HGB 11.5* 10.4* 11.3*  HCT 36.0 33.0* 37.0  MCV 91.1 89.7 90.0  MCH 29.1 28.3 27.5  MCHC 31.9 31.5 30.5  RDW 15.0 15.1 15.2  PLT 381 316 333    Cardiac EnzymesNo results for input(s): TROPONINI in the last 168 hours.  Recent Labs  Lab 05/28/17 1117 06/01/17 1052  TROPIPOC 0.00 0.00     BNP Recent Labs  Lab 05/28/17 1258 06/01/17 1041  BNP 545.3* 1,532.3*     DDimer  Recent Labs  Lab 05/28/17 1332  DDIMER 3.09*     Radiology    Dg Chest 2 View  Result Date: 06/01/2017 CLINICAL DATA:  Shortness of Breath EXAM: CHEST  2 VIEW COMPARISON:  05/28/2017 FINDINGS: Cardiomegaly. Bibasilar atelectasis. No visible effusions. Aortic atherosclerosis diffusely. 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 IMPRESSION: Cardiomegaly with diffuse mild interstitial prominence, likely chronic interstitial changes. Bibasilar atelectasis. Electronically Signed   By: Rolm Baptise M.D.   On: 06/01/2017 11:16    Cardiac Studies     Patient Profile     77 y.o. female admitted with symptomatic PAF    Assessment & Plan    1  Atrial fib   Pt reverted to afib with RVR this AM  Will continue sotalol at 80 bid and also give 5 mg IV lopressor x 1   If does not convert may need to add dilt IV temporarily   Follow EKG   COntninue eleiquis   Acute diastolic CHF  Prob related to atrial fib   Volume not bad now  WIll follow and dose lasix as needed    3  CKD  Follow Cr  BUN / CR 28/1.46  4  HL  On statin  5  HTN  Follow   Dorris Carnes   For questions or updates,  please contact [facility] Please consult [email or web address] for contact info under Cardiology/STEMI.      Signed, Dorris Carnes, MD  06/03/2017, 8:36 AM

## 2017-06-04 DIAGNOSIS — I10 Essential (primary) hypertension: Secondary | ICD-10-CM

## 2017-06-04 LAB — BASIC METABOLIC PANEL
ANION GAP: 10 (ref 5–15)
BUN: 32 mg/dL — ABNORMAL HIGH (ref 6–20)
CHLORIDE: 105 mmol/L (ref 101–111)
CO2: 24 mmol/L (ref 22–32)
Calcium: 8.8 mg/dL — ABNORMAL LOW (ref 8.9–10.3)
Creatinine, Ser: 2.01 mg/dL — ABNORMAL HIGH (ref 0.44–1.00)
GFR calc non Af Amer: 23 mL/min — ABNORMAL LOW (ref >60)
GFR, EST AFRICAN AMERICAN: 26 mL/min — AB (ref >60)
Glucose, Bld: 141 mg/dL — ABNORMAL HIGH (ref 65–99)
Potassium: 5 mmol/L (ref 3.5–5.1)
Sodium: 139 mmol/L (ref 135–145)

## 2017-06-04 LAB — GLUCOSE, CAPILLARY
GLUCOSE-CAPILLARY: 88 mg/dL (ref 65–99)
GLUCOSE-CAPILLARY: 117 mg/dL — AB (ref 65–99)
Glucose-Capillary: 182 mg/dL — ABNORMAL HIGH (ref 65–99)
Glucose-Capillary: 138 mg/dL — ABNORMAL HIGH (ref 65–99)
Glucose-Capillary: 168 mg/dL — ABNORMAL HIGH (ref 65–99)

## 2017-06-04 MED ORDER — METOPROLOL SUCCINATE ER 25 MG PO TB24: 25.0000 mg | ORAL_TABLET | Freq: Every day | ORAL | Status: DC

## 2017-06-04 MED ORDER — FLECAINIDE ACETATE 50 MG PO TABS
50.0000 mg | ORAL_TABLET | Freq: Two times a day (BID) | ORAL | Status: DC
  Administered 2017-06-04: 50 mg via ORAL
  Filled 2017-06-04 (×2): qty 1

## 2017-06-04 NOTE — Progress Notes (Addendum)
PROGRESS NOTE    Gloria [name]  PYK:[id number] DOB: Oct 06, 1940 DOA: 06/01/2017 PCP: Haywood Pao, MD    Brief Narrative: Patient is a 77 year old Caucasian female with past medical history significant of carotid disease; HTN; HLD; DM; and afib not on AC presenting with SOB/orthopnea. Patient was recently seen in ER for Atrial fibrillation but preferred to be discharged back home to follow up with her PCP and get cardiology referral.  Over the weekend, patient developed intermittent diaphoresis and orthopnea.  She was unable to lie flat last night and had to sit up in a chair. In the emergency room, patient was noted to have atrial fibrillation with RVR and suspected pulmonary edema. Patient was started on a Cardizem drip and given IV Lasix, clinically improving transitioned to oral Cardizem now and converted to sinus rhythm last night. 1/15 pm started Sotalol per Cards improved and this morning back in AFib with RVR   06/04/17 - Patient seen alongside patient's daughter. Reviewed available documentation. Rate controlling medication are on hold due to hypotension, significant bradycardia and worsening renal function. Cardiology plans to consult EP team to advise further care. No SOB, no chest pain, no palpitations.  Assessment & Plan:   Principal Problem:   Atrial fibrillation with rapid ventricular response (HCC) - Remote history of same, was on Cardizem gtt on admission now off -Converted to sinus rhythm overnight, -started Sotalol per Cards, this am back in Afib with RVR -ECHO with normal EF and wall motion, grade 2DD, TSH normal -Cardiology following -Chads 2 score is > 3, started ELiquis today -monitor HR - Medications are currently on hold - Plan as per Cardiology is to consult EP Team.   ACute CHF/Pulm edema -improved with diuresis, appears euvolemic now -ECHO with normal EF and grade 2 DD -may need low dose oral lasix at discharge -urine output not recorded, weight  improving - Patient is currently asymptomatic  DM -Hba1c is 7.5 -hold Glucophage, Januvia -continue moderate-scale SSI  HTN -Hold ACE and stopped metoprolol and started Sotalol for afib/RVR -stopped HCTZ  HLD -Continue Lipitor  AKI on CKD2 -improving with diuresis and stopping NSAIDs/ACE -baseline around 1.6 - SCr is 2.01 today, likely secondary to combined hypotension and diuretics. - Monitor renal function and electrolytes closely. Potassium is 5.  Suspected OSA -needs sleep study as OP  DVT prophylaxis: Eliquis Code Status:  Full Family Communication: Daughter at bedside Disposition Plan:  Home once clinically improved  Consultants:   Cardiology   Procedures:   Antimicrobials:    Subjective: - Bradycardia and Hypotension noted over the last 24 hours. - Patient is not back to baseline - Currently, no SOB or chest pain - Worsening renal function noted  Objective: Vitals:   06/04/17 0525 06/04/17 0532 06/04/17 0751 06/04/17 1158  BP: (!) 105/35  (!) 104/58 (!) 127/42  Pulse: (!) 46  (!) 44 (!) 54  Resp: 19  18 18   Temp: (!) 97.2 F (36.2 C) 97.6 F (36.4 C) 98 F (36.7 C) 99.3 F (37.4 C)  TempSrc: Oral Oral Axillary Axillary  SpO2: 93%  91% 96%  Weight:      Height:        Intake/Output Summary (Last 24 hours) at 06/04/2017 1213 Last data filed at 06/04/2017 1101 Gross per 24 hour  Intake 574.75 ml  Output -  Net 574.75 ml   Filed Weights   06/01/17 1830 06/02/17 0624 06/03/17 0550  Weight: 85.3 kg (188 lb 1.6 oz) 84.1 kg (185  lb 4.8 oz) 82.7 kg (182 lb 4.8 oz)    Examination:  Gen: Awake, Alert, Oriented X 3, no distress HEENT: no JVD Lungs: clear bilaterally CVS: S1S2, regular Abd: Obese, soft, Non tender,  BS present Extremities: No edema     Data Reviewed:   CBC: Recent Labs  Lab 06/01/17 1041 06/02/17 0020 06/03/17 0600  WBC 9.9 7.9 10.6*  NEUTROABS  --  5.0  --   HGB 11.5* 10.4* 11.3*  HCT 36.0 33.0* 37.0  MCV  91.1 89.7 90.0  PLT 381 316 382   Basic Metabolic Panel: Recent Labs  Lab 06/01/17 1041 06/02/17 0514 06/03/17 0600 06/04/17 0846  NA 141 141 141 139  K 4.5 3.3* 4.4 5.0  CL 106 105 106 105  CO2 20* 26 26 24   GLUCOSE 114* 73 66 141*  BUN 32* 31* 28* 32*  CREATININE 1.78* 1.45* 1.46* 2.01*  CALCIUM 8.8* 8.7* 8.9 8.8*   GFR: Estimated Creatinine Clearance: 24.9 mL/min (A) (by C-G formula based on SCr of 2.01 mg/dL (H)). Liver Function Tests: No results for input(s): AST, ALT, ALKPHOS, BILITOT, PROT, ALBUMIN in the last 168 hours. No results for input(s): LIPASE, AMYLASE in the last 168 hours. No results for input(s): AMMONIA in the last 168 hours. Coagulation Profile: No results for input(s): INR, PROTIME in the last 168 hours. Cardiac Enzymes: No results for input(s): CKTOTAL, CKMB, CKMBINDEX, TROPONINI in the last 168 hours. BNP (last 3 results) No results for input(s): PROBNP in the last 8760 hours. HbA1C: Recent Labs    06/01/17 1903  HGBA1C 7.5*   CBG: Recent Labs  Lab 06/03/17 1651 06/03/17 2139 06/04/17 0216 06/04/17 0749 06/04/17 1157  GLUCAP 148* 146* 182* 138* 88   Lipid Profile: Recent Labs    06/02/17 0514  CHOL 113  HDL 14*  LDLCALC 63  TRIG 180*  CHOLHDL 8.1   Thyroid Function Tests: Recent Labs    06/01/17 1903  TSH 3.128   Anemia Panel: No results for input(s): VITAMINB12, FOLATE, FERRITIN, TIBC, IRON, RETICCTPCT in the last 72 hours. Urine analysis: No results found for: COLORURINE, APPEARANCEUR, LABSPEC, PHURINE, GLUCOSEU, HGBUR, BILIRUBINUR, KETONESUR, PROTEINUR, UROBILINOGEN, NITRITE, LEUKOCYTESUR Sepsis Labs: @LABRCNTIP (procalcitonin:4,lacticidven:4)  )No results found for this or any previous visit (from the past 240 hour(s)).       Radiology Studies: No results found.      Scheduled Meds: . apixaban  5 mg Oral BID  . aspirin EC  81 mg Oral Daily  . atorvastatin  80 mg Oral Daily  . fenofibrate  160 mg Oral  Daily  . insulin aspart  0-15 Units Subcutaneous TID WC  . insulin glargine  25 Units Subcutaneous QHS  . multivitamin  1 tablet Oral Daily  . pantoprazole  40 mg Oral Daily  . polyvinyl alcohol  1 drop Both Eyes Daily  . sodium chloride flush  3 mL Intravenous Q12H   Continuous Infusions: . sodium chloride       LOS: 3 days    Time spent: 87min    Dana Allan, MD [facility] Page via [email or web address], password [email or web address] After 7PM please contact night-coverage  06/04/2017, 12:13 PM

## 2017-06-04 NOTE — Progress Notes (Signed)
This nurse called by telemetry at [place]. Patient heart rate dropped to 32bpm afib. Previously in shift, patients heart rate 70-90's afib on diltiazem drip at 22ml/hr. Diltiazem drip stopped immediately and patient assessed. No complaints per patient. EKG obtained "afib with slow ventricular response" noted at 47 bpm. With diltiazem drip stopped heart rate has maintained 42-56 bpm (while patient sleeping) without out any more instances of heart rate in the 30's.   Bill RN [id number]  [place] paged the following update 0445:  "6E14: Gloria [name], J. Pts HR dropped to 32bpm, dilt drip stopped immediately, EKG uploaded to chart. Pt. asymptomatic. HR now 42-56bpm w/o any drops to 30s"

## 2017-06-04 NOTE — Progress Notes (Signed)
Progress Note  Patient Name: Gloria [name] Date of Encounter: 06/04/2017  Primary Cardiologist:Riann Oman  Subjective   Chest feels a little tight   Like cant get good breath    Inpatient Medications    Scheduled Meds: . apixaban  5 mg Oral BID  . aspirin EC  81 mg Oral Daily  . atorvastatin  80 mg Oral Daily  . fenofibrate  160 mg Oral Daily  . insulin aspart  0-15 Units Subcutaneous TID WC  . insulin glargine  25 Units Subcutaneous QHS  . multivitamin  1 tablet Oral Daily  . pantoprazole  40 mg Oral Daily  . polyvinyl alcohol  1 drop Both Eyes Daily  . sodium chloride flush  3 mL Intravenous Q12H  . sotalol  80 mg Oral Q12H   Continuous Infusions: . sodium chloride    . diltiazem (CARDIZEM) infusion Stopped (06/04/17 0145)   PRN Meds: sodium chloride, acetaminophen, diphenhydrAMINE, HYDROcodone-acetaminophen, ondansetron (ZOFRAN) IV, sodium chloride flush   Vital Signs    Vitals:   06/03/17 2100 06/04/17 0525 06/04/17 0532 06/04/17 0751  BP: 114/77 (!) 105/35  (!) 104/58  Pulse: (!) 113 (!) 46  (!) 44  Resp: 19 19  18   Temp: 98 F (36.7 C) (!) 97.2 F (36.2 C) 97.6 F (36.4 C) 98 F (36.7 C)  TempSrc: Oral Oral Oral Axillary  SpO2: 95% 93%  91%  Weight:      Height:        Intake/Output Summary (Last 24 hours) at 06/04/2017 0806 Last data filed at 06/03/2017 2233 Gross per 24 hour  Intake 789.75 ml  Output -  Net 789.75 ml   Filed Weights   06/01/17 1830 06/02/17 0624 06/03/17 0550  Weight: 188 lb 1.6 oz (85.3 kg) 185 lb 4.8 oz (84.1 kg) 182 lb 4.8 oz (82.7 kg)    Telemetry      ECG      Physical Exam   GEN: MIldly SOB   Neck: Neck is full    Cardiac: RRR, no murmurs, rubs, or gallops.  Respiratory: Clear to auscultation bilaterally. GI: Soft, nontender, non-distended  MS: No edema; No deformity. Neuro:  Nonfocal  Psych: Normal affect   Labs    Chemistry Recent Labs  Lab 06/01/17 1041 06/02/17 0514 06/03/17 0600  NA 141 141  141  K 4.5 3.3* 4.4  CL 106 105 106  CO2 20* 26 26  GLUCOSE 114* 73 66  BUN 32* 31* 28*  CREATININE 1.78* 1.45* 1.46*  CALCIUM 8.8* 8.7* 8.9  GFRNONAA 26* 34* 33*  GFRAA 31* 39* 39*  ANIONGAP 15 10 9      Hematology Recent Labs  Lab 06/01/17 1041 06/02/17 0020 06/03/17 0600  WBC 9.9 7.9 10.6*  RBC 3.95 3.68* 4.11  HGB 11.5* 10.4* 11.3*  HCT 36.0 33.0* 37.0  MCV 91.1 89.7 90.0  MCH 29.1 28.3 27.5  MCHC 31.9 31.5 30.5  RDW 15.0 15.1 15.2  PLT 381 316 333    Cardiac EnzymesNo results for input(s): TROPONINI in the last 168 hours.  Recent Labs  Lab 05/28/17 1117 06/01/17 1052  TROPIPOC 0.00 0.00     BNP Recent Labs  Lab 05/28/17 1258 06/01/17 1041  BNP 545.3* 1,532.3*     DDimer  Recent Labs  Lab 05/28/17 1332  DDIMER 3.09*     Radiology    No results found.  Cardiac Studies     Patient Profile     77 y.o. female admitted with symptomatic  PAF    Assessment & Plan    1  Atrial fib   SOtalol started 2 days ago   Yesterday am was in SR  Then went into atrial fib  Reverted to SR then back into afib with RVR yesterday evening   IV diltiazem started   Heart rates gradually came down   Around 2 am abrupt to 40s (junc/SB)  Diltiazem stopped Pt remains bradycardic    WIll hold meds for now   REview with EP  COntinue Eliquis     2  Acute diastolic CHF  PT feels full  Prob from some diastolic dysfunction with afib /tachy  With Cr bump I would hold        3  CKD  Cr up to 2 from 1.46  WIll follow later today   4  HL  On statin  5  HTN  Follow   Dorris Carnes   For questions or updates, please contact [facility] Please consult [email or web address] for contact info under Cardiology/STEMI.      Signed, Dorris Carnes, MD  06/04/2017, 8:06 AM

## 2017-06-04 NOTE — Consult Note (Signed)
ELECTROPHYSIOLOGY CONSULT NOTE    Patient ID: Gloria [name] MRN: [id number], DOB/AGE: 1941/01/06 77 y.o.  Admit date: 06/01/2017 Date of Consult: 06/04/2017  Primary Physician: Haywood Pao, MD Primary Cardiologist: Harrington Challenger  Patient Profile: Gloria [name] is a 77 y.o. female with a history of atrial fibrillation, HTN, diabetes, CKD who is being seen today for the evaluation of AF with RVR and bradycardia at the request of Dr Harrington Challenger.  HPI:  Gloria [name] is a 77 y.o. female with the above past medical history. She was first diagnosed with atrial fibrillation in 2007.  Her episodes have been sporadic and typically very short lived for the last several years.  The week before admission, she developed progressive shortness of breath and orthostasis along with palpitations.  She presented to the ER and was found to be in AF with RVR. She spontaneously converted to SR.  Since admission, she has had intermittent AF with RVR and was started on Sotalol.  She then developed symptomatic bradycardia. EP has been asked to evaluate for treatment options. When bradycardic, she was pre-syncopal and had some confusion.  She has been on Metoprolol 100mg  daily at home for years with no issues.  Echo this admission demonstrated EF 55%, no RWMA, grade 2 diastolic dysfunction, LA 40.    She denies chest pain, palpitations, dyspnea, PND, orthopnea, nausea, vomiting, dizziness, syncope, edema, weight gain, or early satiety.  Past Medical History:  Diagnosis Date  . Arthritis   . Atrial fibrillation ([place])   . Carotid artery occlusion   . Diabetes mellitus   . Diabetic retinopathy ([place])   . GERD (gastroesophageal reflux disease)   . Hyperlipidemia   . Hypertension   . Macular degeneration    Right eye only  . Peripheral vascular disease (HCC)    hx rt carotid s/p  . Pneumonia    hx  . PONV (postoperative nausea and vomiting)      Surgical History:  Past Surgical History:    Procedure Laterality Date  . CAROTID ENDARTERECTOMY  Jan. 19, 2007   Right  cea  . CHOLECYSTECTOMY  1968  . LUMBAR LAMINECTOMY/DECOMPRESSION MICRODISCECTOMY  02/27/2012   Procedure: LUMBAR LAMINECTOMY/DECOMPRESSION MICRODISCECTOMY 2 LEVELS;  Surgeon: Kristeen Miss, MD;  Location: [facility];  Service: Neurosurgery;  Laterality: N/A;  Lumbar two-three, four-five Laminectomy/foraminotomy  . SPINAL CORD DECOMPRESSION  Oct. 2013     Medications Prior to Admission  Medication Sig Dispense Refill Last Dose  . Acetaminophen (TYLENOL PO) Take 1 tablet by mouth as needed (Pain).   unknown at prn  . aspirin 325 MG tablet Take 325 mg by mouth daily. HOLD DUE TO SURGERY   06/01/2017 at Unknown time  . atorvastatin (LIPITOR) 80 MG tablet Take 80 mg by mouth daily.   05/31/2017 at Unknown time  . carboxymethylcellulose (REFRESH PLUS) 0.5 % SOLN Place 1 drop into both eyes daily.   06/01/2017 at Unknown time  . diphenhydrAMINE (BENADRYL) 25 MG tablet Take 25 mg by mouth every 6 (six) hours as needed for allergies.   Past Month at prn  . fenofibrate 160 MG tablet Take 160 mg by mouth daily.   05/31/2017 at Unknown time  . hydrochlorothiazide (HYDRODIURIL) 25 MG tablet Take 25 mg by mouth daily.   06/01/2017 at 0800  . HYDROcodone-acetaminophen (NORCO/VICODIN) 5-325 MG per tablet Take 1-2 tablets by mouth every 4 (four) hours as needed for pain. 60 tablet 0 unknown at prn  . insulin glargine (LANTUS) 100 UNIT/ML  injection Inject 34 Units into the skin at bedtime.    05/31/2017 at 2130  . JANUVIA 100 MG tablet Take 100 mg by mouth daily.  1 06/01/2017 at Unknown time  . lidocaine (LIDODERM) 5 % Place 1 patch onto the skin as needed (Back Pain). Remove & Discard patch within 12 hours or as directed by MD   Past Week at prn  . losartan (COZAAR) 50 MG tablet Take 50 mg by mouth daily.   05/31/2017 at Unknown time  . meloxicam (MOBIC) 15 MG tablet Take 15 mg by mouth as needed for pain. ON HOLD DUE TO SURGERY   Past  Month at prn  . Menthol, Topical Analgesic, (BIOFREEZE EX) Apply 1 application topically as needed (Back and Knee pain).   05/28/2017  . metFORMIN (GLUCOPHAGE) 500 MG tablet Take 500 mg by mouth See admin instructions. Patient takes 500mg  in the morning and 1000mg  in the evening   06/01/2017 at Unknown time  . metoprolol (LOPRESSOR) 50 MG tablet Take 50 mg by mouth 2 (two) times daily.    06/01/2017 at 0830  . Multiple Vitamins-Minerals (PRESERVISION AREDS 2 PO) Take 1 tablet by mouth 2 (two) times daily.   06/01/2017 at Unknown time  . naproxen sodium (ALEVE) 220 MG tablet Take 220 mg by mouth daily as needed (back pain).   Past Week at Unknown time  . omeprazole (PRILOSEC) 20 MG capsule Take 20 mg by mouth daily.   06/01/2017 at Unknown time  . traMADol (ULTRAM) 50 MG tablet Take 50 mg by mouth daily. For pain- BACK  ( Extended Release )   05/31/2017 at prn    Inpatient Medications:  . apixaban  5 mg Oral BID  . aspirin EC  81 mg Oral Daily  . atorvastatin  80 mg Oral Daily  . fenofibrate  160 mg Oral Daily  . insulin aspart  0-15 Units Subcutaneous TID WC  . insulin glargine  25 Units Subcutaneous QHS  . multivitamin  1 tablet Oral Daily  . pantoprazole  40 mg Oral Daily  . polyvinyl alcohol  1 drop Both Eyes Daily  . sodium chloride flush  3 mL Intravenous Q12H    Allergies:  Allergies  Allergen Reactions  . Iodinated Diagnostic Agents Anaphylaxis  . Hibiclens [Chlorhexidine Gluconate]   . Iohexol      Desc: ANGIO EDEMA W/ ANAPHYLAXIS PRIOR... HIVES POST 13 HR PREP...OK/A.C.   . Levaquin [Levofloxacin In D5w]   . Oxycodone-Acetaminophen Nausea And Vomiting  . Propoxyphene N-Acetaminophen Nausea And Vomiting    Social History   Socioeconomic History  . Marital status: Divorced    Spouse name: Not on file  . Number of children: Not on file  . Years of education: Not on file  . Highest education level: Not on file  Social Needs  . Financial resource strain: Not on file  .  Food insecurity - worry: Not on file  . Food insecurity - inability: Not on file  . Transportation needs - medical: Not on file  . Transportation needs - non-medical: Not on file  Occupational History  . Occupation: Critical care nurse  Tobacco Use  . Smoking status: Former Smoker    Packs/day: 0.25    Years: 32.00    Pack years: 8.00    Types: Cigarettes    Last attempt to quit: 02/20/2004    Years since quitting: 13.2  . Smokeless tobacco: Never Used  Substance and Sexual Activity  . Alcohol use: No  .  Drug use: No  . Sexual activity: Yes    Birth control/protection: Post-menopausal  Other Topics Concern  . Not on file  Social History Narrative  . Not on file     Family History  Problem Relation Age of Onset  . Heart disease Mother        Heart Disease before age 28  . Heart disease Father   . Heart disease Sister        Heart Disease before age 80  . Breast cancer Daughter 34     Review of Systems: All other systems reviewed and are otherwise negative except as noted above.  Physical Exam: Vitals:   06/04/17 0525 06/04/17 0532 06/04/17 0751 06/04/17 1158  BP: (!) 105/35  (!) 104/58 (!) 127/42  Pulse: (!) 46  (!) 44 (!) 54  Resp: 19  18 18   Temp: (!) 97.2 F (36.2 C) 97.6 F (36.4 C) 98 F (36.7 C) 99.3 F (37.4 C)  TempSrc: Oral Oral Axillary Axillary  SpO2: 93%  91% 96%  Weight:      Height:        GEN- The patient is obese appearing, alert and oriented x 3 today.   HEENT: normocephalic, atraumatic; sclera clear, conjunctiva pink; hearing intact; oropharynx clear; neck supple Lungs- Clear to ausculation bilaterally, normal work of breathing.  No wheezes, rales, rhonchi Heart- Regular rate and rhythm  GI- soft, non-tender, non-distended, bowel sounds present Extremities- no clubbing, cyanosis, or edema MS- no significant deformity or atrophy Skin- warm and dry, no rash or lesion Psych- euthymic mood, full affect Neuro- strength and sensation are  intact  Labs:   Lab Results  Component Value Date   WBC 10.6 (H) 06/03/2017   HGB 11.3 (L) 06/03/2017   HCT 37.0 06/03/2017   MCV 90.0 06/03/2017   PLT 333 06/03/2017    Recent Labs  Lab 06/04/17 0846  NA 139  K 5.0  CL 105  CO2 24  BUN 32*  CREATININE 2.01*  CALCIUM 8.8*  GLUCOSE 141*      Radiology/Studies: Dg Chest 2 View  Result Date: 06/01/2017 CLINICAL DATA:  Shortness of Breath EXAM: CHEST  2 VIEW COMPARISON:  05/28/2017 FINDINGS: Cardiomegaly. Bibasilar atelectasis. No visible effusions. Aortic atherosclerosis diffusely. 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 IMPRESSION: Cardiomegaly with diffuse mild interstitial prominence, likely chronic interstitial changes. Bibasilar atelectasis. Electronically Signed   By: Rolm Baptise M.D.   On: 06/01/2017 11:16   Dg Chest 2 View  Result Date: 05/28/2017 CLINICAL DATA:  Atrial fibrillation.  Shortness of breath. EXAM: CHEST  2 VIEW COMPARISON:  CT 02/09/2012. FINDINGS: Mediastinum and hilar structures normal. Cardiomegaly with normal pulmonary vascularity. Low lung volumes with mild basilar atelectasis. No pleural effusion or pneumothorax. Thoracic spine scoliosis. IMPRESSION: 1.  Cardiomegaly. 2. Low lung volumes with mild basilar atelectasis. Small bilateral pleural effusions. Electronically Signed   By: Marcello Moores  Register   On: 05/28/2017 11:47   Nm Pulmonary Vent And Perf (v/q Scan)  Result Date: 05/28/2017 CLINICAL DATA:  Atrial fibrillation. Elevated D-dimer. Shortness of breath and cough. EXAM: NUCLEAR MEDICINE VENTILATION - PERFUSION LUNG SCAN TECHNIQUE: Ventilation images were obtained in multiple projections using inhaled aerosol Tc-28m DTPA. Perfusion images were obtained in multiple projections after intravenous injection of Tc-40m MAA. RADIOPHARMACEUTICALS:  22.3 mCi Technetium-15m DTPA aerosol inhalation and 3.7 mCi Technetium-7m MAA IV COMPARISON:  Chest  x-ray from earlier same day. FINDINGS: Ventilation: No focal ventilation defect. Perfusion:  No wedge shaped peripheral perfusion defects to suggest acute pulmonary embolism. IMPRESSION: No evidence of pulmonary embolism. Electronically Signed   By: Franki Cabot M.D.   On: 05/28/2017 19:49    YBW:[id number] rhythm, rate 47 (personally reviewed)  TELEMETRY: AF, SR, SB (personally reviewed)   Assessment/Plan: 1.  Persistent atrial fibrillation with RVR/symptomatic bradycardia She has symptomatic persistent AF with RVR Sotalol caused symptomatic bradycardia She is not a good candidate for Tikosyn with CKD She does not have CAD (normal myoview 2013) We discussed Flecainide as an option. She would need to be on AVN blocking therapy but has tolerated metoprolol for several years.  Dr Rayann Heman to discuss further this afternoon Lifestyle reviewed - weight loss and increased activity would help AF burden long term. She has DDD but enjoys water aerobics and has already been looking into chair yoga She snores but has never had sleep study - would order as an outpatient Would like to avoid amiodarone if able, but may need to use if Flecainide not successful in maintaining SR She is appropriately anticoagulated.  Dr Rayann Heman to see later today   Signed, Chanetta Marshall 06/04/2017 3:15 PM  I have seen, examined the patient, and reviewed the above assessment and plan.  Changes to above are made where necessary.  On exam, RRR.  She has failed medical therapy with sotalol.  Will start flecainide and follow closely.  Stop ASA if ok with primary team. EP to follow along.  Co Sign: Thompson Grayer, MD 06/04/2017 5:45 PM

## 2017-06-05 LAB — BASIC METABOLIC PANEL
Anion gap: 11 (ref 5–15)
BUN: 29 mg/dL — ABNORMAL HIGH (ref 6–20)
CALCIUM: 8.9 mg/dL (ref 8.9–10.3)
CHLORIDE: 103 mmol/L (ref 101–111)
CO2: 26 mmol/L (ref 22–32)
CREATININE: 1.46 mg/dL — AB (ref 0.44–1.00)
GFR calc Af Amer: 39 mL/min — ABNORMAL LOW (ref >60)
GFR calc non Af Amer: 33 mL/min — ABNORMAL LOW (ref >60)
Glucose, Bld: 121 mg/dL — ABNORMAL HIGH (ref 65–99)
POTASSIUM: 4.2 mmol/L (ref 3.5–5.1)
SODIUM: 140 mmol/L (ref 135–145)

## 2017-06-05 LAB — GLUCOSE, CAPILLARY
Glucose-Capillary: 101 mg/dL — ABNORMAL HIGH (ref 65–99)
Glucose-Capillary: 119 mg/dL — ABNORMAL HIGH (ref 65–99)
Glucose-Capillary: 159 mg/dL — ABNORMAL HIGH (ref 65–99)
Glucose-Capillary: 206 mg/dL — ABNORMAL HIGH (ref 65–99)

## 2017-06-05 MED ORDER — METOPROLOL TARTRATE 25 MG PO TABS
25.0000 mg | ORAL_TABLET | Freq: Four times a day (QID) | ORAL | Status: DC
  Administered 2017-06-05 – 2017-06-06 (×7): 25 mg via ORAL
  Filled 2017-06-05 (×8): qty 1

## 2017-06-05 MED ORDER — POTASSIUM CHLORIDE CRYS ER 20 MEQ PO TBCR
20.0000 meq | EXTENDED_RELEASE_TABLET | Freq: Once | ORAL | Status: AC
  Administered 2017-06-05: 20 meq via ORAL
  Filled 2017-06-05: qty 1

## 2017-06-05 MED ORDER — FUROSEMIDE 10 MG/ML IJ SOLN
60.0000 mg | Freq: Once | INTRAMUSCULAR | Status: AC
  Administered 2017-06-05: 60 mg via INTRAVENOUS
  Filled 2017-06-05: qty 6

## 2017-06-05 MED ORDER — METOPROLOL TARTRATE 5 MG/5ML IV SOLN
5.0000 mg | Freq: Once | INTRAVENOUS | Status: AC
  Administered 2017-06-05: 5 mg via INTRAVENOUS
  Filled 2017-06-05: qty 5

## 2017-06-05 MED ORDER — METOPROLOL SUCCINATE ER 50 MG PO TB24
50.0000 mg | ORAL_TABLET | Freq: Every day | ORAL | Status: DC
  Administered 2017-06-05: 50 mg via ORAL
  Filled 2017-06-05: qty 1

## 2017-06-05 MED ORDER — FLECAINIDE ACETATE 100 MG PO TABS
100.0000 mg | ORAL_TABLET | Freq: Two times a day (BID) | ORAL | Status: DC
  Administered 2017-06-05 – 2017-06-08 (×7): 100 mg via ORAL
  Filled 2017-06-05 (×7): qty 1

## 2017-06-05 NOTE — Progress Notes (Signed)
Progress Note  Patient Name: Gloria [name] Date of Encounter: 06/05/2017  Primary Cardiologist:Jacoya Bauman  Subjective   Chest full  Started after got out of bed   Inpatient Medications    Scheduled Meds: . apixaban  5 mg Oral BID  . aspirin EC  81 mg Oral Daily  . atorvastatin  80 mg Oral Daily  . fenofibrate  160 mg Oral Daily  . flecainide  100 mg Oral Q12H  . insulin aspart  0-15 Units Subcutaneous TID WC  . insulin glargine  25 Units Subcutaneous QHS  . metoprolol succinate  50 mg Oral Daily  . multivitamin  1 tablet Oral Daily  . pantoprazole  40 mg Oral Daily  . polyvinyl alcohol  1 drop Both Eyes Daily  . sodium chloride flush  3 mL Intravenous Q12H   Continuous Infusions: . sodium chloride     PRN Meds: sodium chloride, acetaminophen, diphenhydrAMINE, HYDROcodone-acetaminophen, ondansetron (ZOFRAN) IV, sodium chloride flush   Vital Signs    Vitals:   06/04/17 2050 06/05/17 0043 06/05/17 0445 06/05/17 0743  BP: 109/69 (!) 121/92 (!) 135/56 (!) 137/92  Pulse: 72 63 69 (!) 51  Resp: 18 18 18 18   Temp: 98.2 F (36.8 C) 98.2 F (36.8 C) 98.4 F (36.9 C) 98.4 F (36.9 C)  TempSrc: Oral Oral Oral Oral  SpO2: 94% 92% 91% 94%  Weight:   184 lb 8.4 oz (83.7 kg)   Height:        Intake/Output Summary (Last 24 hours) at 06/05/2017 0810 Last data filed at 06/04/2017 1930 Gross per 24 hour  Intake 505 ml  Output -  Net 505 ml   Filed Weights   06/02/17 0624 06/03/17 0550 06/05/17 0445  Weight: 185 lb 4.8 oz (84.1 kg) 182 lb 4.8 oz (82.7 kg) 184 lb 8.4 oz (83.7 kg)    Telemetry    Atrial fib now  120s    ECG      Physical Exam   GEN: MIldly SOB   Neck: Neck is full    Cardiac: Irreg irreg  No S3   No signif murmur  Respiratory: Clear to auscultation bilaterally. GI: Soft, nontender, non-distended  MS: No edema; No deformity. Neuro:  Nonfocal  Psych: Normal affect   Labs    Chemistry Recent Labs  Lab 06/03/17 0600 06/04/17 0846  06/05/17 0518  NA 141 139 140  K 4.4 5.0 4.2  CL 106 105 103  CO2 26 24 26   GLUCOSE 66 141* 121*  BUN 28* 32* 29*  CREATININE 1.46* 2.01* 1.46*  CALCIUM 8.9 8.8* 8.9  GFRNONAA 33* 23* 33*  GFRAA 39* 26* 39*  ANIONGAP 9 10 11      Hematology Recent Labs  Lab 06/01/17 1041 06/02/17 0020 06/03/17 0600  WBC 9.9 7.9 10.6*  RBC 3.95 3.68* 4.11  HGB 11.5* 10.4* 11.3*  HCT 36.0 33.0* 37.0  MCV 91.1 89.7 90.0  MCH 29.1 28.3 27.5  MCHC 31.9 31.5 30.5  RDW 15.0 15.1 15.2  PLT 381 316 333    Cardiac EnzymesNo results for input(s): TROPONINI in the last 168 hours.  Recent Labs  Lab 06/01/17 1052  TROPIPOC 0.00     BNP Recent Labs  Lab 06/01/17 1041  BNP 1,532.3*     DDimer  No results for input(s): DDIMER in the last 168 hours.   Radiology    No results found.  Cardiac Studies     Patient Profile     77 y.o. female  admitted with symptomatic PAF    Assessment & Plan    1  Atrial fib  EP now following  Pt on Flecanide bid  Currently in afib with RVR   Will increase b blocker (was slow yesterday after sotalol and diltiazem)  Follow HR   COntinue Eliquis     2  Acute diastolic CHF   Volume up a couple liters  Will give lasix x 1 and follow     3  CKD  Cr back to 1.46  Follow    4  HL  On statin  5  HTN  Follow   Dorris Carnes   For questions or updates, please contact [facility] Please consult [email or web address] for contact info under Cardiology/STEMI.      Signed, Dorris Carnes, MD  06/05/2017, 8:10 AM

## 2017-06-05 NOTE — Care Management Important Message (Signed)
Important Message  Patient Details  Name: Gloria [name] MRN: [id number] Date of Birth: [birth year]   Medicare Important Message Given:  Yes    Orbie Pyo 06/05/2017, 3:08 PM

## 2017-06-05 NOTE — Progress Notes (Signed)
Progress Note   Subjective   Back in afib.  SOB is stable.  No new concerns.  Inpatient Medications    Scheduled Meds: . apixaban  5 mg Oral BID  . aspirin EC  81 mg Oral Daily  . atorvastatin  80 mg Oral Daily  . fenofibrate  160 mg Oral Daily  . flecainide  100 mg Oral Q12H  . insulin aspart  0-15 Units Subcutaneous TID WC  . insulin glargine  25 Units Subcutaneous QHS  . metoprolol succinate  50 mg Oral Daily  . multivitamin  1 tablet Oral Daily  . pantoprazole  40 mg Oral Daily  . polyvinyl alcohol  1 drop Both Eyes Daily  . sodium chloride flush  3 mL Intravenous Q12H   Continuous Infusions: . sodium chloride     PRN Meds: sodium chloride, acetaminophen, diphenhydrAMINE, HYDROcodone-acetaminophen, ondansetron (ZOFRAN) IV, sodium chloride flush   Vital Signs    Vitals:   06/04/17 2050 06/05/17 0043 06/05/17 0445 06/05/17 0743  BP: 109/69 (!) 121/92 (!) 135/56 (!) 137/92  Pulse: 72 63 69 (!) 51  Resp: 18 18 18 18   Temp: 98.2 F (36.8 C) 98.2 F (36.8 C) 98.4 F (36.9 C) 98.4 F (36.9 C)  TempSrc: Oral Oral Oral Oral  SpO2: 94% 92% 91% 94%  Weight:   184 lb 8.4 oz (83.7 kg)   Height:        Intake/Output Summary (Last 24 hours) at 06/05/2017 0802 Last data filed at 06/04/2017 1930 Gross per 24 hour  Intake 505 ml  Output -  Net 505 ml   Filed Weights   06/02/17 0624 06/03/17 0550 06/05/17 0445  Weight: 185 lb 4.8 oz (84.1 kg) 182 lb 4.8 oz (82.7 kg) 184 lb 8.4 oz (83.7 kg)    Telemetry    afib has returned - Personally Reviewed  Physical Exam   GEN- The patient is elderly appearing, alert and oriented x 3 today.   Head- normocephalic, atraumatic Eyes-  Sclera clear, conjunctiva pink Ears- hearing intact Oropharynx- clear Neck- supple, Lungs- Clear to ausculation bilaterally, normal work of breathing Heart- irregular rate and rhythm  GI- soft, NT, ND, + BS Extremities- no clubbing, cyanosis, or edema  MS- no significant deformity or  atrophy Skin- no rash or lesion Psych- euthymic mood, full affect Neuro- strength and sensation are intact   Labs    Chemistry Recent Labs  Lab 06/03/17 0600 06/04/17 0846 06/05/17 0518  NA 141 139 140  K 4.4 5.0 4.2  CL 106 105 103  CO2 26 24 26   GLUCOSE 66 141* 121*  BUN 28* 32* 29*  CREATININE 1.46* 2.01* 1.46*  CALCIUM 8.9 8.8* 8.9  GFRNONAA 33* 23* 33*  GFRAA 39* 26* 39*  ANIONGAP 9 10 11      Hematology Recent Labs  Lab 06/01/17 1041 06/02/17 0020 06/03/17 0600  WBC 9.9 7.9 10.6*  RBC 3.95 3.68* 4.11  HGB 11.5* 10.4* 11.3*  HCT 36.0 33.0* 37.0  MCV 91.1 89.7 90.0  MCH 29.1 28.3 27.5  MCHC 31.9 31.5 30.5  RDW 15.0 15.1 15.2  PLT 381 316 333    Cardiac EnzymesNo results for input(s): TROPONINI in the last 168 hours.  Recent Labs  Lab 06/01/17 1052  TROPIPOC 0.00        Assessment & Plan    1.  Persistent atrial fibrillation She has returned to afib but is stable Increase flecainide to 100mg  BID at this time. Continue eliquis Increase metoprolol to  50mg  daily  2. Acute CHF Per general cardiology  3. Bradycardia Improved No indication for pacing at this time  Will need to remain in the hospital until rhythm is more stable and CHF has improved  Thompson Grayer MD, [facility] 06/05/2017 8:02 AM

## 2017-06-05 NOTE — Progress Notes (Signed)
PROGRESS NOTE    Gloria [name]  AJO:[id number] DOB: 1940/10/31 DOA: 06/01/2017 PCP: Haywood Pao, MD    Brief Narrative: Patient is a 77 year old Caucasian female with past medical history significant of carotid disease; HTN; HLD; DM; and afib not on AC presenting with SOB/orthopnea. Patient was recently seen in ER for Atrial fibrillation but preferred to be discharged back home to follow up with her PCP and get cardiology referral.  Over the weekend, patient developed intermittent diaphoresis and orthopnea.  She was unable to lie flat last night and had to sit up in a chair. In the emergency room, patient was noted to have atrial fibrillation with RVR and suspected pulmonary edema. Patient was started on a Cardizem drip and given IV Lasix, clinically improving transitioned to oral Cardizem now and converted to sinus rhythm last night. 1/15 pm started Sotalol per Cards improved and this morning back in AFib with RVR   06/05/17 - Patient seen alongside patient's daughter. EP input is appreciated. Patient is back to Afib. No SOB, no chest pain or palpitation.  Assessment & Plan:   Principal Problem:   Atrial fibrillation with rapid ventricular response (HCC) - Remote history of same, was on Cardizem gtt on admission now off -Converted to sinus rhythm overnight, -started Sotalol per Cards, this am back in Afib with RVR -ECHO with normal EF and wall motion, grade 2DD, TSH normal -Cardiology following -Chads 2 score is > 3, started ELiquis today -monitor HR - EP's input is highly appreciated (Patient is on Flecainide and Metoprolol)   ACute CHF/Pulm edema -improved with diuresis, appears euvolemic now -ECHO with normal EF and grade 2 DD -may need low dose oral lasix at discharge -urine output not recorded, weight improving - Patient is currently asymptomatic - IV Lasix 60mg  one dose given today.  DM -Hba1c is 7.5 -hold Glucophage, Januvia -continue moderate-scale  SSI  HTN -Hold ACE and stopped metoprolol and started Sotalol for afib/RVR -stopped HCTZ  HLD -Continue Lipitor  AKI on CKD2 -improving with diuresis and stopping NSAIDs/ACE -baseline around 1.6 - SCr is 1.46 today.  - Potassium is down to 4.2.  Suspected OSA -needs sleep study as OP  DVT prophylaxis: Eliquis Code Status:  Full Family Communication: Daughter at bedside Disposition Plan:  Home once clinically improved  Consultants:   Cardiology  Electrophysiology   Procedures:   Antimicrobials:    Subjective: -  Back to afib - No SOB or chest pain   Objective: Vitals:   06/05/17 0900 06/05/17 1100 06/05/17 1500 06/05/17 1630  BP: 126/85 129/74 116/65 119/69  Pulse: (!) 129  (!) 108 (!) 107  Resp:    18  Temp:    99.6 F (37.6 C)  TempSrc:    Oral  SpO2:    95%  Weight:      Height:        Intake/Output Summary (Last 24 hours) at 06/05/2017 1836 Last data filed at 06/05/2017 1815 Gross per 24 hour  Intake 725 ml  Output -  Net 725 ml   Filed Weights   06/02/17 0624 06/03/17 0550 06/05/17 0445  Weight: 84.1 kg (185 lb 4.8 oz) 82.7 kg (182 lb 4.8 oz) 83.7 kg (184 lb 8.4 oz)    Examination:  Gen: Awake, Alert, Oriented X 3, no distress HEENT: no JVD Lungs: clear bilaterally CVS: S1S2, regular Abd: Obese, soft, Non tender,  BS present Extremities: No edema     Data Reviewed:   CBC: Recent Labs  Lab 06/01/17 1041 06/02/17 0020 06/03/17 0600  WBC 9.9 7.9 10.6*  NEUTROABS  --  5.0  --   HGB 11.5* 10.4* 11.3*  HCT 36.0 33.0* 37.0  MCV 91.1 89.7 90.0  PLT 381 316 326   Basic Metabolic Panel: Recent Labs  Lab 06/01/17 1041 06/02/17 0514 06/03/17 0600 06/04/17 0846 06/05/17 0518  NA 141 141 141 139 140  K 4.5 3.3* 4.4 5.0 4.2  CL 106 105 106 105 103  CO2 20* 26 26 24 26   GLUCOSE 114* 73 66 141* 121*  BUN 32* 31* 28* 32* 29*  CREATININE 1.78* 1.45* 1.46* 2.01* 1.46*  CALCIUM 8.8* 8.7* 8.9 8.8* 8.9   GFR: Estimated  Creatinine Clearance: 34.5 mL/min (A) (by C-G formula based on SCr of 1.46 mg/dL (H)). Liver Function Tests: No results for input(s): AST, ALT, ALKPHOS, BILITOT, PROT, ALBUMIN in the last 168 hours. No results for input(s): LIPASE, AMYLASE in the last 168 hours. No results for input(s): AMMONIA in the last 168 hours. Coagulation Profile: No results for input(s): INR, PROTIME in the last 168 hours. Cardiac Enzymes: No results for input(s): CKTOTAL, CKMB, CKMBINDEX, TROPONINI in the last 168 hours. BNP (last 3 results) No results for input(s): PROBNP in the last 8760 hours. HbA1C: No results for input(s): HGBA1C in the last 72 hours. CBG: Recent Labs  Lab 06/04/17 1632 06/04/17 2054 06/05/17 0740 06/05/17 1157 06/05/17 1634  GLUCAP 117* 168* 101* 119* 159*   Lipid Profile: No results for input(s): CHOL, HDL, LDLCALC, TRIG, CHOLHDL, LDLDIRECT in the last 72 hours. Thyroid Function Tests: No results for input(s): TSH, T4TOTAL, FREET4, T3FREE, THYROIDAB in the last 72 hours. Anemia Panel: No results for input(s): VITAMINB12, FOLATE, FERRITIN, TIBC, IRON, RETICCTPCT in the last 72 hours. Urine analysis: No results found for: COLORURINE, APPEARANCEUR, LABSPEC, PHURINE, GLUCOSEU, HGBUR, BILIRUBINUR, KETONESUR, PROTEINUR, UROBILINOGEN, NITRITE, LEUKOCYTESUR Sepsis Labs: @LABRCNTIP (procalcitonin:4,lacticidven:4)  )No results found for this or any previous visit (from the past 240 hour(s)).       Radiology Studies: No results found.      Scheduled Meds: . apixaban  5 mg Oral BID  . aspirin EC  81 mg Oral Daily  . atorvastatin  80 mg Oral Daily  . fenofibrate  160 mg Oral Daily  . flecainide  100 mg Oral Q12H  . insulin aspart  0-15 Units Subcutaneous TID WC  . insulin glargine  25 Units Subcutaneous QHS  . metoprolol tartrate  25 mg Oral QID  . multivitamin  1 tablet Oral Daily  . pantoprazole  40 mg Oral Daily  . polyvinyl alcohol  1 drop Both Eyes Daily  . sodium  chloride flush  3 mL Intravenous Q12H   Continuous Infusions: . sodium chloride       LOS: 4 days    Time spent: 3min    Dana Allan, MD [facility] Page via [email or web address], password [email or web address] After 7PM please contact night-coverage  06/05/2017, 6:36 PM

## 2017-06-06 LAB — BASIC METABOLIC PANEL
ANION GAP: 11 (ref 5–15)
BUN: 27 mg/dL — ABNORMAL HIGH (ref 6–20)
CO2: 25 mmol/L (ref 22–32)
Calcium: 9 mg/dL (ref 8.9–10.3)
Chloride: 101 mmol/L (ref 101–111)
Creatinine, Ser: 1.37 mg/dL — ABNORMAL HIGH (ref 0.44–1.00)
GFR calc non Af Amer: 36 mL/min — ABNORMAL LOW (ref >60)
GFR, EST AFRICAN AMERICAN: 42 mL/min — AB (ref >60)
Glucose, Bld: 213 mg/dL — ABNORMAL HIGH (ref 65–99)
POTASSIUM: 4.5 mmol/L (ref 3.5–5.1)
Sodium: 137 mmol/L (ref 135–145)

## 2017-06-06 LAB — GLUCOSE, CAPILLARY
GLUCOSE-CAPILLARY: 172 mg/dL — AB (ref 65–99)
GLUCOSE-CAPILLARY: 173 mg/dL — AB (ref 65–99)
GLUCOSE-CAPILLARY: 248 mg/dL — AB (ref 65–99)
Glucose-Capillary: 134 mg/dL — ABNORMAL HIGH (ref 65–99)

## 2017-06-06 NOTE — Progress Notes (Signed)
PROGRESS NOTE    Gloria [name]  ZOX:[id number] DOB: June 23, 1940 DOA: 06/01/2017 PCP: Haywood Pao, MD    Brief Narrative: Patient is a 77 year old Caucasian female with past medical history significant of carotid disease; HTN; HLD; DM; and afib not on AC presenting with SOB/orthopnea. Patient was recently seen in ER for Atrial fibrillation but preferred to be discharged back home to follow up with her PCP and get cardiology referral.  Over the weekend, patient developed intermittent diaphoresis and orthopnea.  She was unable to lie flat last night and had to sit up in a chair. In the emergency room, patient was noted to have atrial fibrillation with RVR and suspected pulmonary edema. Patient was started on a Cardizem drip and given IV Lasix, clinically improving transitioned to oral Cardizem now and converted to sinus rhythm last night. 1/15 pm started Sotalol per Cards improved and this morning back in AFib with RVR   06/06/17 - Patient seen alongside patient's daughter. Rate controlled, but in atrial flutter. EP input is highly appreciated (possible cardioversion if not back to NSR in am). No chest pain or SOB.  Assessment & Plan:   Principal Problem:   Atrial fibrillation/atrial flutter with rapid ventricular response (HCC) - Remote history of same, was on Cardizem gtt on admission now off -Converted to sinus rhythm overnight, -started Sotalol per Cards, this am back in Afib with RVR -ECHO with normal EF and wall motion, grade 2DD, TSH normal -Cardiology following -Chads 2 score is > 3, started ELiquis today -monitor HR - Medications are currently on hold - Possible cardioversion in am as per EP if patient is not back to NSR.   Acute diastolic CHF/Pulm edema -improved with diuresis, appears euvolemic now -ECHO with normal EF and grade 2 DD -may need low dose oral lasix at discharge -urine output not recorded, weight improving - Patient is currently  asymptomatic  DM -Hba1c is 7.5 -hold Glucophage, Januvia -continue moderate-scale SSI  HTN -Hold ACE and stopped metoprolol and started Sotalol for afib/RVR -stopped HCTZ  HLD -Continue Lipitor  AKI on CKD2 -improving with diuresis and stopping NSAIDs/ACE -baseline around 1.6 - SCr is 1.37   Suspected OSA -needs sleep study as OP  DVT prophylaxis: Eliquis Code Status:  Full Family Communication: Daughter at bedside Disposition Plan:  Home once clinically improved  Consultants:   Cardiology   Procedures:   Antimicrobials:    Subjective: - Bradycardia and Hypotension noted over the last 24 hours. - Patient is not back to baseline - Currently, no SOB or chest pain - Worsening renal function noted  Objective: Vitals:   06/06/17 0459 06/06/17 0724 06/06/17 1307 06/06/17 1403  BP: 137/66 (!) 145/90 130/75 (!) 101/51  Pulse: 100 99 98 95  Resp: 17 18  18   Temp: 98.2 F (36.8 C) 98.1 F (36.7 C)  98.3 F (36.8 C)  TempSrc: Oral Oral  Oral  SpO2: (!) 9% 100%  98%  Weight: 80.1 kg (176 lb 8 oz)     Height:        Intake/Output Summary (Last 24 hours) at 06/06/2017 1413 Last data filed at 06/06/2017 0600 Gross per 24 hour  Intake 365 ml  Output -  Net 365 ml   Filed Weights   06/03/17 0550 06/05/17 0445 06/06/17 0459  Weight: 82.7 kg (182 lb 4.8 oz) 83.7 kg (184 lb 8.4 oz) 80.1 kg (176 lb 8 oz)    Examination:  Gen: Awake, Alert, Oriented X 3, no distress  HEENT: no JVD Lungs: clear bilaterally CVS: S1S2, irregular Abd: Obese, soft, Non tender,  BS present Extremities: No edema  Data Reviewed:   CBC: Recent Labs  Lab 06/01/17 1041 06/02/17 0020 06/03/17 0600  WBC 9.9 7.9 10.6*  NEUTROABS  --  5.0  --   HGB 11.5* 10.4* 11.3*  HCT 36.0 33.0* 37.0  MCV 91.1 89.7 90.0  PLT 381 316 283   Basic Metabolic Panel: Recent Labs  Lab 06/02/17 0514 06/03/17 0600 06/04/17 0846 06/05/17 0518 06/06/17 0822  NA 141 141 139 140 137  K 3.3*  4.4 5.0 4.2 4.5  CL 105 106 105 103 101  CO2 26 26 24 26 25   GLUCOSE 73 66 141* 121* 213*  BUN 31* 28* 32* 29* 27*  CREATININE 1.45* 1.46* 2.01* 1.46* 1.37*  CALCIUM 8.7* 8.9 8.8* 8.9 9.0   GFR: Estimated Creatinine Clearance: 35.9 mL/min (A) (by C-G formula based on SCr of 1.37 mg/dL (H)). Liver Function Tests: No results for input(s): AST, ALT, ALKPHOS, BILITOT, PROT, ALBUMIN in the last 168 hours. No results for input(s): LIPASE, AMYLASE in the last 168 hours. No results for input(s): AMMONIA in the last 168 hours. Coagulation Profile: No results for input(s): INR, PROTIME in the last 168 hours. Cardiac Enzymes: No results for input(s): CKTOTAL, CKMB, CKMBINDEX, TROPONINI in the last 168 hours. BNP (last 3 results) No results for input(s): PROBNP in the last 8760 hours. HbA1C: No results for input(s): HGBA1C in the last 72 hours. CBG: Recent Labs  Lab 06/05/17 1157 06/05/17 1634 06/05/17 2048 06/06/17 0723 06/06/17 1102  GLUCAP 119* 159* 206* 134* 172*   Lipid Profile: No results for input(s): CHOL, HDL, LDLCALC, TRIG, CHOLHDL, LDLDIRECT in the last 72 hours. Thyroid Function Tests: No results for input(s): TSH, T4TOTAL, FREET4, T3FREE, THYROIDAB in the last 72 hours. Anemia Panel: No results for input(s): VITAMINB12, FOLATE, FERRITIN, TIBC, IRON, RETICCTPCT in the last 72 hours. Urine analysis: No results found for: COLORURINE, APPEARANCEUR, LABSPEC, PHURINE, GLUCOSEU, HGBUR, BILIRUBINUR, KETONESUR, PROTEINUR, UROBILINOGEN, NITRITE, LEUKOCYTESUR Sepsis Labs: @LABRCNTIP (procalcitonin:4,lacticidven:4)  )No results found for this or any previous visit (from the past 240 hour(s)).    Radiology Studies: No results found.  Scheduled Meds: . apixaban  5 mg Oral BID  . aspirin EC  81 mg Oral Daily  . atorvastatin  80 mg Oral Daily  . fenofibrate  160 mg Oral Daily  . flecainide  100 mg Oral Q12H  . insulin aspart  0-15 Units Subcutaneous TID WC  . insulin glargine   25 Units Subcutaneous QHS  . metoprolol tartrate  25 mg Oral QID  . multivitamin  1 tablet Oral Daily  . pantoprazole  40 mg Oral Daily  . polyvinyl alcohol  1 drop Both Eyes Daily  . sodium chloride flush  3 mL Intravenous Q12H   Continuous Infusions: . sodium chloride       LOS: 5 days    Time spent: 28min    Dana Allan, MD [facility] Page via [email or web address], password [email or web address] After 7PM please contact night-coverage  06/06/2017, 2:13 PM

## 2017-06-06 NOTE — Progress Notes (Signed)
Progress Note   Subjective   Doing well today, the patient denies CP.  SOB is improving.  No new concerns  Inpatient Medications    Scheduled Meds: . apixaban  5 mg Oral BID  . aspirin EC  81 mg Oral Daily  . atorvastatin  80 mg Oral Daily  . fenofibrate  160 mg Oral Daily  . flecainide  100 mg Oral Q12H  . insulin aspart  0-15 Units Subcutaneous TID WC  . insulin glargine  25 Units Subcutaneous QHS  . metoprolol tartrate  25 mg Oral QID  . multivitamin  1 tablet Oral Daily  . pantoprazole  40 mg Oral Daily  . polyvinyl alcohol  1 drop Both Eyes Daily  . sodium chloride flush  3 mL Intravenous Q12H   Continuous Infusions: . sodium chloride     PRN Meds: sodium chloride, acetaminophen, diphenhydrAMINE, HYDROcodone-acetaminophen, ondansetron (ZOFRAN) IV, sodium chloride flush   Vital Signs    Vitals:   06/05/17 2045 06/05/17 2355 06/06/17 0459 06/06/17 0724  BP: (!) 108/52 138/68 137/66 (!) 145/90  Pulse: (!) 107 (!) 108 100 99  Resp: 18 18 17 18   Temp: 98.4 F (36.9 C) 98 F (36.7 C) 98.2 F (36.8 C) 98.1 F (36.7 C)  TempSrc: Oral Oral Oral Oral  SpO2: 97% 94% (!) 9% 100%  Weight:   176 lb 8 oz (80.1 kg)   Height:        Intake/Output Summary (Last 24 hours) at 06/06/2017 1158 Last data filed at 06/06/2017 0600 Gross per 24 hour  Intake 365 ml  Output -  Net 365 ml   Filed Weights   06/03/17 0550 06/05/17 0445 06/06/17 0459  Weight: 182 lb 4.8 oz (82.7 kg) 184 lb 8.4 oz (83.7 kg) 176 lb 8 oz (80.1 kg)    Telemetry    afib has converted to atrial flutter - Personally Reviewed  Physical Exam   GEN- The patient is well appearing, alert and oriented x 3 today.   Head- normocephalic, atraumatic Eyes-  Sclera clear, conjunctiva pink Ears- hearing intact Oropharynx- clear Neck- supple, Lungs- Clear to ausculation bilaterally, normal work of breathing Heart- irregular rate and rhythm  GI- soft, NT, ND, + BS Extremities- no clubbing, cyanosis, or  edema  MS- no significant deformity or atrophy Skin- no rash or lesion Psych- euthymic mood, full affect Neuro- strength and sensation are intact   Labs    Chemistry Recent Labs  Lab 06/04/17 0846 06/05/17 0518 06/06/17 0822  NA 139 140 137  K 5.0 4.2 4.5  CL 105 103 101  CO2 24 26 25   GLUCOSE 141* 121* 213*  BUN 32* 29* 27*  CREATININE 2.01* 1.46* 1.37*  CALCIUM 8.8* 8.9 9.0  GFRNONAA 23* 33* 36*  GFRAA 26* 39* 42*  ANIONGAP 10 11 11      Hematology Recent Labs  Lab 06/01/17 1041 06/02/17 0020 06/03/17 0600  WBC 9.9 7.9 10.6*  RBC 3.95 3.68* 4.11  HGB 11.5* 10.4* 11.3*  HCT 36.0 33.0* 37.0  MCV 91.1 89.7 90.0  MCH 29.1 28.3 27.5  MCHC 31.9 31.5 30.5  RDW 15.0 15.1 15.2  PLT 381 316 333    Cardiac EnzymesNo results for input(s): TROPONINI in the last 168 hours.  Recent Labs  Lab 06/01/17 1052  TROPIPOC 0.00        Assessment & Plan    1.  Paroxysmal atrial fibrillation/ atrial flutter Continue current dose of flecainide Continue eliquis NPO after midnight.  If still in AF will plan cardioversion tomorrow.  2. Acute CHF Stable No change required today  3. Bradycardia Stable No change required today  Thompson Grayer MD, [facility] 06/06/2017 11:58 AM

## 2017-06-07 ENCOUNTER — Encounter (HOSPITAL_COMMUNITY): Payer: Self-pay | Admitting: Certified Registered Nurse Anesthetist

## 2017-06-07 ENCOUNTER — Inpatient Hospital Stay (HOSPITAL_COMMUNITY): Payer: Medicare Other | Admitting: Certified Registered Nurse Anesthetist

## 2017-06-07 ENCOUNTER — Encounter (HOSPITAL_COMMUNITY)
Admission: EM | Disposition: A | Discharge status: Home / Self Care | Payer: Self-pay | Source: Home / Self Care | Attending: Internal Medicine

## 2017-06-07 DIAGNOSIS — R001 Bradycardia, unspecified: Secondary | ICD-10-CM

## 2017-06-07 HISTORY — PX: CARDIOVERSION: SHX1299

## 2017-06-07 LAB — BASIC METABOLIC PANEL
Anion gap: 12 (ref 5–15)
BUN: 26 mg/dL — AB (ref 6–20)
CHLORIDE: 102 mmol/L (ref 101–111)
CO2: 24 mmol/L (ref 22–32)
CREATININE: 1.24 mg/dL — AB (ref 0.44–1.00)
Calcium: 8.9 mg/dL (ref 8.9–10.3)
GFR calc Af Amer: 47 mL/min — ABNORMAL LOW (ref >60)
GFR calc non Af Amer: 41 mL/min — ABNORMAL LOW (ref >60)
GLUCOSE: 116 mg/dL — AB (ref 65–99)
Potassium: 5.5 mmol/L — ABNORMAL HIGH (ref 3.5–5.1)
Sodium: 138 mmol/L (ref 135–145)

## 2017-06-07 LAB — GLUCOSE, CAPILLARY
GLUCOSE-CAPILLARY: 125 mg/dL — AB (ref 65–99)
GLUCOSE-CAPILLARY: 308 mg/dL — AB (ref 65–99)
Glucose-Capillary: 126 mg/dL — ABNORMAL HIGH (ref 65–99)
Glucose-Capillary: 132 mg/dL — ABNORMAL HIGH (ref 65–99)

## 2017-06-07 SURGERY — CARDIOVERSION
Anesthesia: Monitor Anesthesia Care

## 2017-06-07 MED ORDER — INSULIN GLARGINE 100 UNIT/ML ~~LOC~~ SOLN
34.0000 [IU] | Freq: Every day | SUBCUTANEOUS | Status: DC
  Administered 2017-06-07 – 2017-06-08 (×2): 34 [IU] via SUBCUTANEOUS
  Filled 2017-06-07 (×2): qty 0.34

## 2017-06-07 MED ORDER — METOPROLOL TARTRATE 12.5 MG HALF TABLET
12.5000 mg | ORAL_TABLET | Freq: Two times a day (BID) | ORAL | Status: DC
  Administered 2017-06-07 – 2017-06-09 (×4): 12.5 mg via ORAL
  Filled 2017-06-07 (×5): qty 1

## 2017-06-07 MED ORDER — LACTATED RINGERS IV SOLN
INTRAVENOUS | Status: DC | PRN
  Administered 2017-06-07: 09:00:00 via INTRAVENOUS

## 2017-06-07 MED ORDER — PHENYLEPHRINE HCL 10 MG/ML IJ SOLN
INTRAMUSCULAR | Status: DC | PRN
  Administered 2017-06-07: 80 ug via INTRAVENOUS

## 2017-06-07 MED ORDER — PROPOFOL 10 MG/ML IV BOLUS
INTRAVENOUS | Status: DC | PRN
  Administered 2017-06-07: 100 mg via INTRAVENOUS

## 2017-06-07 MED ORDER — LIDOCAINE HCL (CARDIAC) 20 MG/ML IV SOLN
INTRAVENOUS | Status: DC | PRN
  Administered 2017-06-07: 30 mg via INTRATRACHEAL

## 2017-06-07 NOTE — H&P (View-Only) (Signed)
Progress Note   Subjective   Doing well today, the patient denies CP or SOB.  No new concerns  Inpatient Medications    Scheduled Meds: . apixaban  5 mg Oral BID  . aspirin EC  81 mg Oral Daily  . atorvastatin  80 mg Oral Daily  . fenofibrate  160 mg Oral Daily  . flecainide  100 mg Oral Q12H  . insulin aspart  0-15 Units Subcutaneous TID WC  . insulin glargine  25 Units Subcutaneous QHS  . metoprolol tartrate  25 mg Oral QID  . multivitamin  1 tablet Oral Daily  . pantoprazole  40 mg Oral Daily  . polyvinyl alcohol  1 drop Both Eyes Daily  . sodium chloride flush  3 mL Intravenous Q12H   Continuous Infusions: . sodium chloride     PRN Meds: sodium chloride, acetaminophen, diphenhydrAMINE, HYDROcodone-acetaminophen, ondansetron (ZOFRAN) IV, sodium chloride flush   Vital Signs    Vitals:   06/06/17 1950 06/07/17 0019 06/07/17 0500 06/07/17 0731  BP: (!) 121/58 138/64 (!) 148/84 (!) 150/84  Pulse: 90  84 91  Resp: 17  16 18   Temp: 98 F (36.7 C) 98.4 F (36.9 C) 98 F (36.7 C) 98.3 F (36.8 C)  TempSrc: Oral Oral Oral Oral  SpO2: 92% 95% 92% 94%  Weight:   176 lb 9.6 oz (80.1 kg)   Height:        Intake/Output Summary (Last 24 hours) at 06/07/2017 8119 Last data filed at 06/06/2017 1700 Gross per 24 hour  Intake 480 ml  Output -  Net 480 ml   Filed Weights   06/05/17 0445 06/06/17 0459 06/07/17 0500  Weight: 184 lb 8.4 oz (83.7 kg) 176 lb 8 oz (80.1 kg) 176 lb 9.6 oz (80.1 kg)    Telemetry    Atrial flutter - Personally Reviewed  Physical Exam   GEN- The patient is well appearing, alert and oriented x 3 today.   Head- normocephalic, atraumatic Eyes-  Sclera clear, conjunctiva pink Ears- hearing intact Oropharynx- clear Neck- supple, Lungs- Clear to ausculation bilaterally, normal work of breathing Heart- irregular rate and rhythm  GI- soft, NT, ND, + BS Extremities- no clubbing, cyanosis, or edema  MS- no significant deformity or  atrophy Skin- no rash or lesion Psych- euthymic mood, full affect Neuro- strength and sensation are intact   Labs    Chemistry Recent Labs  Lab 06/04/17 0846 06/05/17 0518 06/06/17 0822  NA 139 140 137  K 5.0 4.2 4.5  CL 105 103 101  CO2 24 26 25   GLUCOSE 141* 121* 213*  BUN 32* 29* 27*  CREATININE 2.01* 1.46* 1.37*  CALCIUM 8.8* 8.9 9.0  GFRNONAA 23* 33* 36*  GFRAA 26* 39* 42*  ANIONGAP 10 11 11      Hematology Recent Labs  Lab 06/01/17 1041 06/02/17 0020 06/03/17 0600  WBC 9.9 7.9 10.6*  RBC 3.95 3.68* 4.11  HGB 11.5* 10.4* 11.3*  HCT 36.0 33.0* 37.0  MCV 91.1 89.7 90.0  MCH 29.1 28.3 27.5  MCHC 31.9 31.5 30.5  RDW 15.0 15.1 15.2  PLT 381 316 333    Cardiac EnzymesNo results for input(s): TROPONINI in the last 168 hours.  Recent Labs  Lab 06/01/17 1052  TROPIPOC 0.00        Assessment & Plan    1.  Acute diastolic CHF Echo 1/47/82 reviewed Clinically improving No changes  2. Bradycardia Stable No change required today  3. Paroxysmal atrial fibrillation/ atrial  flutter For cardioversion this am Risks and benefits discussed with the patient who wishes to proceed  Hopefully home tomorrow  Thompson Grayer MD, [facility] 06/07/2017 8:07 AM

## 2017-06-07 NOTE — Interval H&P Note (Signed)
History and Physical Interval Note:  06/07/2017 8:53 AM  Gloria [name]  has presented today for surgery, with the diagnosis of arrhythmia  The various methods of treatment have been discussed with the patient and family. After consideration of risks, benefits and other options for treatment, the patient has consented to  Procedure(s): CARDIOVERSION (N/A) as a surgical intervention .  The patient's history has been reviewed, patient examined, no change in status, stable for surgery.  I have reviewed the patient's chart and labs.  Questions were answered to the patient's satisfaction.     Thompson Grayer

## 2017-06-07 NOTE — Progress Notes (Signed)
Patient wants Lantus dose adjusted to home dose of 34 units, current order 25 units.  RN text paged [facility] with this information.

## 2017-06-07 NOTE — Progress Notes (Signed)
RN text paged [facility] requesting level of care be changed from stepdown to telemetry. Vital signs have been stable.  Patient no longer receiving IV medications for heart rate control.  Patient was successfully cardioverted today.

## 2017-06-07 NOTE — Progress Notes (Signed)
PROGRESS NOTE    Gloria [name]  HYW:[id number] DOB: Jun 27, 1940 DOA: 06/01/2017 PCP: Haywood Pao, MD    Brief Narrative: Patient is a 77 year old Caucasian female with past medical history significant of carotid disease; HTN; HLD; DM; and afib not on AC presenting with SOB/orthopnea. Patient was recently seen in ER for Atrial fibrillation but preferred to be discharged back home to follow up with her PCP and get cardiology referral.  Over the weekend, patient developed intermittent diaphoresis and orthopnea.  She was unable to lie flat last night and had to sit up in a chair. In the emergency room, patient was noted to have atrial fibrillation with RVR and suspected pulmonary edema. Patient was started on a Cardizem drip and given IV Lasix, clinically improving transitioned to oral Cardizem now and converted to sinus rhythm last night. 1/15 pm started Sotalol per Cards improved and this morning back in AFib with RVR   06/06/17 - Patient seen alongside patient's daughter. Rate controlled, but in atrial flutter. EP input is highly appreciated (possible cardioversion if not back to NSR in am). No chest pain or SOB.  06/07/17 -patient seen alongside patient's daughter.  No new complaints.  Patient underwent DC cardioversion.  Patient is back to sinus rhythm.  EP input is highly appreciated.  Assessment & Plan:   Principal Problem:   Atrial fibrillation/atrial flutter with rapid ventricular response (HCC) - Remote history of same, was on Cardizem gtt on admission now off -Converted to sinus rhythm overnight, -started Sotalol per Cards, this am back in Afib with RVR -ECHO with normal EF and wall motion, grade 2DD, TSH normal -Cardiology following -Chads 2 score is > 3, started ELiquis today -monitor HR - Medications are currently on hold - S/P cardioversion today (06/07/17), and patient is back to NSR.   Acute diastolic CHF/Pulm edema -improved with diuresis, appears euvolemic  now -ECHO with normal EF and grade 2 DD -may need low dose oral lasix at discharge -urine output not recorded, weight improving - Patient is currently asymptomatic  DM -Hba1c is 7.5 -hold Glucophage, Januvia -continue moderate-scale SSI  HTN -Hold ACE and stopped metoprolol and started Sotalol for afib/RVR -stopped HCTZ  HLD -Continue Lipitor  AKI on CKD2 -improving with diuresis and stopping NSAIDs/ACE -baseline around 1.6 - SCr is 1.37   Suspected OSA -needs sleep study as OP  DVT prophylaxis: Eliquis Code Status:  Full Family Communication: Daughter at bedside Disposition Plan:  Home once clinically improved  Consultants:   Cardiology   Procedures: DC cardioversion  Antimicrobials:    Subjective: - No SOB or chest pain   Objective: Vitals:   06/07/17 0500 06/07/17 0731 06/07/17 0900 06/07/17 0945  BP: (!) 148/84 (!) 150/84 (!) 106/47   Pulse: 84 91 (!) 49   Resp: 16 18 16    Temp: 98 F (36.7 C) 98.3 F (36.8 C) 97.7 F (36.5 C) (!) 97.3 F (36.3 C)  TempSrc: Oral Oral    SpO2: 92% 94% 90%   Weight: 80.1 kg (176 lb 9.6 oz)     Height:        Intake/Output Summary (Last 24 hours) at 06/07/2017 1407 Last data filed at 06/07/2017 1100 Gross per 24 hour  Intake 340 ml  Output -  Net 340 ml   Filed Weights   06/05/17 0445 06/06/17 0459 06/07/17 0500  Weight: 83.7 kg (184 lb 8.4 oz) 80.1 kg (176 lb 8 oz) 80.1 kg (176 lb 9.6 oz)    Examination:  Gen: Awake, Alert, Oriented X 3, no distress HEENT: no JVD Lungs: clear bilaterally CVS: S1S2, regular Abd: Obese, soft, Non tender,  BS present Extremities: No edema  Data Reviewed:   CBC: Recent Labs  Lab 06/01/17 1041 06/02/17 0020 06/03/17 0600  WBC 9.9 7.9 10.6*  NEUTROABS  --  5.0  --   HGB 11.5* 10.4* 11.3*  HCT 36.0 33.0* 37.0  MCV 91.1 89.7 90.0  PLT 381 316 287   Basic Metabolic Panel: Recent Labs  Lab 06/03/17 0600 06/04/17 0846 06/05/17 0518 06/06/17 0822  06/07/17 0655  NA 141 139 140 137 138  K 4.4 5.0 4.2 4.5 5.5*  CL 106 105 103 101 102  CO2 26 24 26 25 24   GLUCOSE 66 141* 121* 213* 116*  BUN 28* 32* 29* 27* 26*  CREATININE 1.46* 2.01* 1.46* 1.37* 1.24*  CALCIUM 8.9 8.8* 8.9 9.0 8.9   GFR: Estimated Creatinine Clearance: 39.7 mL/min (A) (by C-G formula based on SCr of 1.24 mg/dL (H)). Liver Function Tests: No results for input(s): AST, ALT, ALKPHOS, BILITOT, PROT, ALBUMIN in the last 168 hours. No results for input(s): LIPASE, AMYLASE in the last 168 hours. No results for input(s): AMMONIA in the last 168 hours. Coagulation Profile: No results for input(s): INR, PROTIME in the last 168 hours. Cardiac Enzymes: No results for input(s): CKTOTAL, CKMB, CKMBINDEX, TROPONINI in the last 168 hours. BNP (last 3 results) No results for input(s): PROBNP in the last 8760 hours. HbA1C: No results for input(s): HGBA1C in the last 72 hours. CBG: Recent Labs  Lab 06/06/17 1102 06/06/17 1637 06/06/17 2140 06/07/17 0730 06/07/17 1108  GLUCAP 172* 173* 248* 126* 132*   Lipid Profile: No results for input(s): CHOL, HDL, LDLCALC, TRIG, CHOLHDL, LDLDIRECT in the last 72 hours. Thyroid Function Tests: No results for input(s): TSH, T4TOTAL, FREET4, T3FREE, THYROIDAB in the last 72 hours. Anemia Panel: No results for input(s): VITAMINB12, FOLATE, FERRITIN, TIBC, IRON, RETICCTPCT in the last 72 hours. Urine analysis: No results found for: COLORURINE, APPEARANCEUR, LABSPEC, PHURINE, GLUCOSEU, HGBUR, BILIRUBINUR, KETONESUR, PROTEINUR, UROBILINOGEN, NITRITE, LEUKOCYTESUR Sepsis Labs: @LABRCNTIP (procalcitonin:4,lacticidven:4)  )No results found for this or any previous visit (from the past 240 hour(s)).    Radiology Studies: No results found.  Scheduled Meds: . apixaban  5 mg Oral BID  . aspirin EC  81 mg Oral Daily  . atorvastatin  80 mg Oral Daily  . fenofibrate  160 mg Oral Daily  . flecainide  100 mg Oral Q12H  . insulin aspart   0-15 Units Subcutaneous TID WC  . insulin glargine  25 Units Subcutaneous QHS  . metoprolol tartrate  12.5 mg Oral BID  . multivitamin  1 tablet Oral Daily  . pantoprazole  40 mg Oral Daily  . polyvinyl alcohol  1 drop Both Eyes Daily   Continuous Infusions: . sodium chloride       LOS: 6 days    Time spent: 15min    Dana Allan, MD [facility] Page via [email or web address], password [email or web address] After 7PM please contact night-coverage  06/07/2017, 2:07 PM

## 2017-06-07 NOTE — CV Procedure (Signed)
EP procedure Note   Pre procedure Diagnosis:  Persistent Atrial flutter Post procedure Diagnosis:  Same  Procedures:  Electrical cardioversion  Description:  Informed, written consent was obtained for cardioversion. Appropriate anticoagulation was confirmed.  Adequate IV acces and airway support were assured.  The patient was adequately sedated with intravenous propofol as outlined in the anesthesia report.  The patient presented today in atrial flutter.  She was successfully cardioverted to sinus rhythm with a single synchronized biphasic 200J shock delivered with cardioversion electrodes placed in the anterior/posterior configuration.  She remains in sinus rhythm thereafter. EBL 14ml. There were no early apparent complications.  Conclusions:  1.  Successful cardioversion of atrial flutter to sinus rhythm 2.  No early apparent complications.   Jeneen Rinks Tamelia Michalowski,MD 8:57 AM 06/07/2017

## 2017-06-07 NOTE — Progress Notes (Signed)
Progress Note   Subjective   Doing well today, the patient denies CP or SOB.  No new concerns  Inpatient Medications    Scheduled Meds: . apixaban  5 mg Oral BID  . aspirin EC  81 mg Oral Daily  . atorvastatin  80 mg Oral Daily  . fenofibrate  160 mg Oral Daily  . flecainide  100 mg Oral Q12H  . insulin aspart  0-15 Units Subcutaneous TID WC  . insulin glargine  25 Units Subcutaneous QHS  . metoprolol tartrate  25 mg Oral QID  . multivitamin  1 tablet Oral Daily  . pantoprazole  40 mg Oral Daily  . polyvinyl alcohol  1 drop Both Eyes Daily  . sodium chloride flush  3 mL Intravenous Q12H   Continuous Infusions: . sodium chloride     PRN Meds: sodium chloride, acetaminophen, diphenhydrAMINE, HYDROcodone-acetaminophen, ondansetron (ZOFRAN) IV, sodium chloride flush   Vital Signs    Vitals:   06/06/17 1950 06/07/17 0019 06/07/17 0500 06/07/17 0731  BP: (!) 121/58 138/64 (!) 148/84 (!) 150/84  Pulse: 90  84 91  Resp: 17  16 18   Temp: 98 F (36.7 C) 98.4 F (36.9 C) 98 F (36.7 C) 98.3 F (36.8 C)  TempSrc: Oral Oral Oral Oral  SpO2: 92% 95% 92% 94%  Weight:   176 lb 9.6 oz (80.1 kg)   Height:        Intake/Output Summary (Last 24 hours) at 06/07/2017 1610 Last data filed at 06/06/2017 1700 Gross per 24 hour  Intake 480 ml  Output -  Net 480 ml   Filed Weights   06/05/17 0445 06/06/17 0459 06/07/17 0500  Weight: 184 lb 8.4 oz (83.7 kg) 176 lb 8 oz (80.1 kg) 176 lb 9.6 oz (80.1 kg)    Telemetry    Atrial flutter - Personally Reviewed  Physical Exam   GEN- The patient is well appearing, alert and oriented x 3 today.   Head- normocephalic, atraumatic Eyes-  Sclera clear, conjunctiva pink Ears- hearing intact Oropharynx- clear Neck- supple, Lungs- Clear to ausculation bilaterally, normal work of breathing Heart- irregular rate and rhythm  GI- soft, NT, ND, + BS Extremities- no clubbing, cyanosis, or edema  MS- no significant deformity or  atrophy Skin- no rash or lesion Psych- euthymic mood, full affect Neuro- strength and sensation are intact   Labs    Chemistry Recent Labs  Lab 06/04/17 0846 06/05/17 0518 06/06/17 0822  NA 139 140 137  K 5.0 4.2 4.5  CL 105 103 101  CO2 24 26 25   GLUCOSE 141* 121* 213*  BUN 32* 29* 27*  CREATININE 2.01* 1.46* 1.37*  CALCIUM 8.8* 8.9 9.0  GFRNONAA 23* 33* 36*  GFRAA 26* 39* 42*  ANIONGAP 10 11 11      Hematology Recent Labs  Lab 06/01/17 1041 06/02/17 0020 06/03/17 0600  WBC 9.9 7.9 10.6*  RBC 3.95 3.68* 4.11  HGB 11.5* 10.4* 11.3*  HCT 36.0 33.0* 37.0  MCV 91.1 89.7 90.0  MCH 29.1 28.3 27.5  MCHC 31.9 31.5 30.5  RDW 15.0 15.1 15.2  PLT 381 316 333    Cardiac EnzymesNo results for input(s): TROPONINI in the last 168 hours.  Recent Labs  Lab 06/01/17 1052  TROPIPOC 0.00        Assessment & Plan    1.  Acute diastolic CHF Echo 9/60/45 reviewed Clinically improving No changes  2. Bradycardia Stable No change required today  3. Paroxysmal atrial fibrillation/ atrial  flutter For cardioversion this am Risks and benefits discussed with the patient who wishes to proceed  Hopefully home tomorrow  Thompson Grayer MD, [facility] 06/07/2017 8:07 AM

## 2017-06-07 NOTE — Anesthesia Postprocedure Evaluation (Signed)
Anesthesia Post Note  Patient: Gloria [name]  Procedure(s) Performed: CARDIOVERSION (N/A )     Patient location during evaluation: PACU Anesthesia Type: General Level of consciousness: awake and alert Pain management: pain level controlled Vital Signs Assessment: post-procedure vital signs reviewed and stable Respiratory status: spontaneous breathing, nonlabored ventilation, respiratory function stable and patient connected to nasal cannula oxygen Cardiovascular status: blood pressure returned to baseline and stable Postop Assessment: no apparent nausea or vomiting Anesthetic complications: no    Last Vitals:  Vitals:   06/07/17 0900 06/07/17 0945  BP: (!) 106/47   Pulse: (!) 49   Resp: 16   Temp: 36.5 C (!) 36.3 C  SpO2: 90%     Last Pain:  Vitals:   06/07/17 0731  TempSrc: Oral  PainSc:                  Cayne Yom COKER

## 2017-06-07 NOTE — Transfer of Care (Signed)
Immediate Anesthesia Transfer of Care Note  Patient: Gloria [name]  Procedure(s) Performed: CARDIOVERSION (N/A )  Patient Location: PACU  Anesthesia Type:General  Level of Consciousness: awake, drowsy and patient cooperative  Airway & Oxygen Therapy: Patient Spontanous Breathing and Patient connected to nasal cannula oxygen  Post-op Assessment: Report given to RN and Post -op Vital signs reviewed and stable  Post vital signs: Reviewed and stable  Last Vitals:  Vitals:   06/07/17 0500 06/07/17 0731  BP: (!) 148/84 (!) 150/84  Pulse: 84 91  Resp: 16 18  Temp: 36.7 C 36.8 C  SpO2: 92% 94%    Last Pain:  Vitals:   06/07/17 0731  TempSrc: Oral  PainSc:       Patients Stated Pain Goal: 0 (51/89/84 2103)  Complications: No apparent anesthesia complications

## 2017-06-07 NOTE — Anesthesia Preprocedure Evaluation (Signed)
Anesthesia Evaluation  Patient identified by MRN, date of birth, ID band Patient awake    Reviewed: Allergy & Precautions, NPO status , Patient's Chart, lab work & pertinent test results  Airway Mallampati: II  TM Distance: >3 FB Neck ROM: Full    Dental  (+) Teeth Intact, Dental Advisory Given   Pulmonary former smoker,    breath sounds clear to auscultation       Cardiovascular hypertension,  Rhythm:Irregular Rate:Normal     Neuro/Psych    GI/Hepatic   Endo/Other  diabetes  Renal/GU      Musculoskeletal   Abdominal   Peds  Hematology   Anesthesia Other Findings   Reproductive/Obstetrics                             Anesthesia Physical Anesthesia Plan  ASA: III  Anesthesia Plan: General   Post-op Pain Management:    Induction: Intravenous  PONV Risk Score and Plan: Ondansetron and Propofol infusion  Airway Management Planned: Mask  Additional Equipment:   Intra-op Plan:   Post-operative Plan:   Informed Consent: I have reviewed the patients History and Physical, chart, labs and discussed the procedure including the risks, benefits and alternatives for the proposed anesthesia with the patient or authorized representative who has indicated his/her understanding and acceptance.     Plan Discussed with: CRNA and Anesthesiologist  Anesthesia Plan Comments:         Anesthesia Quick Evaluation

## 2017-06-08 ENCOUNTER — Encounter (HOSPITAL_COMMUNITY): Payer: Self-pay | Admitting: Internal Medicine

## 2017-06-08 LAB — BASIC METABOLIC PANEL
Anion gap: 11 (ref 5–15)
BUN: 26 mg/dL — ABNORMAL HIGH (ref 6–20)
CO2: 25 mmol/L (ref 22–32)
Calcium: 9 mg/dL (ref 8.9–10.3)
Chloride: 100 mmol/L — ABNORMAL LOW (ref 101–111)
Creatinine, Ser: 1.21 mg/dL — ABNORMAL HIGH (ref 0.44–1.00)
GFR calc Af Amer: 49 mL/min — ABNORMAL LOW (ref >60)
GFR calc non Af Amer: 42 mL/min — ABNORMAL LOW (ref >60)
Glucose, Bld: 239 mg/dL — ABNORMAL HIGH (ref 65–99)
Potassium: 4 mmol/L (ref 3.5–5.1)
Sodium: 136 mmol/L (ref 135–145)

## 2017-06-08 LAB — GLUCOSE, CAPILLARY
GLUCOSE-CAPILLARY: 124 mg/dL — AB (ref 65–99)
GLUCOSE-CAPILLARY: 159 mg/dL — AB (ref 65–99)
Glucose-Capillary: 134 mg/dL — ABNORMAL HIGH (ref 65–99)
Glucose-Capillary: 180 mg/dL — ABNORMAL HIGH (ref 65–99)
Glucose-Capillary: 216 mg/dL — ABNORMAL HIGH (ref 65–99)

## 2017-06-08 MED ORDER — AMIODARONE HCL 200 MG PO TABS
200.0000 mg | ORAL_TABLET | Freq: Two times a day (BID) | ORAL | Status: DC
  Administered 2017-06-08 – 2017-06-09 (×3): 200 mg via ORAL
  Filled 2017-06-08 (×3): qty 1

## 2017-06-08 NOTE — Progress Notes (Signed)
Patient c/o some palpitations, EKG was done, reviewed with Dr. Rayann Heman, converted back to AFlutter, will stop Flecainide and start amiodarone,  Dr. Rayann Heman discussed patient's listed allergy to Iodine/iodinated agents/contrast with pharmacists, will proceed.   I have confirmed with pharmacy, no need to wait to start amiodarone after flecainide this AM.  In review with Dr. Rayann Heman, given rate fairly well controlled, still OK with discharge on amiodarone 200mg  BID, keep one week AFib clinic visit, if still in AFlutter, plan for a 2 week amiodarone load and repeat DCCV.  Patient can tell she is out of rhythm, though feels OK otherwise.  She is very reluctant about leaving today now back in AFlutter, daughter at bedside is as well.  They would like to the patient stay the night and if HR remains stable/controlled would feel better about leaving even if out of rhythm.  She is encouraged to be OOB to better assess rate control.  Tommye Standard, PA-C

## 2017-06-08 NOTE — Progress Notes (Signed)
First dose of Amiodarone given. Hand-out provided to patient and family. Pt had no further questions. Will continue to monitor.

## 2017-06-08 NOTE — Progress Notes (Signed)
ANTICOAGULATION CONSULT NOTE - Follow Up Consult  Pharmacy Consult for Eliquis Indication: atrial fibrillation  Allergies  Allergen Reactions  . Iodinated Diagnostic Agents Anaphylaxis  . Hibiclens [Chlorhexidine Gluconate]   . Iohexol      Desc: ANGIO EDEMA W/ ANAPHYLAXIS PRIOR... HIVES POST 13 HR PREP...OK/A.C.   . Levaquin [Levofloxacin In D5w]   . Oxycodone-Acetaminophen Nausea And Vomiting  . Propoxyphene N-Acetaminophen Nausea And Vomiting    Patient Measurements: Height: 5\' 5"  (165.1 cm) Weight: 176 lb 8 oz (80.1 kg) IBW/kg (Calculated) : 57  Vital Signs: Temp: 97.7 F (36.5 C) (01/21 0510) Temp Source: Oral (01/21 0510) BP: 144/58 (01/21 0510) Pulse Rate: 109 (01/21 1000)  Labs: Recent Labs    06/06/17 0822 06/07/17 0655  CREATININE 1.37* 1.24*    Estimated Creatinine Clearance: 39.7 mL/min (A) (by C-G formula based on SCr of 1.24 mg/dL (H)).  Assessment: Anticoag: heparin>Eliquis for new onset Afib, No anticoagulant PTA.  -76 yrs, 80 kg, Scr 1.24 down. No CBC since 1/16.  Goal of Therapy:  Therapeutic oral anticoagulation   Plan:  Continue Eliquis 5 mg PO BID   Gloria [name] S. Alford Highland, PharmD, BCPS Clinical Staff Pharmacist Pager [phone]  [place], [place] 06/08/2017,11:03 AM

## 2017-06-08 NOTE — Progress Notes (Signed)
PROGRESS NOTE    Gloria [name]  ZOX:[id number] DOB: 1940-10-17 DOA: 06/01/2017 PCP: Haywood Pao, MD    Brief Narrative: Patient is a 77 year old Caucasian female with past medical history significant of carotid disease; HTN; HLD; DM; and afib not on AC presenting with SOB/orthopnea. Patient was recently seen in ER for Atrial fibrillation but preferred to be discharged back home to follow up with her PCP and get cardiology referral.  Over the weekend, patient developed intermittent diaphoresis and orthopnea.  She was unable to lie flat last night and had to sit up in a chair. In the emergency room, patient was noted to have atrial fibrillation with RVR and suspected pulmonary edema. Patient was started on a Cardizem drip and given IV Lasix, clinically improving transitioned to oral Cardizem now and converted to sinus rhythm last night. 1/15 pm started Sotalol per Cards improved and this morning back in AFib with RVR   06/08/17 - Patient seen alongside patient's daughter. Patient and the daughter are not keen on being discharged back home. According to the patient and the daughter, the Cardiology team would prefer the patient to be monitored overnight. Patient is now on amiodarone as patient rhythm was back to Afib. Heart rate is still controlled. No chest pain or SOB. Hopefully, patient and the daughter will agree on discharge back home tomorrow.  Assessment & Plan:   Principal Problem:   Atrial fibrillation/atrial flutter with rapid ventricular response (HCC) - Remote history of same, was on Cardizem gtt on admission now off -Converted to sinus rhythm overnight, -started Sotalol per Cards, this am back in Afib with RVR -ECHO with normal EF and wall motion, grade 2DD, TSH normal -Cardiology following -Chads 2 score is > 3, started ELiquis today -monitor HR - Medications are currently on hold - S/P cardioversion today (06/07/17), and patient is back to NSR. - 06/08/17 - patient  reverted back to atrial fibrillation, but rate controlled - EP Team changed anti arrhythmic to Amiodarone - Hopefully, patient will agree on being discharged back home tomorrow.   Acute diastolic CHF/Pulm edema -improved with diuresis, appears euvolemic now -ECHO with normal EF and grade 2 DD -may need low dose oral lasix at discharge -urine output not recorded, weight improving - Patient is currently asymptomatic  DM -Hba1c is 7.5 -hold Glucophage, Januvia -continue moderate-scale SSI  HTN -Hold ACE and stopped metoprolol and started Sotalol for afib/RVR -stopped HCTZ  HLD -Continue Lipitor  AKI on CKD2 -improving with diuresis and stopping NSAIDs/ACE -baseline around 1.6 - SCr is 1.37   Suspected OSA -needs sleep study as OP  DVT prophylaxis: Eliquis Code Status:  Full Family Communication: Daughter at bedside Disposition Plan:  Home once clinically improved  Consultants:   Cardiology   Procedures: DC cardioversion  Antimicrobials:    Subjective: - No SOB or chest pain   Objective: Vitals:   06/07/17 2131 06/08/17 0510 06/08/17 1000 06/08/17 1429  BP: (!) 139/51 (!) 144/58 (!) 146/86 137/60  Pulse: 63 (!) 56 (!) 109 (!) 119  Resp:      Temp: 97.9 F (36.6 C) 97.7 F (36.5 C)  98.4 F (36.9 C)  TempSrc: Oral Oral  Oral  SpO2: 92% 91%    Weight:  80.1 kg (176 lb 8 oz)    Height:        Intake/Output Summary (Last 24 hours) at 06/08/2017 1918 Last data filed at 06/08/2017 1300 Gross per 24 hour  Intake 320 ml  Output 300 ml  Net 20 ml   Filed Weights   06/06/17 0459 06/07/17 0500 06/08/17 0510  Weight: 80.1 kg (176 lb 8 oz) 80.1 kg (176 lb 9.6 oz) 80.1 kg (176 lb 8 oz)    Examination:  Gen: Awake, Alert, Oriented X 3, no distress HEENT: no JVD Lungs: clear bilaterally CVS: S1S2, regular Abd: Obese, soft, Non tender,  BS present Extremities: No edema  Data Reviewed:   CBC: Recent Labs  Lab 06/02/17 0020 06/03/17 0600  WBC  7.9 10.6*  NEUTROABS 5.0  --   HGB 10.4* 11.3*  HCT 33.0* 37.0  MCV 89.7 90.0  PLT 316 086   Basic Metabolic Panel: Recent Labs  Lab 06/04/17 0846 06/05/17 0518 06/06/17 0822 06/07/17 0655 06/08/17 1023  NA 139 140 137 138 136  K 5.0 4.2 4.5 5.5* 4.0  CL 105 103 101 102 100*  CO2 24 26 25 24 25   GLUCOSE 141* 121* 213* 116* 239*  BUN 32* 29* 27* 26* 26*  CREATININE 2.01* 1.46* 1.37* 1.24* 1.21*  CALCIUM 8.8* 8.9 9.0 8.9 9.0   GFR: Estimated Creatinine Clearance: 40.7 mL/min (A) (by C-G formula based on SCr of 1.21 mg/dL (H)). Liver Function Tests: No results for input(s): AST, ALT, ALKPHOS, BILITOT, PROT, ALBUMIN in the last 168 hours. No results for input(s): LIPASE, AMYLASE in the last 168 hours. No results for input(s): AMMONIA in the last 168 hours. Coagulation Profile: No results for input(s): INR, PROTIME in the last 168 hours. Cardiac Enzymes: No results for input(s): CKTOTAL, CKMB, CKMBINDEX, TROPONINI in the last 168 hours. BNP (last 3 results) No results for input(s): PROBNP in the last 8760 hours. HbA1C: No results for input(s): HGBA1C in the last 72 hours. CBG: Recent Labs  Lab 06/07/17 1611 06/07/17 2124 06/08/17 0800 06/08/17 1119 06/08/17 1700  GLUCAP 125* 308* 134* 180* 159*   Lipid Profile: No results for input(s): CHOL, HDL, LDLCALC, TRIG, CHOLHDL, LDLDIRECT in the last 72 hours. Thyroid Function Tests: No results for input(s): TSH, T4TOTAL, FREET4, T3FREE, THYROIDAB in the last 72 hours. Anemia Panel: No results for input(s): VITAMINB12, FOLATE, FERRITIN, TIBC, IRON, RETICCTPCT in the last 72 hours. Urine analysis: No results found for: COLORURINE, APPEARANCEUR, LABSPEC, PHURINE, GLUCOSEU, HGBUR, BILIRUBINUR, KETONESUR, PROTEINUR, UROBILINOGEN, NITRITE, LEUKOCYTESUR Sepsis Labs: @LABRCNTIP (procalcitonin:4,lacticidven:4)  )No results found for this or any previous visit (from the past 240 hour(s)).    Radiology Studies: No results  found.  Scheduled Meds: . amiodarone  200 mg Oral BID  . apixaban  5 mg Oral BID  . aspirin EC  81 mg Oral Daily  . atorvastatin  80 mg Oral Daily  . fenofibrate  160 mg Oral Daily  . insulin aspart  0-15 Units Subcutaneous TID WC  . insulin glargine  34 Units Subcutaneous QHS  . metoprolol tartrate  12.5 mg Oral BID  . multivitamin  1 tablet Oral Daily  . pantoprazole  40 mg Oral Daily  . polyvinyl alcohol  1 drop Both Eyes Daily   Continuous Infusions: . sodium chloride       LOS: 7 days    Time spent: 55min    Dana Allan, MD [facility] Page via [email or web address], password [email or web address] After 7PM please contact night-coverage  06/08/2017, 7:18 PM

## 2017-06-08 NOTE — Progress Notes (Signed)
Progress Note   Subjective   Doing well today,  Maintaining sinus rhythm.  Inpatient Medications    Scheduled Meds: . apixaban  5 mg Oral BID  . aspirin EC  81 mg Oral Daily  . atorvastatin  80 mg Oral Daily  . fenofibrate  160 mg Oral Daily  . flecainide  100 mg Oral Q12H  . insulin aspart  0-15 Units Subcutaneous TID WC  . insulin glargine  34 Units Subcutaneous QHS  . metoprolol tartrate  12.5 mg Oral BID  . multivitamin  1 tablet Oral Daily  . pantoprazole  40 mg Oral Daily  . polyvinyl alcohol  1 drop Both Eyes Daily   Continuous Infusions: . sodium chloride     PRN Meds: sodium chloride, acetaminophen, diphenhydrAMINE, HYDROcodone-acetaminophen, ondansetron (ZOFRAN) IV   Vital Signs    Vitals:   06/07/17 0945 06/07/17 1613 06/07/17 2131 06/08/17 0510  BP:  131/65 (!) 139/51 (!) 144/58  Pulse:  60 63 (!) 56  Resp:  18    Temp: (!) 97.3 F (36.3 C) 98.3 F (36.8 C) 97.9 F (36.6 C) 97.7 F (36.5 C)  TempSrc:  Oral Oral Oral  SpO2:  98% 92% 91%  Weight:    176 lb 8 oz (80.1 kg)  Height:        Intake/Output Summary (Last 24 hours) at 06/08/2017 0739 Last data filed at 06/08/2017 0511 Gross per 24 hour  Intake 540 ml  Output 300 ml  Net 240 ml   Filed Weights   06/06/17 0459 06/07/17 0500 06/08/17 0510  Weight: 176 lb 8 oz (80.1 kg) 176 lb 9.6 oz (80.1 kg) 176 lb 8 oz (80.1 kg)    Telemetry    sinus- Personally Reviewed  Physical Exam   GEN- The patient is well appearing, alert and oriented x 3 today.   Head- normocephalic, atraumatic Eyes-  Sclera clear, conjunctiva pink Ears- hearing intact Oropharynx- clear Neck- supple, Lungs-  CTA b/l, normal work of breathing Heart-  RRR GI- soft, NT, ND Extremities- no clubbing, cyanosis, or edema  MS- no significant deformity or atrophy Skin- no rash or lesion Psych- euthymic mood, full affect Neuro- strength and sensation are intact   Labs    Chemistry Recent Labs  Lab 06/05/17 0518  06/06/17 0822 06/07/17 0655  NA 140 137 138  K 4.2 4.5 5.5*  CL 103 101 102  CO2 26 25 24   GLUCOSE 121* 213* 116*  BUN 29* 27* 26*  CREATININE 1.46* 1.37* 1.24*  CALCIUM 8.9 9.0 8.9  GFRNONAA 33* 36* 41*  GFRAA 39* 42* 47*  ANIONGAP 11 11 12      Hematology Recent Labs  Lab 06/01/17 1041 06/02/17 0020 06/03/17 0600  WBC 9.9 7.9 10.6*  RBC 3.95 3.68* 4.11  HGB 11.5* 10.4* 11.3*  HCT 36.0 33.0* 37.0  MCV 91.1 89.7 90.0  MCH 29.1 28.3 27.5  MCHC 31.9 31.5 30.5  RDW 15.0 15.1 15.2  PLT 381 316 333    Cardiac EnzymesNo results for input(s): TROPONINI in the last 168 hours.  Recent Labs  Lab 06/01/17 1052  TROPIPOC 0.00        Assessment & Plan    1.  Acute diastolic CHF Echo 9/93/71 was reviewed by Dr. Rayann Heman Clinically improving No out put charted Weight is down a few po  2. Bradycardia Stable, asymptomatic No change required today  3. Paroxysmal atrial fibrillation/ atrial flutter Remains in SR this AM This AM EKG is reviewed with  Dr. Rayann Heman, intervals look OK Continue Flecainide/metoprolol  4. Hyperkalemia Per primary team   OK to discharge from EP standpoint, AFib clinic follow up will be arranged   Tommye Standard, PA-C 06/08/2017 7:39 AM   I have seen, examined the patient, and reviewed the above assessment and plan.  Changes to above are made where necessary.  On exam, RRR.  Doing well at this time.  Would DC to home today on current medicines.  Primary team to follow-up elevated K from yesterday.  We will arrange outpatient follow-up in the AF clinic.  Electrophysiology team to see as needed while here. Please call with questions.   Co Sign: Thompson Grayer, MD 06/08/2017 8:07 AM

## 2017-06-09 DIAGNOSIS — I481 Persistent atrial fibrillation: Principal | ICD-10-CM

## 2017-06-09 LAB — GLUCOSE, CAPILLARY: Glucose-Capillary: 146 mg/dL — ABNORMAL HIGH (ref 65–99)

## 2017-06-09 MED ORDER — APIXABAN 5 MG PO TABS: 5.0000 mg | ORAL_TABLET | Freq: Two times a day (BID) | ORAL | 0 refills | Status: DC

## 2017-06-09 MED ORDER — AMIODARONE HCL 200 MG PO TABS: 200.0000 mg | ORAL_TABLET | Freq: Two times a day (BID) | ORAL | 0 refills | Status: DC

## 2017-06-09 MED ORDER — ASPIRIN 81 MG PO TBEC: 81.0000 mg | DELAYED_RELEASE_TABLET | Freq: Every day | ORAL | 0 refills | Status: DC

## 2017-06-09 MED ORDER — METOPROLOL TARTRATE 25 MG PO TABS: 12.5000 mg | ORAL_TABLET | Freq: Two times a day (BID) | ORAL | 0 refills | Status: DC

## 2017-06-09 NOTE — Discharge Summary (Signed)
Physician Discharge Summary  Patient ID: Gloria [name] MRN: [id number] DOB/AGE: Jul 18, 1940 77 y.o.  Admit date: 06/01/2017 Discharge date: 06/09/2017  Admission Diagnoses:   Discharge Diagnoses:  Principal Problem:   Atrial fibrillation with rapid ventricular response (HCC)   Acute on chronic diastolic CHF Active Problems:   Diabetes mellitus type 2 in obese (HCC)   Hyperlipidemia   Essential hypertension   Congestive heart failure (CHF) (HCC)   Acute kidey injury on CKD (chronic kidney disease) stage 3, GFR 30-59 ml/min (HCC)   Discharged Condition: stable  Hospital Course: Patient is a 77 year old [place] past medical history significant ofcarotid disease; HTN; HLD; DM; and afib not on AC presenting with SOB/orthopnea.Patient was recently seen in ER for Atrial fibrillation but preferred to be discharged back home to follow up with her PCP and get cardiology referral. Over the weekend, patient developedintermittent diaphoresis andorthopnea. She was unable to lie flat the night prior to admission and had to sit up in a chair. In the emergency room, patient was noted to be in atrial fibrillation with RVR and suspected pulmonary edema. Patient was started on Cardizem drip and given IV Lasix. Patient improved, and was to oral Cardizem. Patient kept going in and out of atrial fibrillation. Patient's cardiac issues were initially directed by General Cardiology team, but Electrophysiology was eventually consulted. Patient was cardioverted by the EP (Electrophysiology team) but patient could not maintain normal sinus rhythm. Patient failed medical therapy with sotalol and flecainide on this admission, and just started amiodarone a day prior to discharge. Patient still goes in and out of atrial fibrillation but the heart rate is controlled. Patient is currently on Eliquis. CHF symptoms have resolved. Patient will be discharged back home to follow up with the atrial  fibrillation clinic in the next 3-5 days.  Consults: cardiology and Electrophysiology  Significant Diagnostic Studies: ECHO - Normal LV size with mild LV hypertrophy. EF 55%. Moderate   diastolic dysfunction. Normal RV size and systolic function. No significant valvular abnormalities. Biatrial enlargement. Mild-moderate pulmonary hypertension.  Treatments: See above  Discharge Exam: Blood pressure 139/88, pulse 99, temperature 98 F (36.7 C), temperature source Oral, resp. rate 20, height 5\' 5"  (1.651 m), weight 77.9 kg (171 lb 12.8 oz), SpO2 92 %.   Disposition: 01-Home or Self Care  Discharge Instructions    Call MD for:   Complete by:  As directed    Call MD for worsening of symptoms   Diet - low sodium heart healthy   Complete by:  As directed    Diet Carb Modified   Complete by:  As directed    Increase activity slowly   Complete by:  As directed      Allergies as of 06/09/2017      Reactions   Iodinated Diagnostic Agents Anaphylaxis   Hibiclens [chlorhexidine Gluconate]    Iohexol     Desc: ANGIO EDEMA W/ ANAPHYLAXIS PRIOR... HIVES POST 13 HR PREP...OK/A.C.   Levaquin [levofloxacin In D5w]    Oxycodone-acetaminophen Nausea And Vomiting   Propoxyphene N-acetaminophen Nausea And Vomiting      Medication List    STOP taking these medications   aspirin 325 MG tablet Replaced by:  aspirin 81 MG EC tablet   hydrochlorothiazide 25 MG tablet Commonly known as:  HYDRODIURIL   losartan 50 MG tablet Commonly known as:  COZAAR   meloxicam 15 MG tablet Commonly known as:  MOBIC   naproxen sodium 220 MG tablet Commonly known as:  ALEVE  traMADol 50 MG tablet Commonly known as:  ULTRAM   TYLENOL PO     TAKE these medications   amiodarone 200 MG tablet Commonly known as:  PACERONE Take 1 tablet (200 mg total) by mouth 2 (two) times daily.   apixaban 5 MG Tabs tablet Commonly known as:  ELIQUIS Take 1 tablet (5 mg total) by mouth 2 (two) times daily.    aspirin 81 MG EC tablet Take 1 tablet (81 mg total) by mouth daily. Start taking on:  06/10/2017 Replaces:  aspirin 325 MG tablet   atorvastatin 80 MG tablet Commonly known as:  LIPITOR Take 80 mg by mouth daily.   BIOFREEZE EX Apply 1 application topically as needed (Back and Knee pain).   carboxymethylcellulose 0.5 % Soln Commonly known as:  REFRESH PLUS Place 1 drop into both eyes daily.   diphenhydrAMINE 25 MG tablet Commonly known as:  BENADRYL Take 25 mg by mouth every 6 (six) hours as needed for allergies.   fenofibrate 160 MG tablet Take 160 mg by mouth daily.   HYDROcodone-acetaminophen 5-325 MG tablet Commonly known as:  NORCO/VICODIN Take 1-2 tablets by mouth every 4 (four) hours as needed for pain.   insulin glargine 100 UNIT/ML injection Commonly known as:  LANTUS Inject 34 Units into the skin at bedtime.   JANUVIA 100 MG tablet Generic drug:  sitaGLIPtin Take 100 mg by mouth daily.   lidocaine 5 % Commonly known as:  LIDODERM Place 1 patch onto the skin as needed (Back Pain). Remove & Discard patch within 12 hours or as directed by MD   metFORMIN 500 MG tablet Commonly known as:  GLUCOPHAGE Take 500 mg by mouth See admin instructions. Patient takes 500mg  in the morning and 1000mg  in the evening   metoprolol tartrate 25 MG tablet Commonly known as:  LOPRESSOR Take 0.5 tablets (12.5 mg total) by mouth 2 (two) times daily. What changed:    medication strength  how much to take   omeprazole 20 MG capsule Commonly known as:  PRILOSEC Take 20 mg by mouth daily.   PRESERVISION AREDS 2 PO Take 1 tablet by mouth 2 (two) times daily.      Follow-up Information    [facility] Follow up on 06/12/2017.   Specialty:  Cardiology Why:  1:30PM Contact information: [address] [id number] mc [address] [place] [address] [phone]       Fay Records, MD Follow up on 07/13/2017.   Specialty:   Cardiology Why:  8:40AM Contact information: [address] [place] [address] [place] [address] [phone]           Signed: Bonnell Public 06/09/2017, 11:28 AM

## 2017-06-09 NOTE — Progress Notes (Signed)
Progress Note   Subjective   Doing well today, the patient denies CP or SOB.  No new concerns  Inpatient Medications    Scheduled Meds: . amiodarone  200 mg Oral BID  . apixaban  5 mg Oral BID  . aspirin EC  81 mg Oral Daily  . atorvastatin  80 mg Oral Daily  . fenofibrate  160 mg Oral Daily  . insulin aspart  0-15 Units Subcutaneous TID WC  . insulin glargine  34 Units Subcutaneous QHS  . metoprolol tartrate  12.5 mg Oral BID  . multivitamin  1 tablet Oral Daily  . pantoprazole  40 mg Oral Daily  . polyvinyl alcohol  1 drop Both Eyes Daily   Continuous Infusions: . sodium chloride     PRN Meds: sodium chloride, acetaminophen, diphenhydrAMINE, HYDROcodone-acetaminophen, ondansetron (ZOFRAN) IV   Vital Signs    Vitals:   06/08/17 1000 06/08/17 1429 06/08/17 2056 06/09/17 0514  BP: (!) 146/86 137/60 (!) 145/68 136/74  Pulse: (!) 109 (!) 119 (!) 103 94  Resp:   20   Temp:  98.4 F (36.9 C) 98.5 F (36.9 C) 98 F (36.7 C)  TempSrc:  Oral Oral Oral  SpO2:   90% 92%  Weight:    171 lb 12.8 oz (77.9 kg)  Height:        Intake/Output Summary (Last 24 hours) at 06/09/2017 0818 Last data filed at 06/08/2017 2150 Gross per 24 hour  Intake 360 ml  Output -  Net 360 ml   Filed Weights   06/07/17 0500 06/08/17 0510 06/09/17 0514  Weight: 176 lb 9.6 oz (80.1 kg) 176 lb 8 oz (80.1 kg) 171 lb 12.8 oz (77.9 kg)    Telemetry    Afib, V rates 110s - Personally Reviewed  Physical Exam   GEN- The patient is well appearing, alert and oriented x 3 today.   Head- normocephalic, atraumatic Eyes-  Sclera clear, conjunctiva pink Ears- hearing intact Oropharynx- clear Neck- supple, Lungs- Clear to ausculation bilaterally, normal work of breathing Heart- irregular rate and rhythm  GI- soft, NT, ND, + BS Extremities- no clubbing, cyanosis, or edema  MS- no significant deformity or atrophy Skin- no rash or lesion Psych- euthymic mood, full affect Neuro- strength and  sensation are intact   Labs    Chemistry Recent Labs  Lab 06/06/17 0822 06/07/17 0655 06/08/17 1023  NA 137 138 136  K 4.5 5.5* 4.0  CL 101 102 100*  CO2 25 24 25   GLUCOSE 213* 116* 239*  BUN 27* 26* 26*  CREATININE 1.37* 1.24* 1.21*  CALCIUM 9.0 8.9 9.0  GFRNONAA 36* 41* 42*  GFRAA 42* 47* 49*  ANIONGAP 11 12 11      Hematology Recent Labs  Lab 06/03/17 0600  WBC 10.6*  RBC 4.11  HGB 11.3*  HCT 37.0  MCV 90.0  MCH 27.5  MCHC 30.5  RDW 15.2  PLT 333    Cardiac EnzymesNo results for input(s): TROPONINI in the last 168 hours. No results for input(s): TROPIPOC in the last 168 hours.      Assessment & Plan    1.  Acute diastolic dysfunction Improving No changes  2. Persistent afib Difficult to control She has failed medical therapy with sotalol and flecainide this admission. Now on amiodarone Would discharge on amiodarone 200mg  BID at this time.  Bring back in 4 weeks for repeat cardioversion if still in AF Follow-up in AF clinic within 3-5 days (we will arrange) Continue  anticoagulation  OK to discharge from EP standpoint with close outpatient AF clinic follow-up  Thompson Grayer MD, [facility] 06/09/2017 8:18 AM

## 2017-06-09 NOTE — Progress Notes (Signed)
Patient received discharge information and acknowledged understanding of it. Patient IV was removed.  

## 2017-06-12 ENCOUNTER — Ambulatory Visit (HOSPITAL_COMMUNITY)
Admit: 2017-06-12 | Discharge: 2017-06-12 | Disposition: A | Discharge status: Home / Self Care | Payer: Medicare Other | Source: Ambulatory Visit | Attending: Nurse Practitioner | Admitting: Nurse Practitioner

## 2017-06-12 ENCOUNTER — Telehealth: Payer: Self-pay | Admitting: *Deleted

## 2017-06-12 ENCOUNTER — Encounter (HOSPITAL_COMMUNITY): Payer: Self-pay | Admitting: Nurse Practitioner

## 2017-06-12 VITALS — BP 118/66 | HR 104 | Ht 65.0 in | Wt 174.0 lb

## 2017-06-12 DIAGNOSIS — Z87891 Personal history of nicotine dependence: Secondary | ICD-10-CM | POA: Insufficient documentation

## 2017-06-12 DIAGNOSIS — Z8701 Personal history of pneumonia (recurrent): Secondary | ICD-10-CM | POA: Diagnosis not present

## 2017-06-12 DIAGNOSIS — Z8249 Family history of ischemic heart disease and other diseases of the circulatory system: Secondary | ICD-10-CM | POA: Diagnosis not present

## 2017-06-12 DIAGNOSIS — Z794 Long term (current) use of insulin: Secondary | ICD-10-CM | POA: Diagnosis not present

## 2017-06-12 DIAGNOSIS — E1151 Type 2 diabetes mellitus with diabetic peripheral angiopathy without gangrene: Secondary | ICD-10-CM | POA: Insufficient documentation

## 2017-06-12 DIAGNOSIS — Z7982 Long term (current) use of aspirin: Secondary | ICD-10-CM | POA: Diagnosis not present

## 2017-06-12 DIAGNOSIS — G4733 Obstructive sleep apnea (adult) (pediatric): Secondary | ICD-10-CM

## 2017-06-12 DIAGNOSIS — I10 Essential (primary) hypertension: Secondary | ICD-10-CM | POA: Insufficient documentation

## 2017-06-12 DIAGNOSIS — E11319 Type 2 diabetes mellitus with unspecified diabetic retinopathy without macular edema: Secondary | ICD-10-CM | POA: Diagnosis not present

## 2017-06-12 DIAGNOSIS — Z7901 Long term (current) use of anticoagulants: Secondary | ICD-10-CM | POA: Diagnosis not present

## 2017-06-12 DIAGNOSIS — H3530 Unspecified macular degeneration: Secondary | ICD-10-CM | POA: Diagnosis not present

## 2017-06-12 DIAGNOSIS — Z885 Allergy status to narcotic agent status: Secondary | ICD-10-CM | POA: Diagnosis not present

## 2017-06-12 DIAGNOSIS — Z803 Family history of malignant neoplasm of breast: Secondary | ICD-10-CM | POA: Diagnosis not present

## 2017-06-12 DIAGNOSIS — M1990 Unspecified osteoarthritis, unspecified site: Secondary | ICD-10-CM | POA: Diagnosis not present

## 2017-06-12 DIAGNOSIS — Z888 Allergy status to other drugs, medicaments and biological substances status: Secondary | ICD-10-CM | POA: Insufficient documentation

## 2017-06-12 DIAGNOSIS — Z79899 Other long term (current) drug therapy: Secondary | ICD-10-CM | POA: Insufficient documentation

## 2017-06-12 DIAGNOSIS — K219 Gastro-esophageal reflux disease without esophagitis: Secondary | ICD-10-CM | POA: Insufficient documentation

## 2017-06-12 DIAGNOSIS — I481 Persistent atrial fibrillation: Secondary | ICD-10-CM | POA: Diagnosis present

## 2017-06-12 DIAGNOSIS — I4819 Other persistent atrial fibrillation: Secondary | ICD-10-CM

## 2017-06-12 DIAGNOSIS — Z881 Allergy status to other antibiotic agents status: Secondary | ICD-10-CM | POA: Diagnosis not present

## 2017-06-12 DIAGNOSIS — E785 Hyperlipidemia, unspecified: Secondary | ICD-10-CM | POA: Diagnosis not present

## 2017-06-12 DIAGNOSIS — Z9049 Acquired absence of other specified parts of digestive tract: Secondary | ICD-10-CM | POA: Diagnosis not present

## 2017-06-12 DIAGNOSIS — Z91041 Radiographic dye allergy status: Secondary | ICD-10-CM | POA: Diagnosis not present

## 2017-06-12 NOTE — Progress Notes (Signed)
Primary Care Physician: Tisovec, Fransico Him, MD Referring Physician: [facility] ER f/u   Gloria [name] is a 77 y.o. female with a h/o carotid disease; HTN; HLD; DM; and afib not on AC presenting with SOB/orthopnea.Patient was recently seen in ER for Atrial fibrillation but preferred to be discharged back home to follow up with her PCP and get cardiology referral. Over the weekend, patient developedintermittent diaphoresis andorthopnea. She was unable to lie flat the night prior to admission and had to sit up in a chair. In the emergency room, patient was noted to be in atrial fibrillation with RVR and suspected pulmonary edema. Patient was started on Cardizem drip and given IV Lasix. Patient improved, and was to oral Cardizem. Patient kept going in and out of atrial fibrillation. Patient's cardiac issues were initially directed by General Cardiology team, but Electrophysiology was eventually consulted.  Patient was cardioverted by the EP (Electrophysiology team) but patient could not maintain normal sinus rhythm. Patient failed medical therapy with sotalol and flecainide on this admission, and just started amiodarone a day prior to discharge. Patient still goes in and out of atrial fibrillation but the heart rate was controlled. Patient placed on Eliquis. CHF symptoms have resolved. Patient  discharged   home to follow up with the atrial fibrillation clinic in the 3-5 days.  F/u in afib clinic, 1/25. Pt is currently still loading on amiodarone 200 mg bid. She remains in afib at 104 bpm. She feels improved. Weight is down another 3 lbs from hospital. No alcohol, tobacco, snoring observed by daughter. No previous sleep study. Sedentary, overweight.  Today, she denies symptoms of palpitations, chest pain, shortness of breath, orthopnea, PND, lower extremity edema, dizziness, presyncope, syncope, or neurologic sequela. The patient is tolerating medications without difficulties and is otherwise without  complaint today.   Past Medical History:  Diagnosis Date  . Arthritis   . Atrial fibrillation ([place])   . Carotid artery occlusion   . Diabetes mellitus   . Diabetic retinopathy ([place])   . GERD (gastroesophageal reflux disease)   . Hyperlipidemia   . Hypertension   . Macular degeneration    Right eye only  . Peripheral vascular disease (HCC)    hx rt carotid s/p  . Pneumonia    hx  . PONV (postoperative nausea and vomiting)    Past Surgical History:  Procedure Laterality Date  . CARDIOVERSION N/A 06/07/2017   Procedure: CARDIOVERSION;  Surgeon: Thompson Grayer, MD;  Location: [place];  Service: Cardiovascular;  Laterality: N/A;  . CAROTID ENDARTERECTOMY  Jan. 19, 2007   Right  cea  . CHOLECYSTECTOMY  1968  . LUMBAR LAMINECTOMY/DECOMPRESSION MICRODISCECTOMY  02/27/2012   Procedure: LUMBAR LAMINECTOMY/DECOMPRESSION MICRODISCECTOMY 2 LEVELS;  Surgeon: Kristeen Miss, MD;  Location: [place] [facility]UR[facility];  Service: Neurosurgery;  Laterality: N/A;  Lumbar two-three, four-five Laminectomy/foraminotomy  . SPINAL CORD DECOMPRESSION  Oct. 2013    Current Outpatient Medications  Medication Sig Dispense Refill  . amiodarone (PACERONE) 200 MG tablet Take 1 tablet (200 mg total) by mouth 2 (two) times daily. 60 tablet 0  . apixaban (ELIQUIS) 5 MG TABS tablet Take 1 tablet (5 mg total) by mouth 2 (two) times daily. 60 tablet 0  . aspirin EC 81 MG EC tablet Take 1 tablet (81 mg total) by mouth daily. 30 tablet 0  . atorvastatin (LIPITOR) 80 MG tablet Take 80 mg by mouth daily.    . carboxymethylcellulose (REFRESH PLUS) 0.5 % SOLN Place 1 drop into both eyes daily.    Marland Kitchen  diphenhydrAMINE (BENADRYL) 25 MG tablet Take 25 mg by mouth every 6 (six) hours as needed for allergies.    . fenofibrate 160 MG tablet Take 160 mg by mouth daily.    Marland Kitchen HYDROcodone-acetaminophen (NORCO/VICODIN) 5-325 MG per tablet Take 1-2 tablets by mouth every 4 (four) hours as needed for pain. 60 tablet 0  . insulin glargine (LANTUS) 100  UNIT/ML injection Inject 34 Units into the skin at bedtime.     Marland Kitchen JANUVIA 100 MG tablet Take 100 mg by mouth daily.  1  . lidocaine (LIDODERM) 5 % Place 1 patch onto the skin as needed (Back Pain). Remove & Discard patch within 12 hours or as directed by MD    . Menthol, Topical Analgesic, (BIOFREEZE EX) Apply 1 application topically as needed (Back and Knee pain).    . metFORMIN (GLUCOPHAGE) 500 MG tablet Take 500 mg by mouth See admin instructions. Patient takes 500mg  in the morning and 1000mg  in the evening    . metoprolol tartrate (LOPRESSOR) 25 MG tablet Take 0.5 tablets (12.5 mg total) by mouth 2 (two) times daily. 60 tablet 0  . Multiple Vitamins-Minerals (PRESERVISION AREDS 2 PO) Take 1 tablet by mouth 2 (two) times daily.    Marland Kitchen omeprazole (PRILOSEC) 20 MG capsule Take 20 mg by mouth daily.     No current facility-administered medications for this encounter.     Allergies  Allergen Reactions  . Iodinated Diagnostic Agents Anaphylaxis  . Hibiclens [Chlorhexidine Gluconate]   . Iohexol      Desc: ANGIO EDEMA W/ ANAPHYLAXIS PRIOR... HIVES POST 13 HR PREP...OK/A.C.   . Levaquin [Levofloxacin In D5w]   . Oxycodone-Acetaminophen Nausea And Vomiting  . Propoxyphene N-Acetaminophen Nausea And Vomiting    Social History   Socioeconomic History  . Marital status: Divorced    Spouse name: Not on file  . Number of children: Not on file  . Years of education: Not on file  . Highest education level: Not on file  Social Needs  . Financial resource strain: Not on file  . Food insecurity - worry: Not on file  . Food insecurity - inability: Not on file  . Transportation needs - medical: Not on file  . Transportation needs - non-medical: Not on file  Occupational History  . Occupation: Critical care nurse  Tobacco Use  . Smoking status: Former Smoker    Packs/day: 0.25    Years: 32.00    Pack years: 8.00    Types: Cigarettes    Last attempt to quit: 02/20/2004    Years since  quitting: 13.3  . Smokeless tobacco: Never Used  Substance and Sexual Activity  . Alcohol use: No  . Drug use: No  . Sexual activity: Yes    Birth control/protection: Post-menopausal  Other Topics Concern  . Not on file  Social History Narrative  . Not on file    Family History  Problem Relation Age of Onset  . Heart disease Mother        Heart Disease before age 42  . Heart disease Father   . Heart disease Sister        Heart Disease before age 69  . Breast cancer Daughter 76    ROS- All systems are reviewed and negative except as per the HPI above  Physical Exam: There were no vitals filed for this visit. Wt Readings from Last 3 Encounters:  06/09/17 171 lb 12.8 oz (77.9 kg)  05/28/17 173 lb (78.5 kg)  08/04/16 182  lb 14.4 oz (83 kg)    Labs: Lab Results  Component Value Date   NA 136 06/08/2017   K 4.0 06/08/2017   CL 100 (L) 06/08/2017   CO2 25 06/08/2017   GLUCOSE 239 (H) 06/08/2017   BUN 26 (H) 06/08/2017   CREATININE 1.21 (H) 06/08/2017   CALCIUM 9.0 06/08/2017   No results found for: INR Lab Results  Component Value Date   CHOL 113 06/02/2017   HDL 14 (L) 06/02/2017   LDLCALC 63 06/02/2017   TRIG 180 (H) 06/02/2017     GEN- The patient is well appearing, alert and oriented x 3 today.   Head- normocephalic, atraumatic Eyes-  Sclera clear, conjunctiva pink Ears- hearing intact Oropharynx- clear Neck- supple, no JVP Lymph- no cervical lymphadenopathy Lungs- Clear to ausculation bilaterally, normal work of breathing Heart- irregular rate and rhythm, no murmurs, rubs or gallops, PMI not laterally displaced GI- soft, NT, ND, + BS Extremities- no clubbing, cyanosis, or edema MS- no significant deformity or atrophy Skin- no rash or lesion Psych- euthymic mood, full affect Neuro- strength and sensation are intact  EKG-Afib at 104 bpm, qrs int 84 ms, qtc 470 ms Epic records reviewed    Assessment and Plan: 1. New onset persistent  afib General  education re afib Failed cardioversion/sotalol and flecainide Now loading on amiodarone 200 mg bid In afib at 104 bpm Will increase metoprolol to 25 mg bid for better rate control Sleep study for snoring  2. Chadsvasc score of at least 6 Bleeding precautions discussed Continue eliquis at 5 mg bid  3. HTN Stable  A lot of her antihypertensives stopped for low BP in hospital  F/u next week for further evaluation  Butch Penny C. Shoaib Siefker, [facility] [address] [place], [place] [address] [phone]

## 2017-06-12 NOTE — Telephone Encounter (Signed)
-----   Message from Iona Hansen, [place] sent at 06/12/2017  2:12 PM EST ----- Regarding: Sleep study Butch Penny would like this patient scheduled for a sleep study.  Thank you  Estill Bamberg

## 2017-06-12 NOTE — Telephone Encounter (Signed)
Sent to sleep pool. 

## 2017-06-12 NOTE — Patient Instructions (Signed)
Increase metoprolol to 1 full tablet twice a day.    You have been referred for a sleep study.  A representative from [facility] will call you

## 2017-06-15 ENCOUNTER — Ambulatory Visit (HOSPITAL_COMMUNITY): Payer: Medicare Other | Admitting: Nurse Practitioner

## 2017-06-15 ENCOUNTER — Telehealth (HOSPITAL_COMMUNITY): Payer: Self-pay | Admitting: *Deleted

## 2017-06-15 NOTE — Telephone Encounter (Signed)
Pt cld reporting stiff neck from sleeping wrong and wanted to know with her amiodarone and Eliquis, would it be okay to cyclobenzaprine 5 mg.  Per pharmacist Vernard Gambles this is okay to take and should have not interaction.  Pt notified

## 2017-06-17 ENCOUNTER — Ambulatory Visit (HOSPITAL_COMMUNITY)
Admission: RE | Admit: 2017-06-17 | Discharge: 2017-06-17 | Disposition: A | Discharge status: Home / Self Care | Payer: Medicare Other | Source: Ambulatory Visit | Attending: Nurse Practitioner | Admitting: Nurse Practitioner

## 2017-06-17 DIAGNOSIS — Z79899 Other long term (current) drug therapy: Secondary | ICD-10-CM | POA: Diagnosis not present

## 2017-06-17 DIAGNOSIS — R9431 Abnormal electrocardiogram [ECG] [EKG]: Secondary | ICD-10-CM | POA: Diagnosis not present

## 2017-06-17 DIAGNOSIS — I4891 Unspecified atrial fibrillation: Secondary | ICD-10-CM | POA: Insufficient documentation

## 2017-06-17 NOTE — Progress Notes (Addendum)
Pt in for EKG/BP check.  To be reviewed by Albertina Parr  Pt is loading on amiodarone 200 mg bid. EKG today shows that she has returned to SR at 61 bpm, with pr int at 222 bpm, qrs int 76 ms, qtc 459 ms.   She will continue loading on amiodarone 200 mg bid and on f/u with Dr. Harrington Challenger, 2/25, dose will probably be reduced to 200 mg daily at that time.

## 2017-06-25 ENCOUNTER — Ambulatory Visit (HOSPITAL_COMMUNITY)
Admission: RE | Admit: 2017-06-25 | Discharge: 2017-06-25 | Disposition: A | Discharge status: Home / Self Care | Payer: Medicare Other | Source: Ambulatory Visit | Attending: Nurse Practitioner | Admitting: Nurse Practitioner

## 2017-06-25 ENCOUNTER — Encounter (HOSPITAL_COMMUNITY): Payer: Self-pay | Admitting: Nurse Practitioner

## 2017-06-25 VITALS — BP 144/76 | HR 68 | Ht 65.0 in | Wt 170.6 lb

## 2017-06-25 DIAGNOSIS — Z87891 Personal history of nicotine dependence: Secondary | ICD-10-CM | POA: Diagnosis not present

## 2017-06-25 DIAGNOSIS — I119 Hypertensive heart disease without heart failure: Secondary | ICD-10-CM | POA: Diagnosis not present

## 2017-06-25 DIAGNOSIS — E11319 Type 2 diabetes mellitus with unspecified diabetic retinopathy without macular edema: Secondary | ICD-10-CM | POA: Diagnosis not present

## 2017-06-25 DIAGNOSIS — I481 Persistent atrial fibrillation: Secondary | ICD-10-CM | POA: Insufficient documentation

## 2017-06-25 DIAGNOSIS — Z7901 Long term (current) use of anticoagulants: Secondary | ICD-10-CM | POA: Diagnosis not present

## 2017-06-25 DIAGNOSIS — Z794 Long term (current) use of insulin: Secondary | ICD-10-CM | POA: Diagnosis not present

## 2017-06-25 DIAGNOSIS — Z9049 Acquired absence of other specified parts of digestive tract: Secondary | ICD-10-CM | POA: Diagnosis not present

## 2017-06-25 DIAGNOSIS — Z7982 Long term (current) use of aspirin: Secondary | ICD-10-CM | POA: Diagnosis not present

## 2017-06-25 DIAGNOSIS — E1151 Type 2 diabetes mellitus with diabetic peripheral angiopathy without gangrene: Secondary | ICD-10-CM | POA: Diagnosis not present

## 2017-06-25 DIAGNOSIS — I6529 Occlusion and stenosis of unspecified carotid artery: Secondary | ICD-10-CM | POA: Insufficient documentation

## 2017-06-25 DIAGNOSIS — E785 Hyperlipidemia, unspecified: Secondary | ICD-10-CM | POA: Diagnosis not present

## 2017-06-25 DIAGNOSIS — Z79899 Other long term (current) drug therapy: Secondary | ICD-10-CM | POA: Insufficient documentation

## 2017-06-25 DIAGNOSIS — K219 Gastro-esophageal reflux disease without esophagitis: Secondary | ICD-10-CM | POA: Diagnosis not present

## 2017-06-25 DIAGNOSIS — I4819 Other persistent atrial fibrillation: Secondary | ICD-10-CM

## 2017-06-25 DIAGNOSIS — I4891 Unspecified atrial fibrillation: Secondary | ICD-10-CM | POA: Diagnosis present

## 2017-06-25 DIAGNOSIS — Z881 Allergy status to other antibiotic agents status: Secondary | ICD-10-CM | POA: Insufficient documentation

## 2017-06-25 NOTE — Progress Notes (Signed)
Primary Care Physician: Tisovec, Fransico Him, MD Referring Physician: [facility] ER f/u   Gloria [name] is a 77 y.o. female with a h/o carotid disease; HTN; HLD; DM; and afib not on AC presenting with SOB/orthopnea.Patient was recently seen in ER for Atrial fibrillation but preferred to be discharged back home to follow up with her PCP and get cardiology referral. Over the weekend, patient developedintermittent diaphoresis andorthopnea. She was unable to lie flat the night prior to admission and had to sit up in a chair. In the emergency room, patient was noted to be in atrial fibrillation with RVR and suspected pulmonary edema. Patient was started on Cardizem drip and given IV Lasix. Patient improved, and was to oral Cardizem. Patient kept going in and out of atrial fibrillation. Patient's cardiac issues were initially directed by General Cardiology team, but Electrophysiology was eventually consulted.  Patient was cardioverted by the EP (Electrophysiology team) but patient could not maintain normal sinus rhythm. Patient failed medical therapy with sotalol and flecainide on this admission, and just started amiodarone a day prior to discharge. Patient still goes in and out of atrial fibrillation but the heart rate was controlled. Patient placed on Eliquis. CHF symptoms have resolved. Patient  discharged   home to follow up with the atrial fibrillation clinic in 3-5 days.  F/u in afib clinic, 1/25. Pt is currently still loading on amiodarone 200 mg bid. She remains in afib at 104 bpm. She feels improved. Weight is down another 3 lbs from hospital. No alcohol, tobacco, snoring observed by daughter. No previous sleep study. Sedentary, overweight.  Ekg shows SR when returned to office, 1/30, for EKG.  Seen back in Afib clinic 2/7, for pt having one day of afib where she did not feel well at all. She is in SR today but has had a few episodes of afib that the daughter is concerned about that she can  tell the pt does not feel as well.She continues to load on 200 mg bid, she has noted some mild shakes of her hands and being slightly off balance. These symptoms may be from loading dose of amiodarone and often will improve when the dose is reduced to 200 mg a day.  Today, she denies symptoms of palpitations, chest pain, shortness of breath, orthopnea, PND, lower extremity edema, dizziness, presyncope, syncope, or neurologic sequela. The patient is tolerating medications without difficulties and is otherwise without complaint today.   Past Medical History:  Diagnosis Date  . Arthritis   . Atrial fibrillation ([place])   . Carotid artery occlusion   . Diabetes mellitus   . Diabetic retinopathy ([place])   . GERD (gastroesophageal reflux disease)   . Hyperlipidemia   . Hypertension   . Macular degeneration    Right eye only  . Peripheral vascular disease (HCC)    hx rt carotid s/p  . Pneumonia    hx  . PONV (postoperative nausea and vomiting)    Past Surgical History:  Procedure Laterality Date  . CARDIOVERSION N/A 06/07/2017   Procedure: CARDIOVERSION;  Surgeon: Thompson Grayer, MD;  Location: [place];  Service: Cardiovascular;  Laterality: N/A;  . CAROTID ENDARTERECTOMY  Jan. 19, 2007   Right  cea  . CHOLECYSTECTOMY  1968  . LUMBAR LAMINECTOMY/DECOMPRESSION MICRODISCECTOMY  02/27/2012   Procedure: LUMBAR LAMINECTOMY/DECOMPRESSION MICRODISCECTOMY 2 LEVELS;  Surgeon: Kristeen Miss, MD;  Location: [place] NEURO ORS;  Service: Neurosurgery;  Laterality: N/A;  Lumbar two-three, four-five Laminectomy/foraminotomy  . SPINAL CORD DECOMPRESSION  Oct. 2013  Current Outpatient Medications  Medication Sig Dispense Refill  . amiodarone (PACERONE) 200 MG tablet Take 1 tablet (200 mg total) by mouth 2 (two) times daily. 60 tablet 0  . apixaban (ELIQUIS) 5 MG TABS tablet Take 1 tablet (5 mg total) by mouth 2 (two) times daily. 60 tablet 0  . aspirin EC 81 MG EC tablet Take 1 tablet (81 mg total) by mouth daily.  30 tablet 0  . atorvastatin (LIPITOR) 80 MG tablet Take 80 mg by mouth daily.    . carboxymethylcellulose (REFRESH PLUS) 0.5 % SOLN Place 1 drop into both eyes daily.    . diphenhydrAMINE (BENADRYL) 25 MG tablet Take 25 mg by mouth every 6 (six) hours as needed for allergies.    . fenofibrate 160 MG tablet Take 160 mg by mouth daily.    Marland Kitchen HYDROcodone-acetaminophen (NORCO/VICODIN) 5-325 MG per tablet Take 1-2 tablets by mouth every 4 (four) hours as needed for pain. 60 tablet 0  . insulin glargine (LANTUS) 100 UNIT/ML injection Inject 34 Units into the skin at bedtime.     Marland Kitchen JANUVIA 100 MG tablet Take 100 mg by mouth daily.  1  . lidocaine (LIDODERM) 5 % Place 1 patch onto the skin as needed (Back Pain). Remove & Discard patch within 12 hours or as directed by MD    . Menthol, Topical Analgesic, (BIOFREEZE EX) Apply 1 application topically as needed (Back and Knee pain).    . metFORMIN (GLUCOPHAGE) 500 MG tablet Take 500 mg by mouth See admin instructions. Patient takes 500mg  in the morning and 1000mg  in the evening    . metoprolol tartrate (LOPRESSOR) 25 MG tablet Take 25 mg by mouth 2 (two) times daily.    . Multiple Vitamins-Minerals (PRESERVISION AREDS 2 PO) Take 1 tablet by mouth 2 (two) times daily.    Marland Kitchen omeprazole (PRILOSEC) 20 MG capsule Take 20 mg by mouth daily.     No current facility-administered medications for this encounter.     Allergies  Allergen Reactions  . Iodinated Diagnostic Agents Anaphylaxis  . Hibiclens [Chlorhexidine Gluconate]   . Iohexol      Desc: ANGIO EDEMA W/ ANAPHYLAXIS PRIOR... HIVES POST 13 HR PREP...OK/A.C.   . Levaquin [Levofloxacin In D5w]   . Oxycodone-Acetaminophen Nausea And Vomiting  . Propoxyphene N-Acetaminophen Nausea And Vomiting    Social History   Socioeconomic History  . Marital status: Divorced    Spouse name: Not on file  . Number of children: Not on file  . Years of education: Not on file  . Highest education level: Not on file    Social Needs  . Financial resource strain: Not on file  . Food insecurity - worry: Not on file  . Food insecurity - inability: Not on file  . Transportation needs - medical: Not on file  . Transportation needs - non-medical: Not on file  Occupational History  . Occupation: Critical care nurse  Tobacco Use  . Smoking status: Former Smoker    Packs/day: 0.25    Years: 32.00    Pack years: 8.00    Types: Cigarettes    Last attempt to quit: 02/20/2004    Years since quitting: 13.3  . Smokeless tobacco: Never Used  Substance and Sexual Activity  . Alcohol use: No  . Drug use: No  . Sexual activity: Yes    Birth control/protection: Post-menopausal  Other Topics Concern  . Not on file  Social History Narrative  . Not on file  Family History  Problem Relation Age of Onset  . Heart disease Mother        Heart Disease before age 22  . Heart disease Father   . Heart disease Sister        Heart Disease before age 75  . Breast cancer Daughter 78    ROS- All systems are reviewed and negative except as per the HPI above  Physical Exam: Vitals:   06/25/17 1542  BP: (!) 144/76  Pulse: 68  Weight: 170 lb 9.6 oz (77.4 kg)  Height: 5\' 5"  (1.651 m)   Wt Readings from Last 3 Encounters:  06/25/17 170 lb 9.6 oz (77.4 kg)  06/12/17 174 lb (78.9 kg)  06/09/17 171 lb 12.8 oz (77.9 kg)    Labs: Lab Results  Component Value Date   NA 136 06/08/2017   K 4.0 06/08/2017   CL 100 (L) 06/08/2017   CO2 25 06/08/2017   GLUCOSE 239 (H) 06/08/2017   BUN 26 (H) 06/08/2017   CREATININE 1.21 (H) 06/08/2017   CALCIUM 9.0 06/08/2017   No results found for: INR Lab Results  Component Value Date   CHOL 113 06/02/2017   HDL 14 (L) 06/02/2017   LDLCALC 63 06/02/2017   TRIG 180 (H) 06/02/2017     GEN- The patient is well appearing, alert and oriented x 3 today.   Head- normocephalic, atraumatic Eyes-  Sclera clear, conjunctiva pink Ears- hearing intact Oropharynx- clear Neck-  supple, no JVP Lymph- no cervical lymphadenopathy Lungs- Clear to ausculation bilaterally, normal work of breathing Heart-  regular rate and rhythm, no murmurs, rubs or gallops, PMI not laterally displaced GI- soft, NT, ND, + BS Extremities- no clubbing, cyanosis, or edema MS- no significant deformity or atrophy Skin- no rash or lesion Psych- euthymic mood, full affect Neuro- strength and sensation are intact  EKG-NSR at 68 bpm, pr int 206 ms, qrs int 76 ms, qtc 452 ms Epic records reviewed    Assessment and Plan: 1. New onset persistent afib Failed cardioversion/sotalol and flecainide Now loading on amiodarone 200 mg bid, will continue at this dose until seen by Dr. Harrington Challenger 2/25 For the most part she is staying in [place] but still is experiencing some episodes of afib Explained to pt/daughter that this can be seen until drug gets at full loading dose They were encouraged to keep a record of her spells of fib for burden She can take an extra 1/2 tab of metoprolol if needed for afib breakthrough Continue metoprolol to 25 mg bid  Sleep study for snoring pending  2. Chadsvasc score of at least 6 Bleeding precautions discussed Continue eliquis at 5 mg bid  3. HTN Stable  A lot of her antihypertensives stopped for low BP in hospital  F/u with Dr. Harrington Challenger 2/15 afib clinic as needed  Butch Penny C. Nachmen Mansel, [facility] [address] [place], [place] [address] [phone]

## 2017-07-02 ENCOUNTER — Other Ambulatory Visit (HOSPITAL_COMMUNITY): Payer: Self-pay | Admitting: *Deleted

## 2017-07-02 MED ORDER — AMIODARONE HCL 200 MG PO TABS: 200.0000 mg | ORAL_TABLET | Freq: Two times a day (BID) | ORAL | 0 refills | Status: DC

## 2017-07-02 MED ORDER — APIXABAN 5 MG PO TABS: 5.0000 mg | ORAL_TABLET | Freq: Two times a day (BID) | ORAL | 0 refills | Status: DC

## 2017-07-08 ENCOUNTER — Ambulatory Visit (HOSPITAL_BASED_OUTPATIENT_CLINIC_OR_DEPARTMENT_OTHER): Payer: Medicare Other | Attending: Cardiology | Admitting: Cardiology

## 2017-07-08 VITALS — Ht 65.0 in | Wt 165.0 lb

## 2017-07-08 DIAGNOSIS — G4733 Obstructive sleep apnea (adult) (pediatric): Secondary | ICD-10-CM

## 2017-07-08 DIAGNOSIS — G4736 Sleep related hypoventilation in conditions classified elsewhere: Secondary | ICD-10-CM | POA: Insufficient documentation

## 2017-07-09 ENCOUNTER — Other Ambulatory Visit: Payer: Self-pay | Admitting: Neurological Surgery

## 2017-07-09 ENCOUNTER — Other Ambulatory Visit (HOSPITAL_COMMUNITY): Payer: Self-pay | Admitting: *Deleted

## 2017-07-09 DIAGNOSIS — M21371 Foot drop, right foot: Secondary | ICD-10-CM

## 2017-07-09 MED ORDER — METOPROLOL TARTRATE 25 MG PO TABS: 25.0000 mg | ORAL_TABLET | Freq: Two times a day (BID) | ORAL | 1 refills | Status: DC

## 2017-07-10 NOTE — Telephone Encounter (Signed)
Informed patient of sleep study results and patient understanding was verbalized. Patient understands she has significant OSA during REM sleep (dream state) with significant drops in her oxygen. Patient understands Dr Radford Pax recommends a CPAP titration in lab. Patient understands her Titration study will be done at [facility] sleep lab. Patient understands she will receive a call in a week or so. Patient understands to call if she does not receive the sleep packet in a timely manner. Patient agrees with treatment and thanked me for call.

## 2017-07-10 NOTE — Procedures (Signed)
   NAME: Gloria [name] DATE OF BIRTH:  Oct 03, 1940 MEDICAL RECORD NUMBER [id number]  LOCATION: [facility]  PHYSICIAN: Anabela Crayton  DATE OF STUDY: 07/08/2017  SLEEP STUDY TYPE: Nocturnal Polysomnogram               REFERRING PHYSICIAN: Sueanne Margarita, MD   Gender: Female  D.O.B: 1940-08-13  Age (years): 36  Referring Provider: Fransico Him MD, ABSM  Height (inches): 65  Interpreting Physician: Fransico Him MD, ABSM  Weight (lbs): 165  RPSGT: Carolin Coy  BMI: 27  MRN: [id number]  Neck Size: 14.00   CLINICAL INFORMATION  Sleep Study Type: NPSG Indication for sleep study: Diabetes, Hypertension, OSA Epworth Sleepiness Score: 4  SLEEP STUDY TECHNIQUE  As per the AASM Manual for the Scoring of Sleep and Associated Events v2.3 (April 2016) with a hypopnea requiring 4% desaturations. The channels recorded and monitored were frontal, central and occipital EEG, electrooculogram (EOG), submentalis EMG (chin), nasal and oral airflow, thoracic and abdominal wall motion, anterior tibialis EMG, snore microphone, electrocardiogram, and pulse oximetry.  MEDICATIONS  Medications self-administered by patient taken the night of the study : AMIODARONE, ELIQUIS, METOPROLOL, FENOFIBRATE, BENADRYL, TRAMADOL, LIPITOR, METFORMIN, LANTUS  SLEEP ARCHITECTURE  The study was initiated at 10:17:52 PM and ended at 5:22:57 AM. Sleep onset time was 15.3 minutes and the sleep efficiency was 68.0%%. The total sleep time was 289.0 minutes. Stage REM latency was 152.5 minutes. The patient spent 9.5%% of the night in stage N1 sleep, 87.2%% in stage N2 sleep, 0.0%% in stage N3 and 3.29% in REM. Alpha intrusion was absent. Supine sleep was 0.00%.  RESPIRATORY PARAMETERS  The overall apnea/hypopnea index (AHI) was 3.5 per hour. There were 1 total apneas, including 1 obstructive, 0 central and 0 mixed apneas. There were 16 hypopneas and 24 RERAs. The AHI during Stage REM sleep was  37.9 per hour. AHI while supine was N/A per hour. The mean oxygen saturation was 90.4%. The minimum SpO2 during sleep was 78.0%. soft snoring was noted during this study.  CARDIAC DATA  The 2 lead EKG demonstrated sinus rhythm. The mean heart rate was 57.8 beats per minute. Other EKG findings include: none.  LEG MOVEMENT DATA  The total PLMS were 0 with a resulting PLMS index of 0.0. Associated arousal with leg movement index was 0.0 .  IMPRESSIONS  No significant obstructive sleep apnea occurred during this study (AHI = 3.5/h) but severe during REM sleep (37.9/hr). No significant central sleep apnea occurred during this study (CAI = 0.0/h).  Moderate oxygen desaturation was noted during this study (Min O2 = 78.0%).  The patient snored with soft snoring volume.  EKG findings include none. Clinically significant periodic limb movements did not occur during sleep. No significant associated arousals.  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  Avoid alcohol, sedatives and other CNS depressants that may worsen sleep apnea and disrupt normal sleep architecture. Sleep hygiene should be reviewed to assess factors that may improve sleep quality.   Weight management and regular exercise should be initiated or continued if appropriate.   [place], American Board of Sleep Medicine  ELECTRONICALLY SIGNED ON:  07/10/2017, 12:17 PM [place] PH: [phone]   FX: [phone] [place]

## 2017-07-10 NOTE — Telephone Encounter (Signed)
-----   Message from Sueanne Margarita, MD sent at 07/10/2017 12:20 PM EST ----- Please let patient know that she has significant OSA during REM sleep with significant drops in her oxygen.  Please set up CPAP titration in lab

## 2017-07-10 NOTE — Telephone Encounter (Deleted)
-----   Message from Sueanne Margarita, MD sent at 07/10/2017 12:20 PM EST ----- Please let patient know that she has significant OSA during REM sleep with significant drops in her oxygen.  Please set up CPAP titration in lab

## 2017-07-11 ENCOUNTER — Ambulatory Visit
Admission: RE | Admit: 2017-07-11 | Discharge: 2017-07-11 | Disposition: A | Discharge status: Home / Self Care | Payer: Medicare Other | Source: Ambulatory Visit | Attending: Neurological Surgery | Admitting: Neurological Surgery

## 2017-07-11 DIAGNOSIS — M21371 Foot drop, right foot: Secondary | ICD-10-CM

## 2017-07-11 MED ORDER — GADOBENATE DIMEGLUMINE 529 MG/ML IV SOLN
10.0000 mL | Freq: Once | INTRAVENOUS | Status: AC | PRN
  Administered 2017-07-11: 10 mL via INTRAVENOUS

## 2017-07-13 ENCOUNTER — Encounter: Payer: Self-pay | Admitting: Internal Medicine

## 2017-07-13 ENCOUNTER — Ambulatory Visit: Payer: Medicare Other | Admitting: Internal Medicine

## 2017-07-13 ENCOUNTER — Ambulatory Visit: Payer: Medicare Other | Admitting: Cardiovascular Disease

## 2017-07-13 VITALS — BP 160/70 | HR 67 | Ht 65.0 in | Wt 167.8 lb

## 2017-07-13 DIAGNOSIS — E782 Mixed hyperlipidemia: Secondary | ICD-10-CM

## 2017-07-13 DIAGNOSIS — Z48812 Encounter for surgical aftercare following surgery on the circulatory system: Secondary | ICD-10-CM | POA: Diagnosis not present

## 2017-07-13 DIAGNOSIS — I10 Essential (primary) hypertension: Secondary | ICD-10-CM

## 2017-07-13 DIAGNOSIS — I480 Paroxysmal atrial fibrillation: Secondary | ICD-10-CM

## 2017-07-13 MED ORDER — LOSARTAN POTASSIUM 25 MG PO TABS: 25.0000 mg | ORAL_TABLET | Freq: Every day | ORAL | 3 refills | Status: DC

## 2017-07-13 NOTE — Progress Notes (Signed)
Cardiology Office Note   Date:  07/13/2017   ID:  Bena, Kobel 01-Apr-1941, MRN [id number]  PCP:  Haywood Pao, MD  Cardiologist:   Dorris Carnes, MD   F/U of PAF   History of Present Illness: Gloria [name] is a 77 y.o. female with a history of PAF, HTN, CV dz, HL, DM  Seen in ED on 05/28/17 with palpitations and SOB  Found to be in atrial fibrllation DIfficlut to control  Failed sotalol Placed on flecanide then cardioverted  Reverted to atrial fib   Sent home on amiodarone with outpt f/u for cardioversion   She converted to SR with amio alone  Seen by Maximino Greenland  The pt says she feels much better when she is in SR  Breathing is better     She brings in BP log today  BP is very labile  Initially 105 to 177   Now 110 to 150s    Has f/u for carotid USN  Also hasa appt with Dr Ellene Route for back, possible surgery     Current Meds  Medication Sig  . amiodarone (PACERONE) 200 MG tablet Take 1 tablet (200 mg total) by mouth 2 (two) times daily.  Marland Kitchen apixaban (ELIQUIS) 5 MG TABS tablet Take 1 tablet (5 mg total) by mouth 2 (two) times daily.  Marland Kitchen aspirin EC 81 MG EC tablet Take 1 tablet (81 mg total) by mouth daily.  Marland Kitchen atorvastatin (LIPITOR) 80 MG tablet Take 80 mg by mouth daily.  . carboxymethylcellulose (REFRESH PLUS) 0.5 % SOLN Place 1 drop into both eyes daily.  . diphenhydrAMINE (BENADRYL) 25 MG tablet Take 25 mg by mouth every 6 (six) hours as needed for allergies.  . fenofibrate 160 MG tablet Take 160 mg by mouth daily.  Marland Kitchen HYDROcodone-acetaminophen (NORCO/VICODIN) 5-325 MG per tablet Take 1-2 tablets by mouth every 4 (four) hours as needed for pain.  Marland Kitchen insulin glargine (LANTUS) 100 UNIT/ML injection Inject 34 Units into the skin at bedtime.   Marland Kitchen JANUVIA 100 MG tablet Take 100 mg by mouth daily.  Marland Kitchen lidocaine (LIDODERM) 5 % Place 1 patch onto the skin as needed (Back Pain). Remove & Discard patch within 12 hours or as directed by MD  . Menthol, Topical Analgesic,  (BIOFREEZE EX) Apply 1 application topically as needed (Back and Knee pain).  . metFORMIN (GLUCOPHAGE) 500 MG tablet Take 500 mg by mouth See admin instructions. Patient takes 500mg  in the morning and 1000mg  in the evening  . metoprolol tartrate (LOPRESSOR) 25 MG tablet Take 1 tablet (25 mg total) by mouth 2 (two) times daily.  . Multiple Vitamins-Minerals (PRESERVISION AREDS 2 PO) Take 1 tablet by mouth 2 (two) times daily.  Marland Kitchen omeprazole (PRILOSEC) 20 MG capsule Take 20 mg by mouth daily.     Allergies:   Iodinated diagnostic agents; Hibiclens [chlorhexidine gluconate]; Iohexol; Levaquin [levofloxacin in d5w]; Oxycodone-acetaminophen; and Propoxyphene n-acetaminophen   Past Medical History:  Diagnosis Date  . Arthritis   . Atrial fibrillation ([place])   . Carotid artery occlusion   . Diabetes mellitus   . Diabetic retinopathy ([place])   . GERD (gastroesophageal reflux disease)   . Hyperlipidemia   . Hypertension   . Macular degeneration    Right eye only  . Peripheral vascular disease ([facility])    hx rt carotid s/p  . Pneumonia    hx  . PONV (postoperative nausea and vomiting)     Past Surgical History:  Procedure Laterality Date  .  CARDIOVERSION N/A 06/07/2017   Procedure: CARDIOVERSION;  Surgeon: Thompson Grayer, MD;  Location: [facility];  Service: Cardiovascular;  Laterality: N/A;  . CAROTID ENDARTERECTOMY  Jan. 19, 2007   Right  cea  . CHOLECYSTECTOMY  1968  . LUMBAR LAMINECTOMY/DECOMPRESSION MICRODISCECTOMY  02/27/2012   Procedure: LUMBAR LAMINECTOMY/DECOMPRESSION MICRODISCECTOMY 2 LEVELS;  Surgeon: Kristeen Miss, MD;  Location: [facility];  Service: Neurosurgery;  Laterality: N/A;  Lumbar two-three, four-five Laminectomy/foraminotomy  . SPINAL CORD DECOMPRESSION  Oct. 2013     Social History:  The patient  reports that she quit smoking about 13 years ago. Her smoking use included cigarettes. She has a 8.00 pack-year smoking history. she has never used smokeless tobacco. She reports  that she does not drink alcohol or use drugs.   Family History:  The patient's family history includes Breast cancer (age of onset: 25) in her daughter; Heart disease in her father, mother, and sister.    ROS:  Please see the history of present illness. All other systems are reviewed and  Negative to the above problem except as noted.    PHYSICAL EXAM: VS:  BP (!) 160/70   Pulse 67   Ht 5\' 5"  (1.651 m)   Wt 167 lb 12.8 oz (76.1 kg)   SpO2 98%   BMI 27.92 kg/m   GEN: Well nourished, well developed, in no acute distress  HEENT: normal  Neck:JVP normal   Cardiac: RRR; no murmurs, rubs, or gallops,no edema  Respiratory:  clear to auscultation bilaterally, normal work of breathing GI: soft, nontender, nondistended, + BS  No hepatomegaly  MS: no deformity Moving all extremities   Skin: warm and dry, no rash Neuro:  Strength and sensation are intact Psych: euthymic mood, full affect   EKG:  EKG is ordered today.   Lipid Panel    Component Value Date/Time   CHOL 113 06/02/2017 0514   TRIG 180 (H) 06/02/2017 0514   HDL 14 (L) 06/02/2017 0514   CHOLHDL 8.1 06/02/2017 0514   VLDL 36 06/02/2017 0514   LDLCALC 63 06/02/2017 0514      Wt Readings from Last 3 Encounters:  07/13/17 167 lb 12.8 oz (76.1 kg)  07/08/17 165 lb (74.8 kg)  06/25/17 170 lb 9.6 oz (77.4 kg)      ASSESSMENT AND PLAN:  1   PAF  Clinically remains in SR  She complains of some weakness in hands, hands cold  Concerned SE fo amiodarone FOr now I would keep on current regmimen to continue load   COntinue eliquis  Stop ASA  2  HTN  BP is labile and often elevated   I would resume cozaar at a lower dose    25 mg  Continue to follow BP    3  HL  Continue lipitor and fenofibrate    4  Preop eval  Pt being considered for back surgery   She is now over 3 wks from clinic visit when she was noted to be in [place]   OK to hold eliquis in periop period   Resume after      Current medicines are reviewed at length  with the patient today.  The patient does not have concerns regarding medicines.  Signed, Dorris Carnes, MD  07/13/2017 9:21 AM    [address], [place], [place]  [address] Phone: [phone]; Fax: [phone]

## 2017-07-13 NOTE — Patient Instructions (Signed)
Medication Instructions:  Your physician has recommended you make the following change in your medication:  1.) start losartan 25 mg one tablet once a day   Labwork: None   Testing/Procedures: None  Follow-Up: As scheduled  Any Other Special Instructions Will Be Listed Below (If Applicable).   If you need a refill on your cardiac medications before your next appointment, please call your pharmacy.

## 2017-07-13 NOTE — Progress Notes (Signed)
Please confirm that pt is being set up for CPAP titration at center

## 2017-07-15 ENCOUNTER — Encounter: Payer: Self-pay | Admitting: *Deleted

## 2017-07-15 ENCOUNTER — Telehealth: Payer: Self-pay | Admitting: *Deleted

## 2017-07-15 NOTE — Telephone Encounter (Signed)
-----   Message from Almyra Free sent at 07/13/2017 12:07 PM EST ----- Regarding: RE: pre cert Pt can be scheduled. Let me know when and I will enter precert info. Thanks, Amy ----- Message ----- From: Freada Bergeron, CMA Sent: 07/10/2017  12:56 PM To: [facility] Subject: pre cert                                       Please set up CPAP titration in lab

## 2017-07-15 NOTE — Telephone Encounter (Signed)
Informed patient of titration study and verbalized understanding was indicated. Patient understands her Titration study is scheduled for Friday July 31 2017. Patient understands her sleep study will be done at [facility] sleep lab. Patient understands she will receive a sleep packet in a week or so. Patient understands to call if she does not receive the sleep packet in a timely manner. Patient agrees with treatment and thanked me for call.

## 2017-07-22 ENCOUNTER — Telehealth: Payer: Self-pay | Admitting: Internal Medicine

## 2017-07-22 NOTE — Telephone Encounter (Signed)
New Message    Last seen 06/2017     [facility] Pre-operative Risk Assessment    Request for surgical clearance:  1. What type of surgery is being performed?  Lumbar fusion  2. When is this surgery scheduled?  No scheduled yet   3. What type of clearance is required (medical clearance vs. Pharmacy clearance to hold med vs. Both)? Both   4. Are there any medications that need to be held prior to surgery and how long? eliquis and amiodarone (PACERONE) 200 MG tablet  5. Practice name and name of physician performing surgery? Dr Kristeen Miss  6. What is your office phone and fax number? [phone] fax [phone]   7. Anesthesia type (None, local, MAC, general) ? General    Gloria [name] 07/22/2017, 1:20 PM  _________________________________________________________________   (provider comments below)

## 2017-07-23 ENCOUNTER — Other Ambulatory Visit: Payer: Self-pay | Admitting: Neurological Surgery

## 2017-07-23 NOTE — Telephone Encounter (Signed)
   Primary Cardiologist: Dorris Carnes, MD  Chart reviewed as part of pre-operative protocol coverage. Given past medical history and time since last visit, based on ACC/AHA guidelines, Damon Baisch Rogue would be at acceptable risk for the planned procedure without further cardiovascular testing.   Please call with questions. Note, patient was cleared by Dr. Harrington Challenger during recent office visit. Pending advise from our pharmacist regarding Eliquis. Would recommend continue amiodarone through the surgery, no need to hold.   Almyra Deforest, [place] 07/23/2017, 5:36 PM

## 2017-07-24 NOTE — Telephone Encounter (Signed)
See recommendation by Dr. Harrington Challenger

## 2017-07-24 NOTE — Telephone Encounter (Signed)
Agree to hold 3 days   SHe was in sinus rhythm.  She is on amiodarone   Has not had a stroke   Resume when safe after

## 2017-07-24 NOTE — Telephone Encounter (Signed)
Pt takes Eliquis for afib with CHADS2VASc score of 7 (age x2, sex, HTN, CAD, CHF, DM). CrCl is 44mL/min. We typically hold Eliquis for 3 days prior to spinal procedures, however with increased cardiac risk and CHADS2VASc score of 7, will defer to Dr Harrington Challenger for recommendation regarding periprocedural Xarelto.

## 2017-07-27 NOTE — Telephone Encounter (Signed)
   Primary Cardiologist: Dorris Carnes, MD  Chart reviewed as part of pre-operative protocol coverage. Given past medical history and time since last visit, based on ACC/AHA guidelines, Gloria [name] would be at acceptable risk for the planned procedure without further cardiovascular testing.   Per recs from pharmD and Dr. Harrington Challenger, Pt will need to hold Eliquis 3 days prior to procedure. Resume after, once safe from a surgical standpoint.    I will route this recommendation to the requesting party via Epic fax function and remove from pre-op pool.  Please call with questions.  Lyda Jester, PA-C 07/27/2017, 2:16 PM

## 2017-07-30 ENCOUNTER — Other Ambulatory Visit (HOSPITAL_COMMUNITY): Payer: Self-pay | Admitting: Nurse Practitioner

## 2017-07-31 ENCOUNTER — Ambulatory Visit (HOSPITAL_BASED_OUTPATIENT_CLINIC_OR_DEPARTMENT_OTHER): Payer: Medicare Other | Attending: Cardiology | Admitting: Cardiology

## 2017-07-31 DIAGNOSIS — G4733 Obstructive sleep apnea (adult) (pediatric): Secondary | ICD-10-CM | POA: Diagnosis present

## 2017-08-03 ENCOUNTER — Other Ambulatory Visit (HOSPITAL_COMMUNITY): Payer: Self-pay | Admitting: Nurse Practitioner

## 2017-08-03 ENCOUNTER — Telehealth: Payer: Self-pay | Admitting: Internal Medicine

## 2017-08-03 NOTE — Telephone Encounter (Signed)
Sunday dizzy with position changes.  Thinks BP drops when she stands and goes to walk.   Felt like might pass out.  Lasted 5-10 seconds at a time.  Has happened prior to yesterday, not as bad.  Thinks all started after adding losartan 25 mg.   Also reports nausea on/off, shakiness, freezing cold hand and feet.  Was informed by afib clinic these are s/e of amiodarone. Continues 200 mg BID.  Has back surgery on 08/14/17. BP 114/58 yesterday, HR 50s.   States it goes high a lot too (160s). Dr. Harrington Challenger aware per last ov note.    I advised pt to take losartan around 2pm daily.  She takes metoprolol 25 mg AM and PM.  She is aware I will send this information to Dr. Harrington Challenger and will call her back with any new recommendations.

## 2017-08-03 NOTE — Telephone Encounter (Signed)
New Message   Pt c/o BP issue:  1. What are your last 5 BP readings? 114/45, 166/70 2. Are you having any other symptoms (ex. Dizziness, headache, blurred vision, passed out)? dizziness 3. What is your medication issue? Patient is not sure what medication is causing her to feel dizzy. She takes amiodarone (PACERONE) 200 MG tablet, ELIQUIS 5 MG TABS tablet, losartan (COZAAR) 25 MG tablet and metoprolol tartrate (LOPRESSOR) 25 MG tablet

## 2017-08-03 NOTE — Telephone Encounter (Signed)
Advised patient to stop losartan. Keep track of BP and any symptoms.  We can reassess after back surgery. Pt in agreement with this plan. Has f/u with Dr. Harrington Challenger on 09/21/17.

## 2017-08-03 NOTE — Telephone Encounter (Signed)
Stop losartan then and follow BP  Reassess after surgery

## 2017-08-04 ENCOUNTER — Ambulatory Visit: Payer: Medicare Other | Admitting: Family

## 2017-08-04 ENCOUNTER — Encounter (HOSPITAL_COMMUNITY): Payer: Medicare Other

## 2017-08-05 NOTE — Procedures (Signed)
NAME: Gloria [name] DATE OF BIRTH:  [birth year] MEDICAL RECORD NUMBER [id number]  LOCATION: [facility]  PHYSICIAN: Shiquan Mathieu  DATE OF STUDY: 07/31/2017  SLEEP STUDY TYPE: Positive Airway Pressure Titration               REFERRING PHYSICIAN: Sueanne Margarita, MD    Gender: Female D.O.B: 1940-10-27 Age (years): 67 Referring Provider: Fransico Him MD, ABSM Height (inches): 65 Interpreting Physician: Fransico Him MD, ABSM Weight (lbs): 165 RPSGT: Earney Hamburg BMI: 27 MRN: [id number] Neck Size: 14.00  CLINICAL INFORMATION The patient is referred for a CPAP titration to treat sleep apnea.  SLEEP STUDY TECHNIQUE As per the AASM Manual for the Scoring of Sleep and Associated Events v2.3 (April 2016) with a hypopnea requiring 4% desaturations.  The channels recorded and monitored were frontal, central and occipital EEG, electrooculogram (EOG), submentalis EMG (chin), nasal and oral airflow, thoracic and abdominal wall motion, anterior tibialis EMG, snore microphone, electrocardiogram, and pulse oximetry. Continuous positive airway pressure (CPAP) was initiated at the beginning of the study and titrated to treat sleep-disordered breathing.  MEDICATIONS Medications self-administered by patient taken the night of the study : AMIODARONE, ELIQUIS, METOPROLOL, FENOFIBRATE, BENADRYL, TRAMADOL, LIPITOR, METFORMIN, LANTUS, TYLENOL  TECHNICIAN COMMENTS Comments added by technician: Patient had difficulty initiating sleep. Comments added by scorer: N/A  RESPIRATORY PARAMETERS Optimal PAP Pressure (cm): 11  AHI at Optimal Pressure (/hr):0.0 Overall Minimal O2 (%):90.0  Supine % at Optimal Pressure (%):60 Minimal O2 at Optimal Pressure (%): 91.0   SLEEP ARCHITECTURE The study was initiated at 10:01:18 PM and ended at 4:36:13 AM.  Sleep onset time was 2.2 minutes and the sleep efficiency was 65.5%%. The total sleep time was 258.5 minutes.  The patient  spent 8.3%% of the night in stage N1 sleep, 80.7%% in stage N2 sleep, 0.0%% in stage N3 and 11.03% in REM.Stage REM latency was 325.5 minutes  Wake after sleep onset was 134.2. Alpha intrusion was absent. Supine sleep was 29.59%.  CARDIAC DATA The 2 lead EKG demonstrated sinus rhythm. The mean heart rate was 58.9 beats per minute. Other EKG findings include: PVCs.  LEG MOVEMENT DATA The total Periodic Limb Movements of Sleep (PLMS) were 0. The PLMS index was 0.0. A PLMS index of <15 is considered normal in adults.  IMPRESSIONS - The optimal PAP pressure was 11 cm of water. - Central sleep apnea was not noted during this titration (CAI = 0.0/h). - Significant oxygen desaturations were not observed during this titration (min O2 = 90.0%). - The patient snored with soft snoring volume during this titration study. - PVCs were observed during this study. - Clinically significant periodic limb movements were not noted during this study. Arousals associated with PLMs were rare.  DIAGNOSIS - Obstructive Sleep Apnea (327.23 [G47.33 ICD-10]) - PVCs  RECOMMENDATIONS - Trial of CPAP therapy on 11 cm H2O with a Small size Fisher&Paykel Full Face Mask Simplus mask and heated humidification. - Avoid alcohol, sedatives and other CNS depressants that may worsen sleep apnea and disrupt normal sleep architecture. - Sleep hygiene should be reviewed to assess factors that may improve sleep quality. - Weight management and regular exercise should be initiated or continued. - Return to Sleep Center for re-evaluation after 10 weeks of [place], American Board of Sleep Medicine  ELECTRONICALLY SIGNED ON:  08/05/2017, 9:47 AM [place] PH: [phone]   FX: [phone] [place]  SLEEP MEDICINE

## 2017-08-10 ENCOUNTER — Telehealth: Payer: Self-pay | Admitting: *Deleted

## 2017-08-10 ENCOUNTER — Inpatient Hospital Stay (HOSPITAL_COMMUNITY): Admission: RE | Admit: 2017-08-10 | Payer: Medicare Other | Source: Ambulatory Visit

## 2017-08-10 NOTE — Telephone Encounter (Signed)
-----   Message from Sueanne Margarita, MD sent at 08/05/2017  9:50 AM EDT ----- Please let patient know that they had a successful PAP titration and let DME know that orders are in EPIC.  Please set up 10 week OV with me.

## 2017-08-10 NOTE — Telephone Encounter (Signed)
Informed patient of CPAP titration results and verbalized understanding was indicated. Patient understands she had a successful CPAP titration. Patient understands doctor Radford Pax has ordered her a new CPAP. Patient understands she will be contacted by [facility] to set up her cpap. She understands to call if [facility] does not contact her with new setup in a timely manner. She understands she will be called once confirmation has been received from [facility] that she has received her new machine to schedule 10 week follow up appointment.  [facility] notified of new cpap order Please add to airview She was grateful for the call and thanked me

## 2017-08-11 ENCOUNTER — Encounter: Payer: Self-pay | Admitting: Family

## 2017-08-11 ENCOUNTER — Ambulatory Visit (HOSPITAL_COMMUNITY)
Admission: RE | Admit: 2017-08-11 | Discharge: 2017-08-11 | Disposition: A | Discharge status: Home / Self Care | Payer: Medicare Other | Source: Ambulatory Visit | Attending: Family | Admitting: Family

## 2017-08-11 ENCOUNTER — Other Ambulatory Visit: Payer: Self-pay

## 2017-08-11 ENCOUNTER — Ambulatory Visit: Payer: Medicare Other | Admitting: Family

## 2017-08-11 VITALS — BP 171/68 | HR 56 | Resp 18 | Ht 65.0 in | Wt 167.6 lb

## 2017-08-11 DIAGNOSIS — Z9889 Other specified postprocedural states: Secondary | ICD-10-CM

## 2017-08-11 DIAGNOSIS — I6523 Occlusion and stenosis of bilateral carotid arteries: Secondary | ICD-10-CM

## 2017-08-11 DIAGNOSIS — Z87891 Personal history of nicotine dependence: Secondary | ICD-10-CM | POA: Diagnosis not present

## 2017-08-11 NOTE — Patient Instructions (Signed)

## 2017-08-11 NOTE — Progress Notes (Signed)
Chief Complaint: Follow up Extracranial Carotid Artery Stenosis   History of Present Illness  Gloria [name] is a 77 y.o. female who is status post right CEA in 2007 by Dr. Amedeo Plenty, Dr. Trula Slade has been monitoring her carotid stenosis since Dr. Amedeo Plenty departure.    She denies any history of TIA or stroke symptoms, specifically she denies a history of amaurosis fugax or monocular blindness, unilateral facial drooping, hemiplegia, or receptive or expressive aphasia.    The patient states she had a lumbar decompression by Dr. Ellene Route in October 2013, has some residual left leg swelling that has been interrogated with Doppler and they found no vascular problem or DVT. 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  Her daughter died in Mar 13, 2017 of metastatic breast cancer.  Pt states Dr. Zadie Rhine is her ophthalmologist.   She was working part time as an Therapist, sports in a surgical center, stopped as her right leg is very weak, states she needs L4-5 surgery.   Diabetic: Yes, last A1C result on file was 7.5 on 06-01-17 Tobacco use: former smoker, quit in 2005  Pt meds include: Statin : Yes ASA: Yes Other anticoagulants/antiplatelets: Eliquis for atrial fib, cardioverted January 2019, states she goes into atrial fib if her blood dugar drops, takes a day to return to SR.    Past Medical History:  Diagnosis Date  . Arthritis   . Atrial fibrillation ([place])   . Carotid artery occlusion   . Diabetes mellitus   . Diabetic retinopathy ([place])   . GERD (gastroesophageal reflux disease)   . Hyperlipidemia   . Hypertension   . Macular degeneration    Right eye only  . Peripheral vascular disease ([facility])    hx rt carotid s/p  . Pneumonia    hx  . PONV (postoperative nausea and vomiting)     Social History Social History   Tobacco Use  . Smoking status: Former Smoker    Packs/day: 0.25    Years: 32.00    Pack years:  8.00    Types: Cigarettes    Last attempt to quit: 02/20/2004    Years since quitting: 13.4  . Smokeless tobacco: Never Used  Substance Use Topics  . Alcohol use: No  . Drug use: No    Family History Family History  Problem Relation Age of Onset  . Heart disease Mother        Heart Disease before age 38  . Heart disease Father   . Heart disease Sister        Heart Disease before age 71  . Breast cancer Daughter 21    Surgical History Past Surgical History:  Procedure Laterality Date  . CARDIOVERSION N/A 06/07/2017   Procedure: CARDIOVERSION;  Surgeon: Thompson Grayer, MD;  Location: [facility];  Service: Cardiovascular;  Laterality: N/A;  . CAROTID ENDARTERECTOMY  Jan. 19, 2007   Right  cea  . CHOLECYSTECTOMY  1968  . LUMBAR LAMINECTOMY/DECOMPRESSION MICRODISCECTOMY  02/27/2012   Procedure: LUMBAR LAMINECTOMY/DECOMPRESSION MICRODISCECTOMY 2 LEVELS;  Surgeon: Kristeen Miss, MD;  Location: [place] [facility];  Service: Neurosurgery;  Laterality: N/A;  Lumbar two-three, four-five Laminectomy/foraminotomy  . SPINAL CORD DECOMPRESSION  Oct. 2013    Allergies  Allergen Reactions  . Iodinated Diagnostic Agents Anaphylaxis  . Hibiclens [Chlorhexidine Gluconate]   . Iohexol Hives and Swelling     Desc: ANGIO EDEMA W/ ANAPHYLAXIS PRIOR... HIVES POST 13 HR PREP...OK/A.C.   [place] [  Levofloxacin In D5w] Nausea Only and Other (See Comments)    dizziness  . Oxycodone-Acetaminophen Nausea And Vomiting  . Propoxyphene N-Acetaminophen Nausea And Vomiting    Current Outpatient Medications  Medication Sig Dispense Refill  . amiodarone (PACERONE) 200 MG tablet Take 1 tablet (200 mg total) by mouth daily. (Patient taking differently: Take 200 mg by mouth 2 (two) times daily. ) 30 tablet 3  . amiodarone (PACERONE) 200 MG tablet Take 1 tablet (200 mg total) by mouth daily. 30 tablet 3  . aspirin EC 81 MG EC tablet Take 1 tablet (81 mg total) by mouth daily. 30 tablet 0  . atorvastatin (LIPITOR) 80 MG  tablet Take 80 mg by mouth daily.    . carboxymethylcellulose (REFRESH PLUS) 0.5 % SOLN Place 1 drop into both eyes 3 (three) times daily as needed (for dry/irritated eyes.).     Marland Kitchen diphenhydrAMINE (BENADRYL) 25 MG tablet Take 25 mg by mouth every 6 (six) hours as needed for allergies.    Marland Kitchen ELIQUIS 5 MG TABS tablet TAKE 1 TABLET BY MOUTH TWICE A DAY 60 tablet 6  . fenofibrate 160 MG tablet Take 160 mg by mouth daily.    Marland Kitchen HYDROcodone-acetaminophen (NORCO/VICODIN) 5-325 MG per tablet Take 1-2 tablets by mouth every 4 (four) hours as needed for pain. 60 tablet 0  . JANUVIA 100 MG tablet Take 100 mg by mouth daily.  1  . LANTUS SOLOSTAR 100 UNIT/ML Solostar Pen Inject 5 Units into the skin at bedtime.     . lidocaine (LIDODERM) 5 % Place 1 patch onto the skin daily as needed (Back Pain). Remove & Discard patch within 12 hours or as directed by MD     . losartan (COZAAR) 25 MG tablet Take 25 mg by mouth daily.    . Menthol, Topical Analgesic, (BIOFREEZE EX) Apply 1 application topically as needed (Back and Knee pain).    . metFORMIN (GLUCOPHAGE) 500 MG tablet Take 500-1,000 mg by mouth 2 (two) times daily. Take 1 tablet (500 mg) by mouth in the morning & Take 2 tablets (1000 mg) by mouth in the evening.    . metoprolol tartrate (LOPRESSOR) 25 MG tablet Take 1 tablet (25 mg total) by mouth 2 (two) times daily. 60 tablet 1  . Multiple Vitamins-Minerals (PRESERVISION AREDS 2 PO) Take 1 tablet by mouth 2 (two) times daily.    Marland Kitchen omeprazole (PRILOSEC) 20 MG capsule Take 20 mg by mouth daily before breakfast.     . traMADol (ULTRAM) 50 MG tablet Take 50-100 mg by mouth every 6 (six) hours as needed for pain.  0   No current facility-administered medications for this visit.     Review of Systems : See HPI for pertinent positives and negatives.  Physical Examination  Vitals:   08/11/17 1240 08/11/17 1242  BP: (!) 189/79 (!) 171/68  Pulse: (!) 56   Resp: 18   SpO2: 98%   Weight: 167 lb 9.6 oz (76 kg)    Height: 5\' 5"  (1.651 m)    Body mass index is 27.89 kg/m.  General: WDWN female in NAD GAIT:normal HENT: No gross abnormalities  Eyes: PERRLA Pulmonary: Non-labored respirations, CTAB, no rales, rhonchi, or wheezing. Cardiac: regular rhythm, no detected murmur.  VASCULAR EXAM Carotid Bruits Right Left   Negative Negative    Abdominal aortic pulse is not palpable. Radial pulses are 2+ palpable and equal.      LE Pulses Right Left   POPLITEAL not palpable  not palpable  POSTERIOR TIBIAL not palpable  palpable     DORSALIS PEDIS  ANTERIOR TIBIAL faintly palpable  faintly palpable     Gastrointestinal: soft, nontender, BS WNL, no r/g, no palpated masses. Musculoskeletal: No muscle atrophy/wasting. M/S 5/5 throughout except 3/5 in right LE, Extremities without ischemic changes. Skin: No rashes, no ulcers, no cellulitis.   Neurologic:  A&O X 3; appropriate affect, sensation is normal; speech is normal, CN 2-12 intact, pain and light touch intact in extremities, motor exam as listed above. Psychiatric: Normal thought content, mood appropriate to clinical situation.    Assessment: DREW HERMAN is a 77 y.o. female whois status post right CEA in 2007.  She has no history of stroke or TIA.  DATA Carotid Duplex (08/11/17): No restenosis of theright ICA (CEA site).  Left internal carotid artery stenosis at 40%-59%.  Left external carotid artery stenosis >50%.  Bilateral vertebral artery flow is antegrade.  Bilateral subclavian artery waveforms are normal.  Essentially unchanged since studies on 08-03-2015 and 08-04-16.   Plan: Follow-up in 1year with Carotid Duplex scan.  I discussed in depth with the patient the nature of atherosclerosis, and emphasized the importance of maximal medical  management including strict control of blood pressure, blood glucose, and lipid levels, obtaining regular exercise, and continued cessation of smoking.  The patient is aware that without maximal medical management the underlying atherosclerotic disease process will progress, limiting the benefit of any interventions. The patient was given information about stroke prevention and what symptoms should prompt the patient to seek immediate medical care. Thank you for allowing [place] to participate in this patient's care.  Clemon Chambers, RN, MSN, FNP-C [facility] Office: [phone]  Clinic Physician: Early  08/11/17 12:46 PM

## 2017-08-18 ENCOUNTER — Telehealth: Payer: Self-pay | Admitting: Internal Medicine

## 2017-08-18 NOTE — Telephone Encounter (Signed)
Spoke with patient regarding potential interactions between Amiodarone and Zofran; I advised her to keep taking her amiodarone and talk to her PCP about an alternative medication for zofran. Pt verbalized understanding and had no additional questions.

## 2017-08-18 NOTE — Telephone Encounter (Signed)
New Message:    Pt c/o medication issue:  1. Name of Medication: amiodarone (PACERONE) 200 MG tablet, Zofran  2. How are you currently taking this medication (dosage and times per day)?  3. Are you having a reaction (difficulty breathing--STAT)? No  4. What is your medication issue? Pt states her pharmacy states she should not be taking both of these medications at the same time. Pt would like advice on what the pharmacy told her.

## 2017-08-24 ENCOUNTER — Telehealth: Payer: Self-pay | Admitting: Internal Medicine

## 2017-08-24 NOTE — Telephone Encounter (Signed)
Patient is retired Warden/ranger. Pt reports swelling in legs now from knee down to toes, started right leg, now both. Swelling was going down in mornings but now is the same in am, no better.  Called it pitting. Denies being in afib. Following low salt diet. Was taking hctz prescribed by Dr. Osborne Casco at some point for BP.  Has been off for several months now. No SOB, denies any other symtpoms.  Still awaiting back surgery.  Scheduled on 4/22 now.  (surgeon needed to r/s).  Aware I will forward to Dr. Harrington Challenger to review/advise and then we will call her back.

## 2017-08-24 NOTE — Telephone Encounter (Signed)
Pt c/o swelling: STAT is pt has developed SOB within 24 hours  1) How much weight have you gained and in what time span? no  2) If swelling, where is the swelling located? Both feet and especially right foot  3) Are you currently taking a fluid pill? No  4) Are you currently SOB? NO  5) Do you have a log of your daily weights (if so, list)? No   6) Have you gained 3 pounds in a day or 5 pounds in a week? No   7) Have you traveled recently? No

## 2017-08-24 NOTE — Telephone Encounter (Signed)
She can try lasix 40 mg with 20 KCL every other day for a few days (3 doses total over 6 days)  Follow edema   If down then stop Call office if still swollen Watch salt

## 2017-08-26 MED ORDER — FUROSEMIDE 40 MG PO TABS: ORAL_TABLET | ORAL | 0 refills | Status: DC

## 2017-08-26 MED ORDER — POTASSIUM CHLORIDE CRYS ER 20 MEQ PO TBCR: EXTENDED_RELEASE_TABLET | ORAL | 0 refills | Status: DC

## 2017-08-26 NOTE — Telephone Encounter (Signed)
Called patient. She restarted herself on hctz 25 mg and increased dose of metoprolol from 25 back to 50 mg (hctz was stopped and metoprolol was decreased from 50 to 25 mg previously by PCP)  Monitoring BP. Started using compression hose.  States swelling is improving, esp left leg.  Would also like to try lasix and potassium for 1 week.  I sent prescription to pharmacy.   She expects a call to have pre op lab work soon.  (back surgery is 4/22). Medication list updated. Asked pt to call back in one week with update on how swelling is.  She is in agreement with this plan.

## 2017-09-01 NOTE — Pre-Procedure Instructions (Addendum)
Gloria [name]  09/01/2017     Your procedure is scheduled on Monday, April 22.  Report to [facility] Admitting at 5:30 AM                 Your surgery or procedure is scheduled for 7:30 AM    Call this number if you have problems the morning of surgery: [phone] - pre- op desk                For any other questions, please call [phone], Monday - Friday 8 AM - 4 PM.   Remember:  Do not eat food or drink liquids after midnight Sunday, April 21.  Take these medicines the morning of surgery with A SIP OF WATER : metoprolol tartrate (LOPRESSOR) omeprazole (PRILOSEC)  May use eye drops. May take if needed: traMADol Veatrice Bourbon) or: HYDROcodone-acetaminophen (NORCO/VICODIN) Phenergan   DO NOT take medications for diabetes the morning of surgery.  Hold Aspirin and Eliquis as instructed by your surgeon.  STOP taking, Aspirin Products (Goody Powder, Excedrin Migraine), Ibuprofen (Advil), Naproxen (Aleve), Vitamins and Herbal Products (ie Fish Oil)  Bring you mask for CPAP  How to Manage Your Diabetes Before and After Surgery  Why is it important to control my blood sugar before and after surgery? . Improving blood sugar levels before and after surgery helps healing and can limit problems. . A way of improving blood sugar control is eating a healthy diet by: o  Eating less sugar and carbohydrates o  Increasing activity/exercise o  Talking with your doctor about reaching your blood sugar goals . High blood sugars (greater than 180 mg/dL) can raise your risk of infections and slow your recovery, so you will need to focus on controlling your diabetes during the weeks before surgery. . Make sure that the doctor who takes care of your diabetes knows about your planned surgery including the date and location.  How do I manage my blood sugar before surgery? . Check your blood sugar at least 4 times a day, starting 2 days before surgery, to make sure that the level  is not too high or low. o Check your blood sugar the morning of your surgery when you wake up and every 2 hours until you get to the Short Stay unit. . If your blood sugar is less than 70 mg/dL, you will need to treat for low blood sugar: o Do not take insulin. o Treat a low blood sugar (less than 70 mg/dL) with  cup of clear juice (cranberry or apple), 4 glucose tablets, OR glucose gel. Recheck blood sugar in 15 minutes after treatment (to make sure it is greater than 70 mg/dL). If your blood sugar is not greater than 70 mg/dL on recheck, call [phone] o  for further instructions. . Report your blood sugar to the short stay nurse when you get to Short Stay.  . If you are admitted to the hospital after surgery: o Your blood sugar will be checked by the staff and you will probably be given insulin after surgery (instead of oral diabetes medicines) to make sure you have good blood sugar levels. o The goal for blood sugar control after surgery is 80-180 mg/d  WHAT DO I DO ABOUT MY DIABETES MEDICATION?  Marland Kitchen Do not take oral diabetes medicines (pills) the morning of surgery.  . THE NIGHT BEFORE SURGERY, take 2 units units of Lantus insulin.  Special Instructions    - Preparing For Surgery  Before surgery, you can play an important role. Because skin is not sterile, your skin needs to be as free of germs as possible. You can reduce the number of germs on your skin by washing with CHG (chlorahexidine gluconate) Soap before surgery.  CHG is an antiseptic cleaner which kills germs and bonds with the skin to continue killing germs even after washing.  Please do not use if you have an allergy to CHG or antibacterial soaps. If your skin becomes reddened/irritated stop using the CHG.  Do not shave (including legs and underarms) for at least 48 hours prior to first CHG shower. It is OK to shave your face.  Please follow these instructions carefully.   1. Shower the NIGHT  BEFORE SURGERY and the MORNING OF SURGERY with CHG.   2. If you chose to wash your hair, wash your hair first as usual with your normal shampoo.  3. After you shampoo, rinse your hair and body thoroughly to remove the shampoo.    Wash Face and genitals (private parts)  with your normal soap, then rinse.  4. Use CHG as you would any other liquid soap. You can apply CHG directly to the skin and wash gently with a scrungie or a clean washcloth.   5. Apply the CHG Soap to your body ONLY FROM THE NECK DOWN.  Do not use on open wounds or open sores. Avoid contact with your eyes, ears, mouth and genitals (private parts).   6. Wash thoroughly, paying special attention to the area where your surgery will be performed.  7. Thoroughly rinse your body with warm water from the neck down.  8. DO NOT shower/wash with your normal soap after using and rinsing off the CHG Soap.  9. Pat yourself dry with a CLEAN TOWEL.  10. Wear CLEAN PAJAMAS to bed the night before surgery, wear comfortable clothes the morning of surgery  11. Place CLEAN SHEETS on your bed the night of your first shower and DO NOT SLEEP WITH PETS.  Day of Surgery:  Shower as above Do not apply any deodorants/lotions. Please wear clean clothes to the hospital/surgery center.               Do not wear jewelry, make-up or nail polish.              Do not wear lotions, powders, or perfumes, or deodorant.  Do not shave 48 hours prior to surgery.  Men may shave face and neck.  Do not bring valuables to the hospital.  [facility] is not responsible for any belongings or valuables.  Contacts, dentures or bridgework may not be worn into surgery.  Leave your suitcase in the car.  After surgery it may be brought to your room.  For patients admitted to the hospital, discharge time will be determined by your treatment team.  Patients discharged the day of surgery will not be allowed to drive home.   Please read over the following fact sheets  that you were given. Pain Booklet, Coughing and Deep Breathing and Surgical Site Infections.

## 2017-09-02 ENCOUNTER — Other Ambulatory Visit: Payer: Self-pay | Admitting: Neurological Surgery

## 2017-09-02 ENCOUNTER — Telehealth: Payer: Self-pay | Admitting: Internal Medicine

## 2017-09-02 NOTE — Telephone Encounter (Signed)
New Message  Pt c/o medication issue:  1. Name of Medication: furosemide (LASIX) 40 MG tablet  2. How are you currently taking this medication (dosage and times per day)? Take one tablet every other day for one week  3. Are you having a reaction (difficulty breathing--STAT)? no  4. What is your medication issue? Lasix received good results but the ride side stayed swollen but left side went down some she is schedule to have back surgery Monday because she lost the use of her right foot

## 2017-09-02 NOTE — Telephone Encounter (Signed)
Will route update to Dr. Harrington Challenger as Gloria [name].  Back surgery is scheduled for 09/07/17.

## 2017-09-03 ENCOUNTER — Other Ambulatory Visit: Payer: Self-pay

## 2017-09-03 ENCOUNTER — Encounter (HOSPITAL_COMMUNITY)
Admission: RE | Admit: 2017-09-03 | Discharge: 2017-09-03 | Disposition: A | Discharge status: Home / Self Care | Payer: Medicare Other | Source: Ambulatory Visit | Attending: Neurological Surgery | Admitting: Neurological Surgery

## 2017-09-03 ENCOUNTER — Encounter (HOSPITAL_COMMUNITY): Payer: Self-pay

## 2017-09-03 ENCOUNTER — Telehealth: Payer: Self-pay | Admitting: Internal Medicine

## 2017-09-03 DIAGNOSIS — E782 Mixed hyperlipidemia: Secondary | ICD-10-CM

## 2017-09-03 DIAGNOSIS — I6523 Occlusion and stenosis of bilateral carotid arteries: Secondary | ICD-10-CM

## 2017-09-03 DIAGNOSIS — I129 Hypertensive chronic kidney disease with stage 1 through stage 4 chronic kidney disease, or unspecified chronic kidney disease: Secondary | ICD-10-CM | POA: Diagnosis not present

## 2017-09-03 DIAGNOSIS — N183 Chronic kidney disease, stage 3 (moderate): Secondary | ICD-10-CM | POA: Diagnosis not present

## 2017-09-03 DIAGNOSIS — Z01812 Encounter for preprocedural laboratory examination: Secondary | ICD-10-CM | POA: Diagnosis present

## 2017-09-03 HISTORY — DX: Chronic kidney disease, stage 3 unspecified: N18.30

## 2017-09-03 HISTORY — DX: Chronic kidney disease, stage 3 (moderate): N18.3

## 2017-09-03 HISTORY — DX: Family history of other specified conditions: Z84.89

## 2017-09-03 LAB — BASIC METABOLIC PANEL
ANION GAP: 12 (ref 5–15)
BUN: 26 mg/dL — ABNORMAL HIGH (ref 6–20)
CO2: 27 mmol/L (ref 22–32)
Calcium: 8.5 mg/dL — ABNORMAL LOW (ref 8.9–10.3)
Chloride: 102 mmol/L (ref 101–111)
Creatinine, Ser: 1.68 mg/dL — ABNORMAL HIGH (ref 0.44–1.00)
GFR, EST AFRICAN AMERICAN: 33 mL/min — AB (ref >60)
GFR, EST NON AFRICAN AMERICAN: 28 mL/min — AB (ref >60)
Glucose, Bld: 120 mg/dL — ABNORMAL HIGH (ref 65–99)
POTASSIUM: 3.6 mmol/L (ref 3.5–5.1)
SODIUM: 141 mmol/L (ref 135–145)

## 2017-09-03 LAB — CBC
HEMATOCRIT: 35.1 % — AB (ref 36.0–46.0)
HEMOGLOBIN: 11 g/dL — AB (ref 12.0–15.0)
MCH: 28.3 pg (ref 26.0–34.0)
MCHC: 31.3 g/dL (ref 30.0–36.0)
MCV: 90.2 fL (ref 78.0–100.0)
Platelets: 284 10*3/uL (ref 150–400)
RBC: 3.89 MIL/uL (ref 3.87–5.11)
RDW: 18.8 % — ABNORMAL HIGH (ref 11.5–15.5)
WBC: 6.9 10*3/uL (ref 4.0–10.5)

## 2017-09-03 LAB — PROTIME-INR
INR: 1.1
Prothrombin Time: 14.1 seconds (ref 11.4–15.2)

## 2017-09-03 LAB — GLUCOSE, CAPILLARY: Glucose-Capillary: 148 mg/dL — ABNORMAL HIGH (ref 65–99)

## 2017-09-03 LAB — SURGICAL PCR SCREEN
MRSA, PCR: NEGATIVE
STAPHYLOCOCCUS AUREUS: NEGATIVE

## 2017-09-03 NOTE — Progress Notes (Signed)
Anesthesia Chart Review:   Case:  [id number] Date/Time:  09/07/17 0715   Procedure:  L4-5 Laminectomy/Decompression/Posterior lumbar interbody fusion/Peek spacers/infuse (N/A Back) - L4-5 Laminectomy/Decompression/Posterior lumbar interbody fusion/Peek spacers/infuse   Anesthesia type:  General   Pre-op diagnosis:  Aquired spondylolisthesis of lumbosacral region   Location:  [facility] OR ROOM 25 / [place] OR   Surgeon:  Kristeen Miss, MD      DISCUSSION:  - 77 year old female admitted in January 2019 for afib with RVR; underwent cardioversion but was not able to maintain SR. Now on amiodarone.  - Pt was to hold xarelto 3 days before surgery, but on her own decided to hold it for 7 days.    VS: BP (!) 129/46   Pulse (!) 53   Temp 36.6 C   Resp 18   Ht 5' 4.5" (1.638 m)   Wt 166 lb 4.8 oz (75.4 kg)   SpO2 97%   BMI 28.10 kg/m   PROVIDERS: - PCP is Domenick Gong, MD - Cardiologist is Dorris Carnes, MD who is aware of upcoming surgery and gave ok for pt to hold xarelto 3 days prior to surgery. Last office visit 07/13/17   LABS: Cr 1.68, BUN 26. Pt with CKD stage III. Cr has ranged 1.21-2.01 over last 3 months.   (all labs ordered are listed, but only abnormal results are displayed)  Labs Reviewed  GLUCOSE, CAPILLARY - Abnormal; Notable for the following components:      Result Value   Glucose-Capillary 148 (*)    All other components within normal limits  BASIC METABOLIC PANEL - Abnormal; Notable for the following components:   Glucose, Bld 120 (*)    BUN 26 (*)    Creatinine, Ser 1.68 (*)    Calcium 8.5 (*)    GFR calc non Af Amer 28 (*)    GFR calc Af Amer 33 (*)    All other components within normal limits  CBC - Abnormal; Notable for the following components:   Hemoglobin 11.0 (*)    HCT 35.1 (*)    RDW 18.8 (*)    All other components within normal limits  SURGICAL PCR SCREEN  PROTIME-INR  TYPE AND SCREEN     IMAGES:  CXR 06/01/17: Cardiomegaly with diffuse mild  interstitial prominence, likely chronic interstitial changes. Bibasilar atelectasis.   EKG 06/25/17: NSR. Septal infarct, age undetermined. LVH   CV:  Carotid duplex 08/11/17:  - Right Carotid: Velocities in the right ICA are consistent with a 1-39% stenosis. - Left Carotid: Velocities in the left ICA are consistent with a 40-59% stenosis. The ECA appears >50% stenosed. - Vertebrals: Bilateral vertebral arteries demonstrate antegrade flow. - Subclavians: Normal flow hemodynamics were seen in bilateral subclavian arteries.  Echo 06/02/17:  - Left ventricle: The cavity size was normal. Wall thickness was increased in a pattern of mild LVH. The estimated ejection fraction was 55%. Wall motion was normal; there were no regional  wall motion abnormalities. Features are consistent with a pseudonormal left ventricular filling pattern, with concomitant abnormal relaxation and increased filling pressure (grade 2 diastolic dysfunction). - Aortic valve: There was no stenosis. - Mitral valve: Mildly calcified annulus. Mildly calcified leaflets. There was trivial regurgitation. - Left atrium: The atrium was moderately dilated. - Right ventricle: The cavity size was normal. Systolic function was normal. - Right atrium: The atrium was mildly dilated. - Tricuspid valve: Peak RV-RA gradient (S): 41 mm Hg. - Pulmonary arteries: PA peak pressure: 49 mm Hg (S). -  Systemic veins: IVC measured 2.0 cm with < 50% respirophasic variation, suggesting RA pressure 8 mmHg. - Pericardium, extracardiac: A trivial pericardial effusion was identified posterior to the heart. - Impressions: Normal LV size with mild LV hypertrophy. EF 55%. Moderate diastolic dysfunction. Normal RV size and systolic function. No significant valvular abnormalities. Biatrial enlargement. Mild-moderate pulmonary hypertension.   Past Medical History:  Diagnosis Date  . Arthritis    fingers  . Atrial fibrillation ([place])   . Carotid artery  occlusion   . CKD (chronic kidney disease), stage III ([place])   . Diabetes mellitus   . Diabetic retinopathy ([place])   . Dysrhythmia    afib  . Family history of adverse reaction to anesthesia    daughter - violent  . GERD (gastroesophageal reflux disease)   . Hyperlipidemia   . Hypertension   . Macular degeneration    Right eye only  . Peripheral vascular disease ([facility])    hx rt carotid s/p  . Pneumonia    hx, "a long time ago"  . PONV (postoperative nausea and vomiting)   . Sleep apnea     Past Surgical History:  Procedure Laterality Date  . CARDIOVERSION N/A 06/07/2017   Procedure: CARDIOVERSION;  Surgeon: Thompson Grayer, MD;  Location: [place];  Service: Cardiovascular;  Laterality: N/A;  . CAROTID ENDARTERECTOMY  Jan. 19, 2007   Right  cea  . CHOLECYSTECTOMY  1968  . EYE SURGERY Right    cataract.  Retinal tear- right eye  . LUMBAR LAMINECTOMY/DECOMPRESSION MICRODISCECTOMY  02/27/2012   Procedure: LUMBAR LAMINECTOMY/DECOMPRESSION MICRODISCECTOMY 2 LEVELS;  Surgeon: Kristeen Miss, MD;  Location: [place] [facility];  Service: Neurosurgery;  Laterality: N/A;  Lumbar two-three, four-five Laminectomy/foraminotomy  . SPINAL CORD DECOMPRESSION  Oct. 2013    MEDICATIONS: . promethazine (PHENERGAN) 25 MG suppository  . amiodarone (PACERONE) 200 MG tablet  . amiodarone (PACERONE) 200 MG tablet  . aspirin EC 81 MG EC tablet  . atorvastatin (LIPITOR) 80 MG tablet  . carboxymethylcellulose (REFRESH PLUS) 0.5 % SOLN  . diphenhydrAMINE (BENADRYL) 25 MG tablet  . ELIQUIS 5 MG TABS tablet  . fenofibrate 160 MG tablet  . furosemide (LASIX) 40 MG tablet  . hydrochlorothiazide (HYDRODIURIL) 25 MG tablet  . HYDROcodone-acetaminophen (NORCO/VICODIN) 5-325 MG per tablet  . JANUVIA 100 MG tablet  . LANTUS SOLOSTAR 100 UNIT/ML Solostar Pen  . lidocaine (LIDODERM) 5 %  . losartan (COZAAR) 25 MG tablet  . Menthol, Topical Analgesic, (BIOFREEZE EX)  . metFORMIN (GLUCOPHAGE) 500 MG tablet  . metoprolol  tartrate (LOPRESSOR) 25 MG tablet  . Multiple Vitamins-Minerals (PRESERVISION AREDS 2 PO)  . omeprazole (PRILOSEC) 20 MG capsule  . potassium chloride SA (K-DUR,KLOR-CON) 20 MEQ tablet  . traMADol (ULTRAM) 50 MG tablet   No current facility-administered medications for this encounter.     If no changes, I anticipate pt can proceed with surgery as scheduled.   Willeen Cass, FNP-BC [facility] Stay [facility]/Anesthesiology Phone: [phone] 09/03/2017 4:27 PM

## 2017-09-03 NOTE — Telephone Encounter (Signed)
Will route to Dr. Harrington Challenger for review. According to pre clearance done in March, pt was to hold blood thinner for 3 days prior to procedure.

## 2017-09-03 NOTE — Progress Notes (Addendum)
Gloria [name] reports that CBGs run 100- 120, Lantus was discontinued.  Patient stopped taking Eliquis on 08/31/17- patient reported that Dr Harrington Challenger told her 5 days before.  I called and left a message for Dr Harrington Challenger' nurse with information regarding Eliquis.

## 2017-09-03 NOTE — Telephone Encounter (Signed)
New Message  Jan calling from [facility] Pre- Admission scheduling is calling to advise that the patient stopped Eliquis on 08/31/2017. She just wants to let Dr. Harrington Challenger know the time frame. Please call to disucss.

## 2017-09-05 ENCOUNTER — Other Ambulatory Visit (HOSPITAL_COMMUNITY): Payer: Self-pay | Admitting: Nurse Practitioner

## 2017-09-06 ENCOUNTER — Encounter (HOSPITAL_COMMUNITY): Payer: Self-pay | Admitting: Anesthesiology

## 2017-09-06 NOTE — Anesthesia Preprocedure Evaluation (Addendum)
Anesthesia Evaluation  Patient identified by MRN, date of birth, ID band Patient awake    Reviewed: Allergy & Precautions, NPO status , Patient's Chart, lab work & pertinent test results  History of Anesthesia Complications (+) PONV and history of anesthetic complications  Airway Mallampati: I       Dental  (+) Teeth Intact   Pulmonary former smoker,    Pulmonary exam normal breath sounds clear to auscultation       Cardiovascular hypertension, Pt. on home beta blockers and Pt. on medications Normal cardiovascular exam Rhythm:Regular Rate:Normal     Neuro/Psych    GI/Hepatic GERD  Medicated,  Endo/Other  diabetes, Oral Hypoglycemic Agents  Renal/GU      Musculoskeletal   Abdominal Normal abdominal exam  (+)   Peds  Hematology   Anesthesia Other Findings   Reproductive/Obstetrics                           Anesthesia Physical Anesthesia Plan  ASA: III  Anesthesia Plan: General   Post-op Pain Management:    Induction: Intravenous  PONV Risk Score and Plan: 4 or greater and Ondansetron, Dexamethasone and Treatment may vary due to age or medical condition  Airway Management Planned: Oral ETT  Additional Equipment:   Intra-op Plan:   Post-operative Plan: Extubation in OR  Informed Consent: I have reviewed the patients History and Physical, chart, labs and discussed the procedure including the risks, benefits and alternatives for the proposed anesthesia with the patient or authorized representative who has indicated his/her understanding and acceptance.   Dental advisory given  Plan Discussed with: CRNA and Surgeon  Anesthesia Plan Comments:        Anesthesia Quick Evaluation                                  Anesthesia Evaluation  Patient identified by MRN, date of birth, ID band Patient awake    Reviewed: Allergy & Precautions, H&P , NPO status , Patient's Chart, lab  work & pertinent test results, reviewed documented beta blocker date and time   History of Anesthesia Complications (+) PONV  Airway Mallampati: I TM Distance: >3 FB Neck ROM: Full    Dental  (+) Teeth Intact and Dental Advisory Given   Pulmonary pneumonia -, resolved, former smoker,    Pulmonary exam normal       Cardiovascular hypertension, Pt. on medications and Pt. 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)     Musculoskeletal  (+) Arthritis -, Osteoarthritis,    Abdominal (+) + obese,   Peds  Hematology negative hematology ROS (+)   Anesthesia Other Findings H/o angioedema to stress  Reproductive/Obstetrics                          Anesthesia Physical Anesthesia Plan  ASA: III  Anesthesia Plan: General   Post-op Pain Management:    Induction: Intravenous  Airway Management Planned: Oral ETT  Additional  Equipment:   Intra-op Plan:   Post-operative Plan: Extubation in OR  Informed Consent: I have reviewed the patients History and Physical, chart, labs and discussed the procedure including the risks, benefits and alternatives for the proposed anesthesia with the patient or authorized representative who has indicated his/her understanding and acceptance.   Dental advisory given  Plan Discussed with: CRNA and Surgeon  Anesthesia Plan Comments: (Plan routine monitors, GETA)        Anesthesia Quick Evaluation

## 2017-09-07 ENCOUNTER — Inpatient Hospital Stay (HOSPITAL_COMMUNITY): Payer: Medicare Other | Admitting: Anesthesiology

## 2017-09-07 ENCOUNTER — Inpatient Hospital Stay (HOSPITAL_COMMUNITY)
Admission: RE | Disposition: A | Discharge status: Home / Self Care | Payer: Self-pay | Source: Ambulatory Visit | Attending: Neurological Surgery

## 2017-09-07 ENCOUNTER — Inpatient Hospital Stay (HOSPITAL_COMMUNITY): Payer: Medicare Other

## 2017-09-07 ENCOUNTER — Inpatient Hospital Stay (HOSPITAL_COMMUNITY)
Admission: RE | Admit: 2017-09-07 | Discharge: 2017-09-10 | DRG: 454 | Disposition: A | Discharge status: Home / Self Care | Payer: Medicare Other | Source: Ambulatory Visit | Attending: Neurological Surgery | Admitting: Neurological Surgery

## 2017-09-07 ENCOUNTER — Encounter (HOSPITAL_COMMUNITY): Payer: Self-pay | Admitting: *Deleted

## 2017-09-07 ENCOUNTER — Inpatient Hospital Stay (HOSPITAL_COMMUNITY): Payer: Medicare Other | Admitting: Vascular Surgery

## 2017-09-07 DIAGNOSIS — M21371 Foot drop, right foot: Secondary | ICD-10-CM | POA: Diagnosis present

## 2017-09-07 DIAGNOSIS — E1122 Type 2 diabetes mellitus with diabetic chronic kidney disease: Secondary | ICD-10-CM | POA: Diagnosis present

## 2017-09-07 DIAGNOSIS — Z7982 Long term (current) use of aspirin: Secondary | ICD-10-CM | POA: Diagnosis not present

## 2017-09-07 DIAGNOSIS — Z91041 Radiographic dye allergy status: Secondary | ICD-10-CM | POA: Diagnosis not present

## 2017-09-07 DIAGNOSIS — H3530 Unspecified macular degeneration: Secondary | ICD-10-CM | POA: Diagnosis present

## 2017-09-07 DIAGNOSIS — Z881 Allergy status to other antibiotic agents status: Secondary | ICD-10-CM

## 2017-09-07 DIAGNOSIS — E1151 Type 2 diabetes mellitus with diabetic peripheral angiopathy without gangrene: Secondary | ICD-10-CM | POA: Diagnosis present

## 2017-09-07 DIAGNOSIS — I129 Hypertensive chronic kidney disease with stage 1 through stage 4 chronic kidney disease, or unspecified chronic kidney disease: Secondary | ICD-10-CM | POA: Diagnosis present

## 2017-09-07 DIAGNOSIS — K219 Gastro-esophageal reflux disease without esophagitis: Secondary | ICD-10-CM | POA: Diagnosis present

## 2017-09-07 DIAGNOSIS — M5416 Radiculopathy, lumbar region: Secondary | ICD-10-CM | POA: Diagnosis present

## 2017-09-07 DIAGNOSIS — M48061 Spinal stenosis, lumbar region without neurogenic claudication: Secondary | ICD-10-CM | POA: Diagnosis present

## 2017-09-07 DIAGNOSIS — M4316 Spondylolisthesis, lumbar region: Principal | ICD-10-CM | POA: Diagnosis present

## 2017-09-07 DIAGNOSIS — Q762 Congenital spondylolisthesis: Secondary | ICD-10-CM

## 2017-09-07 DIAGNOSIS — Z888 Allergy status to other drugs, medicaments and biological substances status: Secondary | ICD-10-CM | POA: Diagnosis not present

## 2017-09-07 DIAGNOSIS — N183 Chronic kidney disease, stage 3 (moderate): Secondary | ICD-10-CM | POA: Diagnosis present

## 2017-09-07 DIAGNOSIS — Z419 Encounter for procedure for purposes other than remedying health state, unspecified: Secondary | ICD-10-CM

## 2017-09-07 DIAGNOSIS — E11319 Type 2 diabetes mellitus with unspecified diabetic retinopathy without macular edema: Secondary | ICD-10-CM | POA: Diagnosis present

## 2017-09-07 DIAGNOSIS — Z7901 Long term (current) use of anticoagulants: Secondary | ICD-10-CM | POA: Diagnosis not present

## 2017-09-07 DIAGNOSIS — Z87891 Personal history of nicotine dependence: Secondary | ICD-10-CM | POA: Diagnosis not present

## 2017-09-07 DIAGNOSIS — Z794 Long term (current) use of insulin: Secondary | ICD-10-CM

## 2017-09-07 DIAGNOSIS — Z9049 Acquired absence of other specified parts of digestive tract: Secondary | ICD-10-CM

## 2017-09-07 DIAGNOSIS — I4891 Unspecified atrial fibrillation: Secondary | ICD-10-CM | POA: Diagnosis present

## 2017-09-07 DIAGNOSIS — Z885 Allergy status to narcotic agent status: Secondary | ICD-10-CM

## 2017-09-07 DIAGNOSIS — M19049 Primary osteoarthritis, unspecified hand: Secondary | ICD-10-CM | POA: Diagnosis present

## 2017-09-07 DIAGNOSIS — Z79891 Long term (current) use of opiate analgesic: Secondary | ICD-10-CM

## 2017-09-07 DIAGNOSIS — E785 Hyperlipidemia, unspecified: Secondary | ICD-10-CM | POA: Diagnosis present

## 2017-09-07 DIAGNOSIS — Z9841 Cataract extraction status, right eye: Secondary | ICD-10-CM

## 2017-09-07 DIAGNOSIS — Z79899 Other long term (current) drug therapy: Secondary | ICD-10-CM

## 2017-09-07 DIAGNOSIS — D62 Acute posthemorrhagic anemia: Secondary | ICD-10-CM | POA: Diagnosis not present

## 2017-09-07 LAB — GLUCOSE, CAPILLARY
GLUCOSE-CAPILLARY: 118 mg/dL — AB (ref 65–99)
Glucose-Capillary: 162 mg/dL — ABNORMAL HIGH (ref 65–99)
Glucose-Capillary: 221 mg/dL — ABNORMAL HIGH (ref 65–99)
Glucose-Capillary: 215 mg/dL — ABNORMAL HIGH (ref 65–99)

## 2017-09-07 LAB — HEMOGLOBIN A1C
Hgb A1c MFr Bld: 5.7 % — ABNORMAL HIGH (ref 4.8–5.6)
Mean Plasma Glucose: 116.89 mg/dL

## 2017-09-07 LAB — POCT I-STAT 4, (NA,K, GLUC, HGB,HCT)
Glucose, Bld: 112 mg/dL — ABNORMAL HIGH (ref 65–99)
HEMATOCRIT: 25 % — AB (ref 36.0–46.0)
Hemoglobin: 8.5 g/dL — ABNORMAL LOW (ref 12.0–15.0)
Potassium: 3.7 mmol/L (ref 3.5–5.1)
SODIUM: 142 mmol/L (ref 135–145)

## 2017-09-07 LAB — PREPARE RBC (CROSSMATCH)

## 2017-09-07 SURGERY — POSTERIOR LUMBAR FUSION 1 LEVEL
Anesthesia: General | Site: Back

## 2017-09-07 MED ORDER — LIDOCAINE-EPINEPHRINE 1 %-1:100000 IJ SOLN
INTRAMUSCULAR | Status: DC | PRN
  Administered 2017-09-07: 10 mL

## 2017-09-07 MED ORDER — PANTOPRAZOLE SODIUM 40 MG PO TBEC
40.0000 mg | DELAYED_RELEASE_TABLET | Freq: Every day | ORAL | Status: DC
  Administered 2017-09-08 – 2017-09-10 (×3): 40 mg via ORAL
  Filled 2017-09-07 (×3): qty 1

## 2017-09-07 MED ORDER — HYDROCODONE-ACETAMINOPHEN 5-325 MG PO TABS
1.0000 | ORAL_TABLET | ORAL | Status: DC | PRN
Start: 2017-09-07 — End: 2017-09-07

## 2017-09-07 MED ORDER — FENTANYL CITRATE (PF) 250 MCG/5ML IJ SOLN
INTRAMUSCULAR | Status: DC | PRN
  Administered 2017-09-07: 25 ug via INTRAVENOUS
  Administered 2017-09-07: 100 ug via INTRAVENOUS

## 2017-09-07 MED ORDER — PRESERVISION AREDS 2 PO CAPS: ORAL_CAPSULE | Freq: Two times a day (BID) | ORAL | Status: DC

## 2017-09-07 MED ORDER — ACETAMINOPHEN 650 MG RE SUPP
650.0000 mg | RECTAL | Status: DC | PRN
Start: 2017-09-07 — End: 2017-09-10

## 2017-09-07 MED ORDER — METFORMIN HCL 500 MG PO TABS
500.0000 mg | ORAL_TABLET | Freq: Every day | ORAL | Status: DC
  Administered 2017-09-08 – 2017-09-10 (×3): 500 mg via ORAL
  Filled 2017-09-07 (×3): qty 1

## 2017-09-07 MED ORDER — MEPERIDINE HCL 50 MG/ML IJ SOLN: 6.2500 mg | INTRAMUSCULAR | Status: DC | PRN

## 2017-09-07 MED ORDER — POLYETHYLENE GLYCOL 3350 17 G PO PACK
17.0000 g | PACK | Freq: Every day | ORAL | Status: DC | PRN
  Administered 2017-09-09: 17 g via ORAL
  Filled 2017-09-07: qty 1

## 2017-09-07 MED ORDER — ONDANSETRON HCL 4 MG/2ML IJ SOLN: 4.0000 mg | Freq: Four times a day (QID) | INTRAMUSCULAR | Status: DC | PRN

## 2017-09-07 MED ORDER — ONDANSETRON HCL 4 MG PO TABS: 4.0000 mg | ORAL_TABLET | Freq: Four times a day (QID) | ORAL | Status: DC | PRN

## 2017-09-07 MED ORDER — PROSIGHT PO TABS
1.0000 | ORAL_TABLET | Freq: Two times a day (BID) | ORAL | Status: DC
  Administered 2017-09-07 – 2017-09-10 (×6): 1 via ORAL
  Filled 2017-09-07 (×7): qty 1

## 2017-09-07 MED ORDER — LINAGLIPTIN 5 MG PO TABS
5.0000 mg | ORAL_TABLET | Freq: Every day | ORAL | Status: DC
  Administered 2017-09-08 – 2017-09-10 (×3): 5 mg via ORAL
  Filled 2017-09-07 (×4): qty 1

## 2017-09-07 MED ORDER — FENTANYL CITRATE (PF) 250 MCG/5ML IJ SOLN
INTRAMUSCULAR | Status: AC
  Filled 2017-09-07: qty 5

## 2017-09-07 MED ORDER — ATORVASTATIN CALCIUM 20 MG PO TABS
80.0000 mg | ORAL_TABLET | Freq: Every day | ORAL | Status: DC
  Administered 2017-09-10: 80 mg via ORAL
  Filled 2017-09-07: qty 4

## 2017-09-07 MED ORDER — LACTATED RINGERS IV SOLN
INTRAVENOUS | Status: DC | PRN
  Administered 2017-09-07: 10:00:00 via INTRAVENOUS

## 2017-09-07 MED ORDER — ONDANSETRON HCL 4 MG/2ML IJ SOLN
INTRAMUSCULAR | Status: DC | PRN
  Administered 2017-09-07: 4 mg via INTRAVENOUS

## 2017-09-07 MED ORDER — GELATIN ABSORBABLE MT POWD
OROMUCOSAL | Status: DC | PRN
  Administered 2017-09-07: 07:00:00 via TOPICAL

## 2017-09-07 MED ORDER — MENTHOL 3 MG MT LOZG: 1.0000 | LOZENGE | OROMUCOSAL | Status: DC | PRN

## 2017-09-07 MED ORDER — LIDOCAINE 2% (20 MG/ML) 5 ML SYRINGE
INTRAMUSCULAR | Status: DC | PRN
  Administered 2017-09-07: 100 mg via INTRAVENOUS

## 2017-09-07 MED ORDER — PROPOFOL 10 MG/ML IV BOLUS
INTRAVENOUS | Status: AC
  Filled 2017-09-07: qty 20

## 2017-09-07 MED ORDER — THROMBIN 20000 UNITS EX SOLR
CUTANEOUS | Status: AC
  Filled 2017-09-07: qty 20000

## 2017-09-07 MED ORDER — LACTATED RINGERS IV SOLN
INTRAVENOUS | Status: DC
  Administered 2017-09-07: 15:00:00 via INTRAVENOUS

## 2017-09-07 MED ORDER — PROMETHAZINE HCL 25 MG/ML IJ SOLN: 6.2500 mg | INTRAMUSCULAR | Status: DC | PRN

## 2017-09-07 MED ORDER — INSULIN GLARGINE 100 UNIT/ML ~~LOC~~ SOLN
5.0000 [IU] | Freq: Every day | SUBCUTANEOUS | Status: DC
  Administered 2017-09-07 – 2017-09-09 (×3): 5 [IU] via SUBCUTANEOUS
  Filled 2017-09-07 (×3): qty 0.05

## 2017-09-07 MED ORDER — MORPHINE SULFATE (PF) 4 MG/ML IV SOLN
4.0000 mg | INTRAVENOUS | Status: DC | PRN
  Administered 2017-09-07: 4 mg via INTRAVENOUS
  Filled 2017-09-07: qty 1

## 2017-09-07 MED ORDER — PROMETHAZINE HCL 25 MG PO TABS
12.5000 mg | ORAL_TABLET | ORAL | Status: DC | PRN
  Administered 2017-09-07 – 2017-09-10 (×11): 12.5 mg via ORAL
  Filled 2017-09-07 (×9): qty 1

## 2017-09-07 MED ORDER — HYDROMORPHONE HCL 2 MG/ML IJ SOLN
INTRAMUSCULAR | Status: AC
  Administered 2017-09-07: 0.5 mg via INTRAVENOUS
  Filled 2017-09-07: qty 1

## 2017-09-07 MED ORDER — LOSARTAN POTASSIUM 50 MG PO TABS
25.0000 mg | ORAL_TABLET | Freq: Every day | ORAL | Status: DC
  Administered 2017-09-09: 25 mg via ORAL
  Filled 2017-09-07 (×2): qty 1

## 2017-09-07 MED ORDER — METOPROLOL TARTRATE 25 MG PO TABS
25.0000 mg | ORAL_TABLET | Freq: Two times a day (BID) | ORAL | Status: DC
  Administered 2017-09-07 – 2017-09-09 (×2): 25 mg via ORAL
  Filled 2017-09-07 (×3): qty 1

## 2017-09-07 MED ORDER — KETOROLAC TROMETHAMINE 30 MG/ML IJ SOLN: 30.0000 mg | Freq: Once | INTRAMUSCULAR | Status: DC | PRN

## 2017-09-07 MED ORDER — HYDROMORPHONE HCL 2 MG/ML IJ SOLN
0.2500 mg | INTRAMUSCULAR | Status: DC | PRN
  Administered 2017-09-07 (×2): 0.5 mg via INTRAVENOUS

## 2017-09-07 MED ORDER — EPHEDRINE 5 MG/ML INJ
INTRAVENOUS | Status: AC
  Filled 2017-09-07: qty 10

## 2017-09-07 MED ORDER — LACTATED RINGERS IV SOLN
INTRAVENOUS | Status: DC | PRN
  Administered 2017-09-07: 07:00:00 via INTRAVENOUS

## 2017-09-07 MED ORDER — AMIODARONE HCL 200 MG PO TABS
200.0000 mg | ORAL_TABLET | Freq: Every day | ORAL | Status: DC
  Filled 2017-09-07 (×3): qty 1

## 2017-09-07 MED ORDER — ONDANSETRON HCL 4 MG/2ML IJ SOLN
INTRAMUSCULAR | Status: AC
  Filled 2017-09-07: qty 2

## 2017-09-07 MED ORDER — HYDROCODONE-ACETAMINOPHEN 5-325 MG PO TABS
1.0000 | ORAL_TABLET | ORAL | Status: DC | PRN
  Administered 2017-09-09: 1 via ORAL
  Filled 2017-09-07: qty 1

## 2017-09-07 MED ORDER — SENNA 8.6 MG PO TABS
1.0000 | ORAL_TABLET | Freq: Two times a day (BID) | ORAL | Status: DC
  Administered 2017-09-07 – 2017-09-10 (×6): 8.6 mg via ORAL
  Filled 2017-09-07 (×6): qty 1

## 2017-09-07 MED ORDER — ROCURONIUM BROMIDE 10 MG/ML (PF) SYRINGE
PREFILLED_SYRINGE | INTRAVENOUS | Status: AC
  Filled 2017-09-07: qty 5

## 2017-09-07 MED ORDER — SODIUM CHLORIDE 0.9% FLUSH: 3.0000 mL | INTRAVENOUS | Status: DC | PRN

## 2017-09-07 MED ORDER — THROMBIN 5000 UNITS EX SOLR
CUTANEOUS | Status: AC
  Filled 2017-09-07: qty 5000

## 2017-09-07 MED ORDER — TRAMADOL HCL 50 MG PO TABS: 50.0000 mg | ORAL_TABLET | Freq: Four times a day (QID) | ORAL | Status: DC | PRN

## 2017-09-07 MED ORDER — AMIODARONE HCL 200 MG PO TABS: 200.0000 mg | ORAL_TABLET | Freq: Two times a day (BID) | ORAL | Status: DC

## 2017-09-07 MED ORDER — SUGAMMADEX SODIUM 200 MG/2ML IV SOLN
INTRAVENOUS | Status: DC | PRN
  Administered 2017-09-07: 200 mg via INTRAVENOUS

## 2017-09-07 MED ORDER — CEFAZOLIN SODIUM-DEXTROSE 2-4 GM/100ML-% IV SOLN
2.0000 g | Freq: Three times a day (TID) | INTRAVENOUS | Status: AC
  Administered 2017-09-07 – 2017-09-08 (×2): 2 g via INTRAVENOUS
  Filled 2017-09-07: qty 100

## 2017-09-07 MED ORDER — ACETAMINOPHEN 325 MG PO TABS
650.0000 mg | ORAL_TABLET | ORAL | Status: DC | PRN
  Administered 2017-09-08 (×2): 650 mg via ORAL
  Filled 2017-09-07 (×2): qty 2

## 2017-09-07 MED ORDER — LIDOCAINE 2% (20 MG/ML) 5 ML SYRINGE
INTRAMUSCULAR | Status: AC
  Filled 2017-09-07: qty 5

## 2017-09-07 MED ORDER — ROCURONIUM BROMIDE 10 MG/ML (PF) SYRINGE
PREFILLED_SYRINGE | INTRAVENOUS | Status: DC | PRN
  Administered 2017-09-07 (×4): 20 mg via INTRAVENOUS
  Administered 2017-09-07: 40 mg via INTRAVENOUS

## 2017-09-07 MED ORDER — SUGAMMADEX SODIUM 200 MG/2ML IV SOLN
INTRAVENOUS | Status: AC
  Filled 2017-09-07: qty 2

## 2017-09-07 MED ORDER — PHENYLEPHRINE HCL 10 MG/ML IJ SOLN
INTRAVENOUS | Status: DC | PRN
  Administered 2017-09-07: 30 ug/min via INTRAVENOUS

## 2017-09-07 MED ORDER — THROMBIN (RECOMBINANT) 20000 UNITS EX SOLR
CUTANEOUS | Status: DC | PRN
  Administered 2017-09-07: 07:00:00 via TOPICAL

## 2017-09-07 MED ORDER — DIPHENHYDRAMINE HCL 25 MG PO CAPS
25.0000 mg | ORAL_CAPSULE | Freq: Four times a day (QID) | ORAL | Status: DC | PRN
  Administered 2017-09-08: 25 mg via ORAL
  Filled 2017-09-07 (×3): qty 1

## 2017-09-07 MED ORDER — FENOFIBRATE 160 MG PO TABS
160.0000 mg | ORAL_TABLET | Freq: Every day | ORAL | Status: DC
  Administered 2017-09-08 – 2017-09-10 (×3): 160 mg via ORAL
  Filled 2017-09-07 (×3): qty 1

## 2017-09-07 MED ORDER — METHOCARBAMOL 1000 MG/10ML IJ SOLN: 500.0000 mg | Freq: Four times a day (QID) | INTRAVENOUS | Status: DC | PRN

## 2017-09-07 MED ORDER — PHENYLEPHRINE 40 MCG/ML (10ML) SYRINGE FOR IV PUSH (FOR BLOOD PRESSURE SUPPORT)
PREFILLED_SYRINGE | INTRAVENOUS | Status: AC
  Filled 2017-09-07: qty 10

## 2017-09-07 MED ORDER — 0.9 % SODIUM CHLORIDE (POUR BTL) OPTIME
TOPICAL | Status: DC | PRN
  Administered 2017-09-07: 1000 mL

## 2017-09-07 MED ORDER — METHOCARBAMOL 500 MG PO TABS
500.0000 mg | ORAL_TABLET | Freq: Four times a day (QID) | ORAL | Status: DC | PRN
  Administered 2017-09-07 – 2017-09-10 (×7): 500 mg via ORAL
  Filled 2017-09-07 (×7): qty 1

## 2017-09-07 MED ORDER — SODIUM CHLORIDE 0.9 % IV SOLN
INTRAVENOUS | Status: DC | PRN
  Administered 2017-09-07: 07:00:00

## 2017-09-07 MED ORDER — LIDOCAINE-EPINEPHRINE 1 %-1:100000 IJ SOLN
INTRAMUSCULAR | Status: AC
  Filled 2017-09-07: qty 1

## 2017-09-07 MED ORDER — METFORMIN HCL 500 MG PO TABS
1000.0000 mg | ORAL_TABLET | Freq: Every day | ORAL | Status: DC
  Administered 2017-09-07 – 2017-09-09 (×3): 1000 mg via ORAL
  Filled 2017-09-07 (×3): qty 2

## 2017-09-07 MED ORDER — DEXAMETHASONE SODIUM PHOSPHATE 10 MG/ML IJ SOLN
INTRAMUSCULAR | Status: DC | PRN
  Administered 2017-09-07: 5 mg via INTRAVENOUS

## 2017-09-07 MED ORDER — SODIUM CHLORIDE 0.9 % IV SOLN
10.0000 mL/h | Freq: Once | INTRAVENOUS | Status: AC
  Administered 2017-09-07: 11:00:00 via INTRAVENOUS

## 2017-09-07 MED ORDER — DOCUSATE SODIUM 100 MG PO CAPS
100.0000 mg | ORAL_CAPSULE | Freq: Two times a day (BID) | ORAL | Status: DC
  Administered 2017-09-07 – 2017-09-10 (×7): 100 mg via ORAL
  Filled 2017-09-07 (×7): qty 1

## 2017-09-07 MED ORDER — HYDROCHLOROTHIAZIDE 25 MG PO TABS
25.0000 mg | ORAL_TABLET | Freq: Every day | ORAL | Status: DC
  Administered 2017-09-09: 25 mg via ORAL
  Filled 2017-09-07 (×2): qty 1

## 2017-09-07 MED ORDER — CARBOXYMETHYLCELLULOSE SODIUM 0.5 % OP SOLN: 1.0000 [drp] | Freq: Three times a day (TID) | OPHTHALMIC | Status: DC | PRN

## 2017-09-07 MED ORDER — PROPOFOL 10 MG/ML IV BOLUS
INTRAVENOUS | Status: DC | PRN
  Administered 2017-09-07: 110 mg via INTRAVENOUS

## 2017-09-07 MED ORDER — BUPIVACAINE HCL (PF) 0.5 % IJ SOLN
INTRAMUSCULAR | Status: DC | PRN
  Administered 2017-09-07: 10 mL

## 2017-09-07 MED ORDER — FLEET ENEMA 7-19 GM/118ML RE ENEM: 1.0000 | ENEMA | Freq: Once | RECTAL | Status: DC | PRN

## 2017-09-07 MED ORDER — PROMETHAZINE HCL 25 MG RE SUPP: 25.0000 mg | RECTAL | Status: DC | PRN

## 2017-09-07 MED ORDER — FUROSEMIDE 40 MG PO TABS: 40.0000 mg | ORAL_TABLET | Freq: Every day | ORAL | Status: DC

## 2017-09-07 MED ORDER — BUPIVACAINE HCL (PF) 0.5 % IJ SOLN
INTRAMUSCULAR | Status: AC
  Filled 2017-09-07: qty 30

## 2017-09-07 MED ORDER — PHENOL 1.4 % MT LIQD
1.0000 | OROMUCOSAL | Status: DC | PRN
  Administered 2017-09-09: 1 via OROMUCOSAL
  Filled 2017-09-07: qty 177

## 2017-09-07 MED ORDER — POTASSIUM CHLORIDE CRYS ER 20 MEQ PO TBCR: 20.0000 meq | EXTENDED_RELEASE_TABLET | Freq: Every day | ORAL | Status: DC

## 2017-09-07 MED ORDER — ALBUMIN HUMAN 5 % IV SOLN
INTRAVENOUS | Status: DC | PRN
  Administered 2017-09-07: 10:00:00 via INTRAVENOUS

## 2017-09-07 MED ORDER — ALUM & MAG HYDROXIDE-SIMETH 200-200-20 MG/5ML PO SUSP: 30.0000 mL | Freq: Four times a day (QID) | ORAL | Status: DC | PRN

## 2017-09-07 MED ORDER — OXYCODONE HCL 5 MG PO TABS
10.0000 mg | ORAL_TABLET | ORAL | Status: DC | PRN
  Administered 2017-09-07 (×2): 10 mg via ORAL
  Administered 2017-09-08: 5 mg via ORAL
  Administered 2017-09-08 (×4): 10 mg via ORAL
  Filled 2017-09-07 (×7): qty 2

## 2017-09-07 MED ORDER — CEFAZOLIN SODIUM-DEXTROSE 2-4 GM/100ML-% IV SOLN
2.0000 g | INTRAVENOUS | Status: AC
  Administered 2017-09-07: 2 g via INTRAVENOUS
  Filled 2017-09-07: qty 100

## 2017-09-07 MED ORDER — SODIUM CHLORIDE 0.9% FLUSH
3.0000 mL | Freq: Two times a day (BID) | INTRAVENOUS | Status: DC
  Administered 2017-09-07 – 2017-09-09 (×3): 3 mL via INTRAVENOUS

## 2017-09-07 MED ORDER — METFORMIN HCL 500 MG PO TABS: 500.0000 mg | ORAL_TABLET | Freq: Two times a day (BID) | ORAL | Status: DC

## 2017-09-07 MED ORDER — SUCCINYLCHOLINE CHLORIDE 200 MG/10ML IV SOSY
PREFILLED_SYRINGE | INTRAVENOUS | Status: AC
  Filled 2017-09-07: qty 10

## 2017-09-07 MED ORDER — DEXAMETHASONE SODIUM PHOSPHATE 10 MG/ML IJ SOLN
INTRAMUSCULAR | Status: AC
  Filled 2017-09-07: qty 1

## 2017-09-07 MED ORDER — BISACODYL 10 MG RE SUPP: 10.0000 mg | Freq: Every day | RECTAL | Status: DC | PRN

## 2017-09-07 MED ORDER — GLYCOPYRROLATE 0.2 MG/ML IJ SOLN
INTRAMUSCULAR | Status: DC | PRN
  Administered 2017-09-07: 0.2 mg via INTRAVENOUS

## 2017-09-07 MED ORDER — POLYVINYL ALCOHOL 1.4 % OP SOLN: 1.0000 [drp] | Freq: Three times a day (TID) | OPHTHALMIC | Status: DC | PRN

## 2017-09-07 SURGICAL SUPPLY — 69 items
ADH SKN CLS APL DERMABOND .7 (GAUZE/BANDAGES/DRESSINGS) ×1
APL SRG 60D 8 XTD TIP BNDBL (TIP)
BAG DECANTER FOR FLEXI CONT (MISCELLANEOUS) ×3 IMPLANT
BASKET BONE COLLECTION (BASKET) ×3 IMPLANT
BLADE CLIPPER SURG (BLADE) IMPLANT
BONE CANC CHIPS 20CC PCAN1/4 (Bone Implant) ×3 IMPLANT
BUR MATCHSTICK NEURO 3.0 LAGG (BURR) ×3 IMPLANT
CANISTER SUCT 3000ML PPV (MISCELLANEOUS) ×3 IMPLANT
CAP LOCKING CREO THRD (Cap) IMPLANT
CEMENT KYPHON C01A KIT/MIXER (Cement) ×2 IMPLANT
CHIPS CANC BONE 20CC PCAN1/4 (Bone Implant) ×1 IMPLANT
CONT SPEC 4OZ CLIKSEAL STRL BL (MISCELLANEOUS) ×3 IMPLANT
COVER BACK TABLE 60X90IN (DRAPES) ×3 IMPLANT
DECANTER SPIKE VIAL GLASS SM (MISCELLANEOUS) ×3 IMPLANT
DERMABOND ADVANCED (GAUZE/BANDAGES/DRESSINGS) ×2
DERMABOND ADVANCED .7 DNX12 (GAUZE/BANDAGES/DRESSINGS) ×1 IMPLANT
DEVICE DISSECT PLASMABLAD 3.0S (MISCELLANEOUS) ×1 IMPLANT
DRAPE C-ARM 42X72 X-RAY (DRAPES) ×6 IMPLANT
DRAPE HALF SHEET 40X57 (DRAPES) ×4 IMPLANT
DRAPE LAPAROTOMY 100X72X124 (DRAPES) ×3 IMPLANT
DRAPE WARM FLUID 44X44 (DRAPE) ×2 IMPLANT
DRIVER SHAFT CREO (MISCELLANEOUS) ×8 IMPLANT
DRSG OPSITE POSTOP 4X6 (GAUZE/BANDAGES/DRESSINGS) ×2 IMPLANT
DURAPREP 26ML APPLICATOR (WOUND CARE) ×3 IMPLANT
DURASEAL APPLICATOR TIP (TIP) IMPLANT
DURASEAL SPINE SEALANT 3ML (MISCELLANEOUS) IMPLANT
ELECT REM PT RETURN 9FT ADLT (ELECTROSURGICAL) ×3
ELECTRODE REM PT RTRN 9FT ADLT (ELECTROSURGICAL) ×1 IMPLANT
GAUZE SPONGE 4X4 12PLY STRL (GAUZE/BANDAGES/DRESSINGS) ×3 IMPLANT
GAUZE SPONGE 4X4 16PLY XRAY LF (GAUZE/BANDAGES/DRESSINGS) IMPLANT
GLOVE BIOGEL PI IND STRL 8.5 (GLOVE) ×2 IMPLANT
GLOVE BIOGEL PI INDICATOR 8.5 (GLOVE) ×4
GLOVE ECLIPSE 8.5 STRL (GLOVE) ×6 IMPLANT
GLOVE INDICATOR 7.0 STRL GRN (GLOVE) ×4 IMPLANT
GLOVE INDICATOR 7.5 STRL GRN (GLOVE) ×2 IMPLANT
GOWN STRL REUS W/ TWL LRG LVL3 (GOWN DISPOSABLE) IMPLANT
GOWN STRL REUS W/ TWL XL LVL3 (GOWN DISPOSABLE) IMPLANT
GOWN STRL REUS W/TWL 2XL LVL3 (GOWN DISPOSABLE) ×6 IMPLANT
GOWN STRL REUS W/TWL LRG LVL3 (GOWN DISPOSABLE)
GOWN STRL REUS W/TWL XL LVL3 (GOWN DISPOSABLE) ×3
GRAFT BNE CANC CHIPS 1-8 20CC (Bone Implant) IMPLANT
HEMOSTAT POWDER KIT SURGIFOAM (HEMOSTASIS) ×2 IMPLANT
KIT BASIN OR (CUSTOM PROCEDURE TRAY) ×3 IMPLANT
KIT INFUSE SMALL (Orthopedic Implant) ×2 IMPLANT
KIT TURNOVER KIT B (KITS) ×3 IMPLANT
LOCKING CAP CREO THRD (Cap) ×12 IMPLANT
MILL MEDIUM DISP (BLADE) ×3 IMPLANT
NEEDLE HYPO 22GX1.5 SAFETY (NEEDLE) ×3 IMPLANT
NS IRRIG 1000ML POUR BTL (IV SOLUTION) ×3 IMPLANT
PACK AFFIRM FILLER DELIVERY (MISCELLANEOUS) ×2 IMPLANT
PACK LAMINECTOMY NEURO (CUSTOM PROCEDURE TRAY) ×3 IMPLANT
PAD ARMBOARD 7.5X6 YLW CONV (MISCELLANEOUS) ×9 IMPLANT
PATTIES SURGICAL .5 X1 (DISPOSABLE) ×1 IMPLANT
PLASMABLADE 3.0S (MISCELLANEOUS) ×3
ROD SPINAL 35MM (Rod) ×4 IMPLANT
SCREW CREO FEN 6.5X45MM POLY (Screw) ×8 IMPLANT
SPACER SUSTAIN RT 8X26X11 8D (Spacer) ×4 IMPLANT
SPONGE LAP 4X18 X RAY DECT (DISPOSABLE) IMPLANT
SPONGE SURGIFOAM ABS GEL 100 (HEMOSTASIS) ×3 IMPLANT
SUT PROLENE 6 0 BV (SUTURE) IMPLANT
SUT VIC AB 1 CT1 18XBRD ANBCTR (SUTURE) ×1 IMPLANT
SUT VIC AB 1 CT1 8-18 (SUTURE) ×3
SUT VIC AB 2-0 CP2 18 (SUTURE) ×3 IMPLANT
SUT VIC AB 3-0 SH 8-18 (SUTURE) ×5 IMPLANT
SYR 3ML LL SCALE MARK (SYRINGE) ×12 IMPLANT
TOWEL GREEN STERILE (TOWEL DISPOSABLE) ×3 IMPLANT
TOWEL GREEN STERILE FF (TOWEL DISPOSABLE) ×3 IMPLANT
TRAY FOLEY W/METER SILVER 16FR (SET/KITS/TRAYS/PACK) ×3 IMPLANT
WATER STERILE IRR 1000ML POUR (IV SOLUTION) ×3 IMPLANT

## 2017-09-07 NOTE — Transfer of Care (Signed)
Immediate Anesthesia Transfer of Care Note  Patient: Gloria [name]  Procedure(s) Performed: Lumbar four-five Laminectomy/Decompression/Posterior lumbar interbody fusion/Peek spacers/infuse (N/A Back)  Patient Location: PACU  Anesthesia Type:General  Level of Consciousness: awake, alert , oriented and patient cooperative  Airway & Oxygen Therapy: Patient Spontanous Breathing and Patient connected to nasal cannula oxygen  Post-op Assessment: Report given to RN, Post -op Vital signs reviewed and stable and Patient moving all extremities X 4  Post vital signs: Reviewed and stable  Last Vitals:  Vitals Value Taken Time  BP 155/51 09/07/2017 11:33 AM  Temp 36.5 C 09/07/2017 11:34 AM  Pulse 60 09/07/2017 11:36 AM  Resp 17 09/07/2017 11:36 AM  SpO2 95 % 09/07/2017 11:36 AM  Vitals shown include unvalidated device data.  Last Pain: There were no vitals filed for this visit.    Patients Stated Pain Goal: 2 (49/82/64 1583)  Complications: No apparent anesthesia complications

## 2017-09-07 NOTE — Progress Notes (Signed)
Patient ID: Gloria [name], female   DOB: 30-Nov-1940, 77 y.o.   MRN: [id number] Vital signs are stable Patient not complaining of any nausea Reasonably comfortable Still marked weakness in the right tibialis anterior Stable postop

## 2017-09-07 NOTE — H&P (Addendum)
Gloria [name] is an 77 y.o. female.   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  She underwent surgical decompression a number of years ago however she now has developed increasing pain and now weakness with foot drop on the right side.  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  She has some modest adjacent level disease but a singular worst level of problem for her is at L4-L5.  She has failed efforts at conservative management and because of the weakness is getting substantially worse she has been advised regarding the need for surgical decompression and stabilization.  Past Medical History:  Diagnosis Date  . Arthritis    fingers  . Atrial fibrillation ([facility])   . Carotid artery occlusion   . CKD (chronic kidney disease), stage III ([facility])   . Diabetes mellitus   . Diabetic retinopathy ([facility])   . Dysrhythmia    afib  . Family history of adverse reaction to anesthesia    daughter - violent  . GERD (gastroesophageal reflux disease)   . Hyperlipidemia   . Hypertension   . Macular degeneration    Right eye only  . Peripheral vascular disease ([facility])    hx rt carotid s/p  . Pneumonia    hx, "a long time ago"  . PONV (postoperative nausea and vomiting)   . Sleep apnea     Past Surgical History:  Procedure Laterality Date  . CARDIOVERSION N/A 06/07/2017   Procedure: CARDIOVERSION;  Surgeon: Thompson Grayer, MD;  Location: [facility];  Service: Cardiovascular;  Laterality: N/A;  . CAROTID ENDARTERECTOMY  Jan. 19, 2007   Right  cea  . CHOLECYSTECTOMY  1968  . EYE SURGERY Right    cataract.  Retinal tear- right eye  . LUMBAR LAMINECTOMY/DECOMPRESSION MICRODISCECTOMY  02/27/2012   Procedure: LUMBAR LAMINECTOMY/DECOMPRESSION MICRODISCECTOMY 2 LEVELS;  Surgeon: Kristeen Miss, MD;   Location: [facility];  Service: Neurosurgery;  Laterality: N/A;  Lumbar two-three, four-five Laminectomy/foraminotomy  . SPINAL CORD DECOMPRESSION  Oct. 2013    Family History  Problem Relation Age of Onset  . Heart disease Mother        Heart Disease before age 10  . Heart disease Father   . Heart disease Sister        Heart Disease before age 67  . Breast cancer Daughter 70   Social History:  reports that she quit smoking about 13 years ago. Her smoking use included cigarettes. She has a 8.00 pack-year smoking history. She has never used smokeless tobacco. She reports that she does not drink alcohol or use drugs.  Allergies:  Allergies  Allergen Reactions  . Iodinated Diagnostic Agents Anaphylaxis  . Hibiclens [Chlorhexidine Gluconate]   . Iohexol Hives and Swelling     Desc: ANGIO EDEMA W/ ANAPHYLAXIS PRIOR... HIVES POST 13 HR PREP...OK/A.C.   . Levaquin [Levofloxacin In D5w] Nausea Only and Other (See Comments)    dizziness  . Oxycodone-Acetaminophen Nausea And Vomiting  . Propoxyphene N-Acetaminophen Nausea And Vomiting    Medications Prior to Admission  Medication Sig Dispense Refill  . amiodarone (PACERONE) 200 MG tablet Take 1 tablet (200 mg total) by mouth daily. (Patient taking differently: Take 200 mg by mouth 2 (two) times daily. ) 30 tablet 3  . atorvastatin (LIPITOR) 80 MG tablet Take 80 mg  by mouth daily.    . carboxymethylcellulose (REFRESH PLUS) 0.5 % SOLN Place 1 drop into both eyes 3 (three) times daily as needed (for dry/irritated eyes.).     Marland Kitchen diphenhydrAMINE (BENADRYL) 25 MG tablet Take 25 mg by mouth every 6 (six) hours as needed for allergies.    Marland Kitchen ELIQUIS 5 MG TABS tablet TAKE 1 TABLET BY MOUTH TWICE A DAY 60 tablet 6  . fenofibrate 160 MG tablet Take 160 mg by mouth daily.    . furosemide (LASIX) 40 MG tablet Take one tablet every other day for one week 4 tablet 0  . HYDROcodone-acetaminophen (NORCO/VICODIN) 5-325 MG per tablet Take 1-2 tablets by  mouth every 4 (four) hours as needed for pain. 60 tablet 0  . JANUVIA 100 MG tablet Take 100 mg by mouth daily.  1  . LANTUS SOLOSTAR 100 UNIT/ML Solostar Pen Inject 5 Units into the skin at bedtime.     . lidocaine (LIDODERM) 5 % Place 1 patch onto the skin daily as needed (Back Pain). Remove & Discard patch within 12 hours or as directed by MD     . Menthol, Topical Analgesic, (BIOFREEZE EX) Apply 1 application topically as needed (Back and Knee pain).    . metFORMIN (GLUCOPHAGE) 500 MG tablet Take 500-1,000 mg by mouth 2 (two) times daily. Take 1 tablet (500 mg) by mouth in the morning & Take 2 tablets (1000 mg) by mouth in the evening.    . metoprolol tartrate (LOPRESSOR) 25 MG tablet Take 1 tablet (25 mg total) by mouth 2 (two) times daily. (Patient taking differently: Take 50 mg by mouth 2 (two) times daily. ) 60 tablet 1  . Multiple Vitamins-Minerals (PRESERVISION AREDS 2 PO) Take 1 tablet by mouth 2 (two) times daily.    Marland Kitchen omeprazole (PRILOSEC) 20 MG capsule Take 20 mg by mouth daily before breakfast.     . potassium chloride SA (K-DUR,KLOR-CON) 20 MEQ tablet Take one tablet every other day for one week 4 tablet 0  . promethazine (PHENERGAN) 25 MG suppository Place 25 mg rectally every 4 (four) hours as needed for nausea or vomiting.    . traMADol (ULTRAM) 50 MG tablet Take 50-100 mg by mouth every 6 (six) hours as needed for pain.  0  . amiodarone (PACERONE) 200 MG tablet Take 1 tablet (200 mg total) by mouth daily. 30 tablet 3  . aspirin EC 81 MG EC tablet Take 1 tablet (81 mg total) by mouth daily. 30 tablet 0  . hydrochlorothiazide (HYDRODIURIL) 25 MG tablet Take 25 mg by mouth daily.    Marland Kitchen losartan (COZAAR) 25 MG tablet Take 25 mg by mouth daily.      Results for orders placed or performed during the hospital encounter of 09/07/17 (from the past 48 hour(s))  Glucose, capillary     Status: Abnormal   Collection Time: 09/07/17  6:05 AM  Result Value Ref Range   Glucose-Capillary 118  (H) 65 - 99 mg/dL   Comment 1 Notify RN    No results found.  Review of Systems  Constitutional: Positive for malaise/fatigue.  HENT: Negative.   Eyes: Negative.   Respiratory: Negative.   Cardiovascular: Negative.   Gastrointestinal: Negative.   Genitourinary: Negative.   Musculoskeletal: Positive for back pain.  Skin: Negative.   Neurological: Positive for sensory change and focal weakness.  Endo/Heme/Allergies:       History of diabetes was previously insulin-dependent.  Psychiatric/Behavioral: Negative.     Blood pressure Marland Kitchen)  174/50, temperature 98.1 F (36.7 C), resp. rate 17, SpO2 99 %. Physical Exam  Constitutional: She is oriented to person, place, and time. She appears well-developed and well-nourished.  HENT:  Head: Normocephalic and atraumatic.  Eyes: Pupils are equal, round, and reactive to light. Conjunctivae and EOM are normal.  Neck: Normal range of motion. Neck supple.  Cardiovascular: Normal rate.  Respiratory: Effort normal and breath sounds normal.  GI: Soft. Bowel sounds are normal.  Musculoskeletal:  Positive straight leg raising in either lower extremity at 30 degrees Patrick's maneuver is negative bilaterally.  Neurological: She is alert and oriented to person, place, and time.  Absent deep tendon reflexes in both patella and the Achilles.  Marked weakness in tibialis anterior on the left.  Modest weakness in tibialis anterior on the right.  Right is graded 0 out of 5 left is graded at 3 out of 5.  Modest gluteal weakness bilaterally also.  Graded 4 out of 5  Skin: Skin is warm and dry.  Psychiatric: She has a normal mood and affect. Her behavior is normal. Judgment and thought content normal.     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  Plan: Decompression and fusion L4-L5.  Earleen Newport, MD 09/07/2017, 7:23 AM

## 2017-09-07 NOTE — Progress Notes (Signed)
Pt brought mask but didn'[t bring attachment to fit our circuit and did not want to use our mask.  RT left machine in room on standby at this time.  Pt will get her mask and circuit delivered here tom and use our machine.  Rt will continue to monitor.

## 2017-09-07 NOTE — Op Note (Signed)
Date of surgery: 09/07/2017 Preoperative diagnosis: L4-L5 spondylolisthesis with stenosis and radiculopathy, right-sided foot drop. Postoperative diagnosis: Same Procedure: Bilateral laminotomies and decompression of the L4 and L5 nerve roots.  Posterior lumbar interbody arthrodesis with peek spacers local autograft and allograft.  Pedicle screw fixation L4-L5 with posterior lateral arthrodesis using local autograft allograft and infuse.  Methacrylate augmentation of pedicle screws L4 and L5. Surgeon: Kristeen Miss First assistant: Willow Ora MD Indications: Gloria [name] is a 77 year old individuals had significant back and bilateral lower extremity pain with weakness in the right leg including a foot drop.  She has evidence of severe spondylitic stenosis and was advised sometime ago to undergo surgical decompression.  Foot drop is now progressed substantially.  Procedure patient was brought to the operating room supine on the stretcher.  After the smooth induction of general endotracheal anesthesia, she was turned prone.  The back was prepped with alcohol DuraPrep and draped in a sterile fashion.  Midline incision was created and carried down to the lumbodorsal fascia and L4-L5 was identified positively with a series of radiographs.  Then large laminotomies were created removing the inferior marginal lamina of L4 out to the entirety of the facet at L4-5.  This bone was used for grafting.  Then the thick and redundant yellow ligament in this region was taken up in the common dural tube in the past the L4 nerve root superiorly was dissected and decompressed.  The L5 nerve root inferiorly was then decompressed by removing severely hypertrophied facet joint from the superior articular process of L5.  Once the nerve root and the common dural tube could be fully mobilized the space was noted to be severely degenerated.  It was incised with a 15 blade and a combination of curettes and rongeurs were used to  complete a total discectomy at L4-L5.  This was performed with the help of distractors maintain at the space open.  The endplates were curettaged smoothed to allow removal of all the articular cartilage in this region.  Then the interspace was sized for an 11 mm tall 8 degree lordotic spacer that was filled with autograft allograft and infuse.  A total of 9 cc of autograft allograft and infuse was packed into the interspace along with a 2 spacers.  Pedicle screws were then placed under fluoroscopic guidance at L4 and L5 using the fluoroscopic guidance to place the screws.  Towers were attached to the screws and then at L4 augmentation with methacrylate cement was performed under fluoroscopic guidance a total of 1/2 cc of methacrylate was injected into the vertebral body of L4 on both screw levels.  Similar augmentation was performed at L5 and again 1/2 cc of cement was placed into the pedicle screw at L5.  Radiographic verification AP and lateral projections was obtained.  When this was noted to be good 35 mm precontoured rods was used to filled with the remainder of autograft allograft and infuse.  Once this was accomplished care was taken to make sure the common dural tube and the individual nerve roots were well decompressed connect L4 and L5.  This was done in a neutral construct.  Final radiographs were obtained in AP and lateral projection.  Lateral gutters which were previously packed off for than once hemostasis was also checked in the lumbodorsal fascia was closed with #1 Vicryl in interrupted fashion 2-0 Vicryl was used in the subcutaneous tissues and 3-0 Vicryl to close subcuticularly.  Dermabond was placed on the skin.  Blood loss is  estimated at 400 cc.  One unit of packed red blood cells was transfused to the patient during the procedure.

## 2017-09-07 NOTE — Anesthesia Procedure Notes (Signed)
Procedure Name: Intubation Date/Time: 09/07/2017 7:42 AM Performed by: Julieta Bellini, CRNA Pre-anesthesia Checklist: Patient identified, Emergency Drugs available, Suction available and Patient being monitored Patient Re-evaluated:Patient Re-evaluated prior to induction Oxygen Delivery Method: Circle system utilized Preoxygenation: Pre-oxygenation with 100% oxygen Induction Type: IV induction Ventilation: Mask ventilation without difficulty and Oral airway inserted - appropriate to patient size Laryngoscope Size: Mac and 3 Grade View: Grade I Tube type: Oral Tube size: 7.0 mm Number of attempts: 1 Airway Equipment and Method: Stylet Placement Confirmation: positive ETCO2,  breath sounds checked- equal and bilateral and ETT inserted through vocal cords under direct vision Secured at: 21 cm Tube secured with: Tape Dental Injury: Teeth and Oropharynx as per pre-operative assessment

## 2017-09-07 NOTE — Evaluation (Signed)
Physical Therapy Evaluation Patient Details Name: Gloria [name] MRN: [id number] DOB: 1941/04/01 Today's Date: 09/07/2017   History of Present Illness  Pt is a 77 y/o female who presents s/p L4-L5 PLIF on 09/07/17. PMH significant for PNA, PVD, macular degeneration, HTN, a-fib, DM, CKD III, carotid artery occlusion.  Clinical Impression  Pt admitted with above diagnosis. Pt currently with functional limitations due to the deficits listed below (see PT Problem List). At the time of PT eval pt was able to perform transfers and ambulation with gross min guard assist to min assist for balance support and safety. Pt will have family support available upon d/c, and anticipate she will progress well with post-op mobility. Acutely, pt will benefit from skilled PT to increase their independence and safety with mobility to allow discharge to the venue listed below.       Follow Up Recommendations No PT follow up;Supervision for mobility/OOB    Equipment Recommendations  Rolling walker with 5" wheels    Recommendations for Other Services       Precautions / Restrictions Precautions Precautions: Fall;Back Precaution Booklet Issued: Yes (comment) Precaution Comments: Reviewed handout with pt and daughter Required Braces or Orthoses: Spinal Brace Spinal Brace: Lumbar corset;Applied in sitting position Restrictions Weight Bearing Restrictions: No      Mobility  Bed Mobility Overal bed mobility: Needs Assistance Bed Mobility: Rolling;Sidelying to Sit;Sit to Sidelying Rolling: Min guard Sidelying to sit: Min assist     Sit to sidelying: Min assist General bed mobility comments: VC's for log roll technique. Pt required assist for trunk elevation to full sitting position and again to elevate LE's back up into bed at end of session.   Transfers Overall transfer level: Needs assistance Equipment used: Rolling walker (2 wheeled) Transfers: Sit to/from Stand Sit to Stand: Min assist          General transfer comment: VC's for hand placement on seated surface for safety. Pt was able to power-up to full stand with min assist.   Ambulation/Gait Ambulation/Gait assistance: Min guard Ambulation Distance (Feet): 100 Feet Assistive device: Rolling walker (2 wheeled) Gait Pattern/deviations: Step-through pattern;Decreased stride length;Trunk flexed;Decreased dorsiflexion - right Gait velocity: Decreased Gait velocity interpretation: <1.31 ft/sec, indicative of household ambulator General Gait Details: VC's for improved posture and general safety with the RW. Overall pt moving slow but fairly steady with the RW.   Stairs            Wheelchair Mobility    Modified Rankin (Stroke Patients Only)       Balance Overall balance assessment: Needs assistance Sitting-balance support: Feet supported;No upper extremity supported Sitting balance-Leahy Scale: Fair     Standing balance support: No upper extremity supported;During functional activity Standing balance-Leahy Scale: Poor Standing balance comment: Reliant on UE support for safety.                              Pertinent Vitals/Pain Pain Assessment: 0-10 Pain Score: 5  Pain Location: Incision site Pain Descriptors / Indicators: Operative site guarding;Discomfort Pain Intervention(s): Limited activity within patient's tolerance;Monitored during session;Repositioned    Home Living Family/patient expects to be discharged to:: Private residence Living Arrangements: Children Available Help at Discharge: Family;Available 24 hours/day Type of Home: House Home Access: Level entry     Home Layout: Two level;Able to live on main level with bedroom/bathroom Home Equipment: Shower seat - built in;Grab bars - tub/shower;Hand held shower head;Adaptive equipment  Prior Function Level of Independence: Independent with assistive device(s)         Comments: Has been using the cane recently. Works as  a Acupuncturist Extremity Assessment Upper Extremity Assessment: Defer to OT evaluation    Lower Extremity Assessment Lower Extremity Assessment: Generalized weakness;RLE deficits/detail RLE Deficits / Details: Decreased strength and AROM consistent with pre-op diagnosis. Foot drop noted. RLE Sensation: WNL    Cervical / Trunk Assessment Cervical / Trunk Assessment: Other exceptions Cervical / Trunk Exceptions: s/p spinal surgery  Communication   Communication: No difficulties  Cognition Arousal/Alertness: Awake/alert Behavior During Therapy: WFL for tasks assessed/performed Overall Cognitive Status: Within Functional Limits for tasks assessed                                        General Comments      Exercises     Assessment/Plan    PT Assessment Patient needs continued PT services  PT Problem List Decreased strength;Decreased range of motion;Decreased activity tolerance;Decreased balance;Decreased mobility;Decreased knowledge of use of DME;Decreased safety awareness;Decreased knowledge of precautions;Pain       PT Treatment Interventions DME instruction;Gait training;Stair training;Functional mobility training;Therapeutic activities;Therapeutic exercise;Neuromuscular re-education;Patient/family education    PT Goals (Current goals can be found in the Care Plan section)  Acute Rehab PT Goals Patient Stated Goal: Get better PT Goal Formulation: With patient/family Time For Goal Achievement: 09/14/17 Potential to Achieve Goals: Good    Frequency Min 5X/week   Barriers to discharge        Co-evaluation               AM-PAC PT "6 Clicks" Daily Activity  Outcome Measure Difficulty turning over in bed (including adjusting bedclothes, sheets and blankets)?: Unable Difficulty moving from lying on back to sitting on the side of the bed? : Unable Difficulty sitting down on and standing  up from a chair with arms (e.g., wheelchair, bedside commode, etc,.)?: Unable Help needed moving to and from a bed to chair (including a wheelchair)?: A Little Help needed walking in hospital room?: A Little Help needed climbing 3-5 steps with a railing? : A Lot 6 Click Score: 11    End of Session Equipment Utilized During Treatment: Gait belt;Back brace Activity Tolerance: Patient tolerated treatment well Patient left: in bed;with call bell/phone within reach;with family/visitor present;with nursing/sitter in room;with SCD's reapplied Nurse Communication: Mobility status PT Visit Diagnosis: Unsteadiness on feet (R26.81);Pain Pain - part of body: (back)    Time: 7741-2878 PT Time Calculation (min) (ACUTE ONLY): 33 min   Charges:   PT Evaluation $PT Eval Moderate Complexity: 1 Mod PT Treatments $Gait Training: 8-22 mins   PT G Codes:        [name], PT, DPT Acute Rehabilitation Services Pager: [phone]   Thelma Comp 09/07/2017, 3:02 PM

## 2017-09-07 NOTE — Anesthesia Postprocedure Evaluation (Signed)
Anesthesia Post Note  Patient: Gloria [name]  Procedure(s) Performed: Lumbar four-five Laminectomy/Decompression/Posterior lumbar interbody fusion/Peek spacers/infuse (N/A Back)     Patient location during evaluation: PACU Anesthesia Type: General Level of consciousness: awake Pain management: pain level controlled Vital Signs Assessment: post-procedure vital signs reviewed and stable Respiratory status: spontaneous breathing Cardiovascular status: stable Postop Assessment: no apparent nausea or vomiting Anesthetic complications: no    Last Vitals:  Vitals:   09/07/17 1320 09/07/17 1348  BP:  (!) 145/47  Pulse:  (!) 58  Resp:  16  Temp: 37 C 36.5 C  SpO2:  93%    Last Pain:  Vitals:   09/07/17 1348  TempSrc: Oral  PainSc:    Pain Goal: Patients Stated Pain Goal: 2 (09/07/17 0640)               Haywood Meinders JR,JOHN Mateo Flow

## 2017-09-08 LAB — GLUCOSE, CAPILLARY
GLUCOSE-CAPILLARY: 205 mg/dL — AB (ref 65–99)
Glucose-Capillary: 137 mg/dL — ABNORMAL HIGH (ref 65–99)
Glucose-Capillary: 156 mg/dL — ABNORMAL HIGH (ref 65–99)
Glucose-Capillary: 160 mg/dL — ABNORMAL HIGH (ref 65–99)

## 2017-09-08 LAB — CBC
HEMATOCRIT: 28.8 % — AB (ref 36.0–46.0)
Hemoglobin: 9.2 g/dL — ABNORMAL LOW (ref 12.0–15.0)
MCH: 29.2 pg (ref 26.0–34.0)
MCHC: 31.9 g/dL (ref 30.0–36.0)
MCV: 91.4 fL (ref 78.0–100.0)
Platelets: 204 10*3/uL (ref 150–400)
RBC: 3.15 MIL/uL — ABNORMAL LOW (ref 3.87–5.11)
RDW: 18 % — AB (ref 11.5–15.5)
WBC: 8.5 10*3/uL (ref 4.0–10.5)

## 2017-09-08 LAB — BASIC METABOLIC PANEL
ANION GAP: 8 (ref 5–15)
BUN: 28 mg/dL — ABNORMAL HIGH (ref 6–20)
CO2: 27 mmol/L (ref 22–32)
CREATININE: 1.48 mg/dL — AB (ref 0.44–1.00)
Calcium: 8.2 mg/dL — ABNORMAL LOW (ref 8.9–10.3)
Chloride: 103 mmol/L (ref 101–111)
GFR, EST AFRICAN AMERICAN: 38 mL/min — AB (ref >60)
GFR, EST NON AFRICAN AMERICAN: 33 mL/min — AB (ref >60)
Glucose, Bld: 145 mg/dL — ABNORMAL HIGH (ref 65–99)
Potassium: 4 mmol/L (ref 3.5–5.1)
Sodium: 138 mmol/L (ref 135–145)

## 2017-09-08 MED FILL — Thrombin For Soln 5000 Unit: CUTANEOUS | Qty: 5000 | Status: AC

## 2017-09-08 MED FILL — Thrombin For Soln 20000 Unit: CUTANEOUS | Qty: 1 | Status: AC

## 2017-09-08 NOTE — Progress Notes (Signed)
Physical Therapy Treatment Patient Details Name: Gloria [name] MRN: [id number] DOB: Nov 05, 1940 Today's Date: 09/08/2017    History of Present Illness Pt is a 77 y/o female who presents s/p L4-L5 PLIF on 09/07/17. PMH significant for PNA, PVD, macular degeneration, HTN, a-fib, DM, CKD III, carotid artery occlusion.    PT Comments    Focus of session was gait training with the toe lifter for increased dorsiflexion. Pt was able to demonstrate decreased foot drop and decreased risk of falls with toe lifter donned. Pt became nauseated as soon as session began and eventually vomited. RN aware. Will continue to follow and progress as able per POC.    Follow Up Recommendations  No PT follow up;Supervision for mobility/OOB     Equipment Recommendations  Rolling walker with 5" wheels    Recommendations for Other Services       Precautions / Restrictions Precautions Precautions: Fall;Back Precaution Booklet Issued: Yes (comment) Precaution Comments: Reviewed precautions during functional mobility Required Braces or Orthoses: Spinal Brace Spinal Brace: Lumbar corset;Applied in sitting position Restrictions Weight Bearing Restrictions: No    Mobility  Bed Mobility Overal bed mobility: Needs Assistance Bed Mobility: Rolling;Sidelying to Sit;Sit to Sidelying Rolling: Supervision Sidelying to sit: Supervision     Sit to sidelying: Supervision General bed mobility comments: Verbal cues for log roll technique with no physical assistance.   Transfers Overall transfer level: Needs assistance Equipment used: Rolling walker (2 wheeled) Transfers: Sit to/from Stand Sit to Stand: Supervision         General transfer comment: Close supervision for safety.   Ambulation/Gait Ambulation/Gait assistance: Min guard Ambulation Distance (Feet): 150 Feet Assistive device: Rolling walker (2 wheeled) Gait Pattern/deviations: Step-through pattern;Decreased stride length;Trunk  flexed;Decreased dorsiflexion - right Gait velocity: Decreased Gait velocity interpretation: <1.31 ft/sec, indicative of household ambulator General Gait Details: VC's for improved posture and general safety with the RW. Utilized toe lifter over shoe to aide in R dorsiflexion and improve heel strike during gait cycle. `   Stairs             Wheelchair Mobility    Modified Rankin (Stroke Patients Only)       Balance Overall balance assessment: Needs assistance Sitting-balance support: Feet supported;No upper extremity supported Sitting balance-Leahy Scale: Fair     Standing balance support: No upper extremity supported;Bilateral upper extremity supported;During functional activity Standing balance-Leahy Scale: Fair Standing balance comment: Statically able to stand without UE support but requiring UE support for dynamic tasks.                             Cognition Arousal/Alertness: Awake/alert Behavior During Therapy: WFL for tasks assessed/performed Overall Cognitive Status: Within Functional Limits for tasks assessed                                        Exercises      General Comments        Pertinent Vitals/Pain Pain Assessment: Faces Faces Pain Scale: Hurts little more Pain Location: incision site Pain Descriptors / Indicators: Operative site guarding;Discomfort;Sore Pain Intervention(s): Monitored during session    Home Living                      Prior Function            PT Goals (current goals can now  be found in the care plan section) Acute Rehab PT Goals Patient Stated Goal: Get better PT Goal Formulation: With patient/family Time For Goal Achievement: 09/14/17 Potential to Achieve Goals: Good Progress towards PT goals: Progressing toward goals    Frequency    Min 5X/week      PT Plan Current plan remains appropriate    Co-evaluation              AM-PAC PT "6 Clicks" Daily Activity   Outcome Measure  Difficulty turning over in bed (including adjusting bedclothes, sheets and blankets)?: A Little Difficulty moving from lying on back to sitting on the side of the bed? : A Little Difficulty sitting down on and standing up from a chair with arms (e.g., wheelchair, bedside commode, etc,.)?: A Little Help needed moving to and from a bed to chair (including a wheelchair)?: A Little Help needed walking in hospital room?: A Little Help needed climbing 3-5 steps with a railing? : A Lot 6 Click Score: 17    End of Session Equipment Utilized During Treatment: Gait belt;Back brace Activity Tolerance: Patient tolerated treatment well Patient left: in chair;with call bell/phone within reach;with family/visitor present Nurse Communication: Mobility status PT Visit Diagnosis: Unsteadiness on feet (R26.81);Pain Pain - part of body: (back)     Time: 0037-0488 PT Time Calculation (min) (ACUTE ONLY): 42 min  Charges:  $Therapeutic Activity: 38-52 mins                    G Codes:       [name], PT, DPT Acute Rehabilitation Services Pager: [phone]    Thelma Comp 09/08/2017, 3:01 PM

## 2017-09-08 NOTE — Evaluation (Signed)
Occupational Therapy Evaluation and Discharge Patient Details Name: Gloria [name] MRN: [id number] DOB: 1941/04/28 Today's Date: 09/08/2017    History of Present Illness Pt is a 77 y/o female who presents s/p L4-L5 PLIF on 09/07/17. PMH significant for PNA, PVD, macular degeneration, HTN, a-fib, DM, CKD III, carotid artery occlusion.   Clinical Impression   PTA, pt was independent with cane for ADL and functional mobility. She does have foot R foot drop at baseline and noted throughout session but was able to complete functional mobility for ADL transfers with supervision and RW this session. Pt educated concerning back precautions related to ADL and she is able to verbalize 3/3 without cues. Pt and daughter educated concerning compensatory dressing, bathing, toileting, and grooming strategies as well as safe shower transfers. Pt additionally educated on: clothing between brace, never sleep in brace, correct bed positioning for sleeping, correct sequence for bed mobility, avoiding lifting more than 5 pounds and never wash directly over incision. All education is complete and patient/family indicate understanding. No further acute OT needs identified. Pt will have 24 hour assistance post-acute D/C. OT will sign off.       Follow Up Recommendations  No OT follow up;Supervision/Assistance - 24 hour    Equipment Recommendations  None recommended by OT    Recommendations for Other Services       Precautions / Restrictions Precautions Precautions: Fall;Back Precaution Booklet Issued: Yes (comment) Precaution Comments: Handout previously provided by PT. Pt educated concerning back precautions related to ADL participation and able to recall 3/3 precautions at onset of session.  Required Braces or Orthoses: Spinal Brace Spinal Brace: Lumbar corset;Applied in sitting position Restrictions Weight Bearing Restrictions: No      Mobility Bed Mobility Overal bed mobility: Needs  Assistance Bed Mobility: Rolling;Sidelying to Sit;Sit to Sidelying Rolling: Supervision Sidelying to sit: Supervision     Sit to sidelying: Supervision General bed mobility comments: Verbal cues for log roll technique with no physical assistance.   Transfers Overall transfer level: Needs assistance Equipment used: Rolling walker (2 wheeled) Transfers: Sit to/from Stand Sit to Stand: Supervision         General transfer comment: Close supervision for safety.     Balance Overall balance assessment: Needs assistance Sitting-balance support: Feet supported;No upper extremity supported Sitting balance-Leahy Scale: Fair     Standing balance support: No upper extremity supported;Bilateral upper extremity supported;During functional activity Standing balance-Leahy Scale: Fair Standing balance comment: Statically able to stand without UE support but requiring UE support for dynamic tasks.                            ADL either performed or assessed with clinical judgement   ADL Overall ADL's : Needs assistance/impaired Eating/Feeding: Modified independent;Sitting   Grooming: Supervision/safety;Standing   Upper Body Bathing: Modified independent;Sitting   Lower Body Bathing: Supervison/ safety;Sit to/from stand   Upper Body Dressing : Sitting;Supervision/safety Upper Body Dressing Details (indicate cue type and reason): Including brace Lower Body Dressing: Supervision/safety;Sit to/from stand   Toilet Transfer: Supervision/safety;Ambulation;RW   Toileting- Clothing Manipulation and Hygiene: Supervision/safety;Cueing for back precautions;Sit to/from stand   Tub/ Shower Transfer: Walk-in shower;Supervision/safety;Ambulation;Rolling walker;Shower seat   Functional mobility during ADLs: Supervision/safety;Rolling walker General ADL Comments: Pt educated concerning compensatory strategies for dressing, bathing, grooming, and toileting tasks. Additionally educated  concerning safe shower transfers. Pt and daughter demonstrate understanding of all.      Vision Baseline Vision/History: Macular Degeneration Patient Visual Report:  No change from baseline Additional Comments: Pt with history of macular degeneration but able to functionally utilize vision during evaluation.      Perception     Praxis      Pertinent Vitals/Pain Pain Assessment: 0-10 Pain Score: 6  Faces Pain Scale: Hurts little more Pain Location: incision site Pain Descriptors / Indicators: Operative site guarding;Discomfort;Sore Pain Intervention(s): Limited activity within patient's tolerance;Monitored during session;Repositioned     Hand Dominance     Extremity/Trunk Assessment Upper Extremity Assessment Upper Extremity Assessment: Overall WFL for tasks assessed   Lower Extremity Assessment Lower Extremity Assessment: Generalized weakness;RLE deficits/detail RLE Deficits / Details: Decreased strength and AROM consistent with pre-op diagnosis. Foot drop noted.   Cervical / Trunk Assessment Cervical / Trunk Assessment: Other exceptions Cervical / Trunk Exceptions: s/p spinal surgery   Communication Communication Communication: No difficulties   Cognition Arousal/Alertness: Awake/alert Behavior During Therapy: WFL for tasks assessed/performed Overall Cognitive Status: Within Functional Limits for tasks assessed                                     General Comments       Exercises     Shoulder Instructions      Home Living Family/patient expects to be discharged to:: Private residence Living Arrangements: Children Available Help at Discharge: Family;Available 24 hours/day Type of Home: House Home Access: Level entry     Home Layout: Two level;Able to live on main level with bedroom/bathroom     Bathroom Shower/Tub: Occupational psychologist: Standard     Home Equipment: Shower seat - built in;Grab bars - tub/shower;Hand held  shower head;Adaptive equipment;Bedside commode Adaptive Equipment: Reacher        Prior Functioning/Environment Level of Independence: Independent with assistive device(s)        Comments: Recent use of cane. 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         OT Problem List: Decreased activity tolerance;Impaired balance (sitting and/or standing);Decreased knowledge of use of DME or AE;Decreased knowledge of precautions;Pain      OT Treatment/Interventions:      OT Goals(Current goals can be found in the care plan section) Acute Rehab OT Goals Patient Stated Goal: Get better OT Goal Formulation: With patient/family  OT Frequency:     Barriers to D/C:            Co-evaluation              AM-PAC PT "6 Clicks" Daily Activity     Outcome Measure Help from another person eating meals?: None Help from another person taking care of personal grooming?: A Little Help from another person toileting, which includes using toliet, bedpan, or urinal?: A Little Help from another person bathing (including washing, rinsing, drying)?: A Little Help from another person to put on and taking off regular upper body clothing?: A Little Help from another person to put on and taking off regular lower body clothing?: A Little 6 Click Score: 19   End of Session Equipment Utilized During Treatment: Back brace;Rolling walker Nurse Communication: Mobility status;Patient requests pain meds  Activity Tolerance: Patient tolerated treatment well Patient left: with call bell/phone within reach;in bed;with family/visitor present  OT Visit Diagnosis: Other abnormalities of gait and mobility (R26.89);Pain Pain - part of body: (back)  Time: 4076-8088 OT Time Calculation (min): 18 min Charges:  OT General Charges $OT Visit: 1 Visit OT Evaluation $OT Eval Low Complexity: 1 Low G-Codes:     Norman Herrlich, MS OTR/L   Pager: Gloria [name] 09/08/2017, 10:24 AM

## 2017-09-08 NOTE — Plan of Care (Signed)
  Problem: Activity: Goal: Ability to avoid complications of mobility impairment will improve Outcome: Progressing Goal: Ability to tolerate increased activity will improve Outcome: Progressing Goal: Will remain free from falls Outcome: Progressing   Problem: Education: Goal: Ability to verbalize activity precautions or restrictions will improve Outcome: Progressing Goal: Knowledge of the prescribed therapeutic regimen will improve Outcome: Progressing Goal: Understanding of discharge needs will improve Outcome: Progressing   Problem: Physical Regulation: Goal: Ability to maintain clinical measurements within normal limits will improve Outcome: Progressing Goal: Postoperative complications will be avoided or minimized Outcome: Progressing Goal: Diagnostic test results will improve Outcome: Progressing   Problem: Pain Management: Goal: Pain level will decrease Outcome: Progressing   Problem: Health Behavior/Discharge Planning: Goal: Identification of resources available to assist in meeting health care needs will improve Outcome: Progressing   

## 2017-09-08 NOTE — Progress Notes (Signed)
Inpatient Diabetes Program Recommendations  AACE/ADA: New Consensus Statement on Inpatient Glycemic Control (2015)  Target Ranges:  Prepandial:   less than 140 mg/dL      Peak postprandial:   less than 180 mg/dL (1-2 hours)      Critically ill patients:  140 - 180 mg/dL   Lab Results  Component Value Date   GLUCAP 137 (H) 09/08/2017   HGBA1C 5.7 (H) 09/07/2017    Review of Glycemic ControlResults for JAINE, ESTABROOKS (MRN [id number]) as of 09/08/2017 10:21  Ref. Range 09/07/2017 06:05 09/07/2017 11:35 09/07/2017 16:41 09/07/2017 21:32 09/08/2017 06:24  Glucose-Capillary Latest Ref Range: 65 - 99 mg/dL 118 (H) 162 (H) 221 (H) 215 (H) 137 (H)   Diabetes history: Type 2 DM Outpatient Diabetes medications:  Lantus 5 units q HS, Januvia 100 mg daily Current orders for Inpatient glycemic control:  Metformin 500 mg with breakfast and Metformin 1000 mg with supper Tradjenta 5 mg daily Lantus 5 units q HS  Inpatient Diabetes Program Recommendations:   Please add Novolog sensitive correction tid with meals and HS.   Thanks  Adah Perl, RN, BC-ADM Inpatient Diabetes Coordinator Pager [phone] (8a-5p)

## 2017-09-08 NOTE — Progress Notes (Signed)
Physical Therapy Treatment Patient Details Name: Gloria [name] MRN: [id number] DOB: [birth year] Today's Date: 09/08/2017    History of Present Illness Pt is a 77 y/o female who presents s/p L4-L5 PLIF on 09/07/17. PMH significant for PNA, PVD, macular degeneration, HTN, a-fib, DM, CKD III, carotid artery occlusion.    PT Comments    Pt progressing towards physical therapy goals. Continues to demonstrate R foot drop however pt states she has more feeling in the R foot today. She was able to demonstrate some dorsiflexion - 2/5 strength. May benefit from a toe lifter until strength improves during her post-op recovery. Pt anticipates d/c home today with family. Will continue to follow.  Follow Up Recommendations  No PT follow up;Supervision for mobility/OOB     Equipment Recommendations  Rolling walker with 5" wheels    Recommendations for Other Services       Precautions / Restrictions Precautions Precautions: Fall;Back Precaution Booklet Issued: Yes (comment) Precaution Comments: Pt was cued for precautions during functional mobility Required Braces or Orthoses: Spinal Brace Spinal Brace: Lumbar corset;Applied in sitting position Restrictions Weight Bearing Restrictions: No    Mobility  Bed Mobility               General bed mobility comments: Pt was sitting up on EOB when PT arrived.   Transfers Overall transfer level: Needs assistance Equipment used: Rolling walker (2 wheeled) Transfers: Sit to/from Stand Sit to Stand: Min assist         General transfer comment: VC's for hand placement on seated surface for safety. Pt was able to power-up to full stand without assist.   Ambulation/Gait Ambulation/Gait assistance: Min guard Ambulation Distance (Feet): 200 Feet Assistive device: Rolling walker (2 wheeled) Gait Pattern/deviations: Step-through pattern;Decreased stride length;Trunk flexed;Decreased dorsiflexion - right Gait velocity: Decreased Gait  velocity interpretation: <1.31 ft/sec, indicative of household ambulator General Gait Details: VC's for improved posture and general safety with the RW. Overall pt moving slow but fairly steady with the RW.    Stairs             Wheelchair Mobility    Modified Rankin (Stroke Patients Only)       Balance Overall balance assessment: Needs assistance Sitting-balance support: Feet supported;No upper extremity supported Sitting balance-Leahy Scale: Fair     Standing balance support: No upper extremity supported;During functional activity Standing balance-Leahy Scale: Poor Standing balance comment: Reliant on UE support for safety.                             Cognition Arousal/Alertness: Awake/alert Behavior During Therapy: WFL for tasks assessed/performed Overall Cognitive Status: Within Functional Limits for tasks assessed                                        Exercises      General Comments        Pertinent Vitals/Pain Pain Assessment: Faces Faces Pain Scale: Hurts little more Pain Location: Incision site Pain Descriptors / Indicators: Operative site guarding;Discomfort Pain Intervention(s): Monitored during session    Home Living                      Prior Function            PT Goals (current goals can now be found in the care plan section) Acute Rehab  PT Goals Patient Stated Goal: Get better PT Goal Formulation: With patient/family Time For Goal Achievement: 09/14/17 Potential to Achieve Goals: Good Progress towards PT goals: Progressing toward goals    Frequency    Min 5X/week      PT Plan Current plan remains appropriate    Co-evaluation              AM-PAC PT "6 Clicks" Daily Activity  Outcome Measure  Difficulty turning over in bed (including adjusting bedclothes, sheets and blankets)?: Unable Difficulty moving from lying on back to sitting on the side of the bed? : Unable Difficulty  sitting down on and standing up from a chair with arms (e.g., wheelchair, bedside commode, etc,.)?: Unable Help needed moving to and from a bed to chair (including a wheelchair)?: A Little Help needed walking in hospital room?: A Little Help needed climbing 3-5 steps with a railing? : A Lot 6 Click Score: 11    End of Session Equipment Utilized During Treatment: Gait belt;Back brace Activity Tolerance: Patient tolerated treatment well Patient left: in chair;with call bell/phone within reach;with family/visitor present Nurse Communication: Mobility status PT Visit Diagnosis: Unsteadiness on feet (R26.81);Pain Pain - part of body: (back)     Time: 0720-0740 PT Time Calculation (min) (ACUTE ONLY): 20 min  Charges:  $Gait Training: 8-22 mins                    G Codes:       [name], PT, DPT Acute Rehabilitation Services Pager: [phone]    Thelma Comp 09/08/2017, 9:50 AM

## 2017-09-08 NOTE — Progress Notes (Signed)
Patient ID: Gloria [name], female   DOB: 30-Apr-1941, 77 y.o.   MRN: [id number] All signs are stable Postoperative hemoglobin is 8.5 Motor function is improving though right foot drop is unchanged Patient reports more sensation in the right lower extremity Pain control under moderate management with Percocet Because of increased creatinine 1.48 patient cannot have Toradol We will continue to treat conservatively Mobilizing well

## 2017-09-09 LAB — BASIC METABOLIC PANEL
ANION GAP: 8 (ref 5–15)
BUN: 31 mg/dL — ABNORMAL HIGH (ref 6–20)
CHLORIDE: 105 mmol/L (ref 101–111)
CO2: 25 mmol/L (ref 22–32)
Calcium: 7.9 mg/dL — ABNORMAL LOW (ref 8.9–10.3)
Creatinine, Ser: 2.03 mg/dL — ABNORMAL HIGH (ref 0.44–1.00)
GFR calc Af Amer: 26 mL/min — ABNORMAL LOW (ref >60)
GFR calc non Af Amer: 22 mL/min — ABNORMAL LOW (ref >60)
Glucose, Bld: 115 mg/dL — ABNORMAL HIGH (ref 65–99)
POTASSIUM: 4.2 mmol/L (ref 3.5–5.1)
Sodium: 138 mmol/L (ref 135–145)

## 2017-09-09 LAB — CBC
HEMATOCRIT: 26.3 % — AB (ref 36.0–46.0)
Hemoglobin: 8.5 g/dL — ABNORMAL LOW (ref 12.0–15.0)
MCH: 29.9 pg (ref 26.0–34.0)
MCHC: 32.3 g/dL (ref 30.0–36.0)
MCV: 92.6 fL (ref 78.0–100.0)
Platelets: 162 10*3/uL (ref 150–400)
RBC: 2.84 MIL/uL — AB (ref 3.87–5.11)
RDW: 18.5 % — ABNORMAL HIGH (ref 11.5–15.5)
WBC: 8.4 10*3/uL (ref 4.0–10.5)

## 2017-09-09 LAB — GLUCOSE, CAPILLARY
Glucose-Capillary: 127 mg/dL — ABNORMAL HIGH (ref 65–99)
Glucose-Capillary: 181 mg/dL — ABNORMAL HIGH (ref 65–99)
Glucose-Capillary: 201 mg/dL — ABNORMAL HIGH (ref 65–99)
Glucose-Capillary: 154 mg/dL — ABNORMAL HIGH (ref 65–99)

## 2017-09-09 MED ORDER — MAGNESIUM HYDROXIDE 400 MG/5ML PO SUSP
30.0000 mL | Freq: Every day | ORAL | Status: DC | PRN
  Administered 2017-09-09: 30 mL via ORAL
  Filled 2017-09-09: qty 30

## 2017-09-09 MED ORDER — FERROUS SULFATE 325 (65 FE) MG PO TABS
325.0000 mg | ORAL_TABLET | Freq: Two times a day (BID) | ORAL | Status: DC
  Administered 2017-09-09 – 2017-09-10 (×3): 325 mg via ORAL
  Filled 2017-09-09 (×3): qty 1

## 2017-09-09 MED ORDER — HYDROCODONE-ACETAMINOPHEN 5-325 MG PO TABS
1.0000 | ORAL_TABLET | ORAL | Status: DC | PRN
Start: 2017-09-09 — End: 2017-09-10
  Administered 2017-09-09 – 2017-09-10 (×5): 2 via ORAL
  Administered 2017-09-10: 1 via ORAL
  Filled 2017-09-09 (×5): qty 2
  Filled 2017-09-09: qty 1

## 2017-09-09 NOTE — Progress Notes (Signed)
Vital signs are stable Patient ID: Gloria [name], female   DOB: 1940/08/09, 77 y.o.   MRN: [id number] Vital signs are stable Motor function appears to be improving in the lower extremities particularly in the right foot Incision is clean dry Continues to mobilize steadily Plan discharge for tomorrow

## 2017-09-09 NOTE — Telephone Encounter (Signed)
Looks like pt just had surgery   WOuld resume anticoagulation when safe from surgerical standpoint.

## 2017-09-09 NOTE — Progress Notes (Signed)
Physical Therapy Treatment Patient Details Name: Gloria [name] MRN: [id number] DOB: 26-Apr-1941 Today's Date: 09/09/2017    History of Present Illness Pt is a 77 y/o female who presents s/p L4-L5 PLIF on 09/07/17. PMH significant for PNA, PVD, macular degeneration, HTN, a-fib, DM, CKD III, carotid artery occlusion.    PT Comments    Pt progressing towards physical therapy goals. Was able to perform transfers and ambulation with gross supervision for safety. Continues to do well with toe lifter donned, and noted increased fluidity of gait cycle. Pt's daughter reports pt has a walker at home, however was previously reported they do not. Family will need to confirm that they have a walker prior to d/c, as she needs the UE support for safety and will be at increased risk of falls without the RW. Will continue to follow.    Follow Up Recommendations  No PT follow up;Supervision for mobility/OOB     Equipment Recommendations  Rolling walker with 5" wheels    Recommendations for Other Services       Precautions / Restrictions Precautions Precautions: Fall;Back Precaution Booklet Issued: Yes (comment) Precaution Comments: Reviewed precautions during functional mobility Required Braces or Orthoses: Spinal Brace Spinal Brace: Lumbar corset;Applied in sitting position Restrictions Weight Bearing Restrictions: No    Mobility  Bed Mobility               General bed mobility comments: Pt sitting EOB when PT arrived.   Transfers Overall transfer level: Needs assistance Equipment used: Rolling walker (2 wheeled) Transfers: Sit to/from Stand Sit to Stand: Supervision         General transfer comment: VC's for hand placement on seated surface for safety. Pt was able to power-up to full stand without assistance.   Ambulation/Gait Ambulation/Gait assistance: Min guard;Supervision Ambulation Distance (Feet): 350 Feet Assistive device: Rolling walker (2 wheeled) Gait  Pattern/deviations: Step-through pattern;Decreased stride length;Trunk flexed;Decreased dorsiflexion - right Gait velocity: Decreased Gait velocity interpretation: <1.31 ft/sec, indicative of household ambulator General Gait Details: VC's for improved posture and general safety with the RW. Utilized toe lifter over shoe to aide in R dorsiflexion and improve heel strike during gait cycle. Grossly at a supervision level however min guard uitilized initially.    Stairs             Wheelchair Mobility    Modified Rankin (Stroke Patients Only)       Balance Overall balance assessment: Needs assistance Sitting-balance support: Feet supported;No upper extremity supported Sitting balance-Leahy Scale: Fair     Standing balance support: No upper extremity supported;Bilateral upper extremity supported;During functional activity Standing balance-Leahy Scale: Fair Standing balance comment: Statically able to stand without UE support but requiring UE support for dynamic tasks.                             Cognition Arousal/Alertness: Awake/alert Behavior During Therapy: WFL for tasks assessed/performed Overall Cognitive Status: Within Functional Limits for tasks assessed                                        Exercises      General Comments        Pertinent Vitals/Pain Pain Assessment: 0-10 Pain Score: 8  Pain Location: incision site Pain Descriptors / Indicators: Operative site guarding;Discomfort;Sore Pain Intervention(s): Monitored during session    Home Living  Prior Function            PT Goals (current goals can now be found in the care plan section) Acute Rehab PT Goals Patient Stated Goal: Get better PT Goal Formulation: With patient/family Time For Goal Achievement: 09/14/17 Potential to Achieve Goals: Good Progress towards PT goals: Progressing toward goals    Frequency    Min 5X/week       PT Plan Current plan remains appropriate    Co-evaluation              AM-PAC PT "6 Clicks" Daily Activity  Outcome Measure  Difficulty turning over in bed (including adjusting bedclothes, sheets and blankets)?: None Difficulty moving from lying on back to sitting on the side of the bed? : A Little Difficulty sitting down on and standing up from a chair with arms (e.g., wheelchair, bedside commode, etc,.)?: A Little Help needed moving to and from a bed to chair (including a wheelchair)?: A Little Help needed walking in hospital room?: A Little Help needed climbing 3-5 steps with a railing? : A Little 6 Click Score: 19    End of Session Equipment Utilized During Treatment: Gait belt;Back brace Activity Tolerance: Patient tolerated treatment well Patient left: in chair;with call bell/phone within reach;with family/visitor present Nurse Communication: Mobility status PT Visit Diagnosis: Unsteadiness on feet (R26.81);Pain Pain - part of body: (Back)     Time: 4982-6415 PT Time Calculation (min) (ACUTE ONLY): 19 min  Charges:  $Gait Training: 8-22 mins                    G Codes:       [name], PT, DPT Acute Rehabilitation Services Pager: [phone]    Thelma Comp 09/09/2017, 8:02 AM

## 2017-09-09 NOTE — Telephone Encounter (Signed)
Pre op surgical clearance was completed per telephone encounter dated 07/22/17.  This included instructions for blood thinner.     Primary Cardiologist: Dorris Carnes, MD  Chart reviewed as part of pre-operative protocol coverage. Given past medical history and time since last visit, based on ACC/AHA guidelines, Gloria [name] would be at acceptable risk for the planned procedure without further cardiovascular testing.   Per recs from pharmD and Dr. Harrington Challenger, Pt will need to hold Eliquis 3 days prior to procedure. Resume after, once safe from a surgical standpoint.    I will route this recommendation to the requesting party via Epic fax function and remove from pre-op pool.  Please call with questions.  Lyda Jester, PA-C 07/27/2017, 2:16 PM

## 2017-09-10 ENCOUNTER — Other Ambulatory Visit: Payer: Self-pay

## 2017-09-10 LAB — GLUCOSE, CAPILLARY
Glucose-Capillary: 174 mg/dL — ABNORMAL HIGH (ref 65–99)
Glucose-Capillary: 164 mg/dL — ABNORMAL HIGH (ref 65–99)

## 2017-09-10 MED ORDER — HYDROCODONE-ACETAMINOPHEN 5-325 MG PO TABS: 1.0000 | ORAL_TABLET | ORAL | 0 refills | Status: DC | PRN

## 2017-09-10 MED ORDER — METHOCARBAMOL 500 MG PO TABS: 500.0000 mg | ORAL_TABLET | Freq: Four times a day (QID) | ORAL | 3 refills | Status: DC | PRN

## 2017-09-10 NOTE — Progress Notes (Signed)
Patient is discharged from room 3C04 at this time. Alert and in stable condition. IV site d/c'd and instructions read to patient and daughter with understanding verbalized. Left unit via wheelchair with all belongings at side 

## 2017-09-10 NOTE — Progress Notes (Signed)
Physical Therapy Treatment Patient Details Name: Gloria [name] MRN: [id number] DOB: 19-Jul-1940 Today's Date: 09/10/2017    History of Present Illness Pt is a 77 y/o female who presents s/p L4-L5 PLIF on 09/07/17. PMH significant for PNA, PVD, macular degeneration, HTN, a-fib, DM, CKD III, carotid artery occlusion.    PT Comments    Pt progressing towards physical therapy goals. Was able to perform transfers and ambulation with gross supervision for safety. Continues to do well with the toe lifter, however today pt taking multiple standing rest breaks due to pain and fatigue. Pt anticipates d/c home today. Will continue to follow.   Follow Up Recommendations  No PT follow up;Supervision for mobility/OOB     Equipment Recommendations  Rolling walker with 5" wheels    Recommendations for Other Services       Precautions / Restrictions Precautions Precautions: Fall;Back Precaution Booklet Issued: Yes (comment) Precaution Comments: Reviewed precautions during functional mobility Required Braces or Orthoses: Spinal Brace Spinal Brace: Lumbar corset;Applied in sitting position Restrictions Weight Bearing Restrictions: No    Mobility  Bed Mobility               General bed mobility comments: Pt sitting EOB when PT arrived. Brace donned. Required assist to don toe lifter.  Transfers Overall transfer level: Needs assistance Equipment used: Rolling walker (2 wheeled) Transfers: Sit to/from Stand Sit to Stand: Min assist         General transfer comment: VC's for hand placement on seated surface for safety. Pt required min assist to power-up to full stand this session. Pt reports this was due to pain.   Ambulation/Gait Ambulation/Gait assistance: Supervision Ambulation Distance (Feet): 200 Feet Assistive device: Rolling walker (2 wheeled) Gait Pattern/deviations: Step-through pattern;Decreased stride length;Trunk flexed;Decreased dorsiflexion - right Gait  velocity: Decreased Gait velocity interpretation: <1.31 ft/sec, indicative of household ambulator General Gait Details: VC's for improved posture and general safety with the RW. Utilized toe lifter over shoe to aide in R dorsiflexion and improve heel strike during gait cycle. Grossly at a supervision level, however several standing rest breaks taken due to pain and fatigue.    Stairs             Wheelchair Mobility    Modified Rankin (Stroke Patients Only)       Balance Overall balance assessment: Needs assistance Sitting-balance support: Feet supported;No upper extremity supported Sitting balance-Leahy Scale: Fair     Standing balance support: No upper extremity supported;Bilateral upper extremity supported;During functional activity Standing balance-Leahy Scale: Fair Standing balance comment: Statically able to stand without UE support but requiring UE support for dynamic tasks.                             Cognition Arousal/Alertness: Awake/alert Behavior During Therapy: Flat affect Overall Cognitive Status: Within Functional Limits for tasks assessed                                        Exercises      General Comments        Pertinent Vitals/Pain Pain Assessment: 0-10 Pain Score: 7  Pain Location: incision site Pain Descriptors / Indicators: Operative site guarding;Discomfort;Sore Pain Intervention(s): Limited activity within patient's tolerance;Monitored during session    Home Living  Prior Function            PT Goals (current goals can now be found in the care plan section) Acute Rehab PT Goals Patient Stated Goal: Get better PT Goal Formulation: With patient/family Time For Goal Achievement: 09/14/17 Potential to Achieve Goals: Good Progress towards PT goals: Progressing toward goals    Frequency    Min 5X/week      PT Plan Current plan remains appropriate    Co-evaluation               AM-PAC PT "6 Clicks" Daily Activity  Outcome Measure  Difficulty turning over in bed (including adjusting bedclothes, sheets and blankets)?: None Difficulty moving from lying on back to sitting on the side of the bed? : A Little Difficulty sitting down on and standing up from a chair with arms (e.g., wheelchair, bedside commode, etc,.)?: A Little Help needed moving to and from a bed to chair (including a wheelchair)?: A Little Help needed walking in hospital room?: A Little Help needed climbing 3-5 steps with a railing? : A Little 6 Click Score: 19    End of Session Equipment Utilized During Treatment: Gait belt;Back brace Activity Tolerance: Patient tolerated treatment well Patient left: in chair;with call bell/phone within reach;with family/visitor present Nurse Communication: Mobility status PT Visit Diagnosis: Unsteadiness on feet (R26.81);Pain Pain - part of body: (back)     Time: 4268-3419 PT Time Calculation (min) (ACUTE ONLY): 18 min  Charges:  $Gait Training: 8-22 mins                    G Codes:       [name], PT, DPT Acute Rehabilitation Services Pager: [phone]    Thelma Comp 09/10/2017, 9:02 AM

## 2017-09-10 NOTE — Discharge Summary (Signed)
Physician Discharge Summary  Patient ID: SATHVIKA OJO MRN: [id number] DOB/AGE: June 12, 1940 77 y.o.  Admit date: 09/07/2017 Discharge date: 09/10/2017  Admission Diagnoses: Spondylolisthesis with foot drop L4-L5, severe radiculopathy  Discharge Diagnoses: Spondylolisthesis L4-L5, severe radiculopathy.  Acute blood loss anemia. Active Problems:   Congenital spondylolisthesis of lumbar region   Discharged Condition: good  Hospital Course: Patient was admitted to undergo surgical decompression at L4-L5 with stabilization of the joint.  She tolerated surgery well.  She had a complete foot drop and this appears to be improving in the right lower extremity.  She has acute blood loss anemia which required transfusion of 1 unit packed cells.  She is still anemic but she is not symptomatic from it.  Consults: None  Significant Diagnostic Studies: None  Treatments: surgery: Bilateral laminotomies and foraminotomies with decompression the L4 and L5 nerve roots posterior lumbar interbody arthrodesis with peek spacers posterior lateral arthrodesis with autograft allograft and infuse.  Discharge Exam: Blood pressure (!) 103/39, pulse 71, temperature 98.4 F (36.9 C), temperature source Oral, resp. rate 17, SpO2 90 %. Incision is clean dry motor function is intact.  Tibialis anterior on the right has 3 out of 5 strength.  Disposition: Discharge disposition: 01-Home or Self Care       Discharge Instructions    Call MD for:  redness, tenderness, or signs of infection (pain, swelling, redness, odor or green/yellow discharge around incision site)   Complete by:  As directed    Call MD for:  severe uncontrolled pain   Complete by:  As directed    Call MD for:  temperature >100.4   Complete by:  As directed    Diet - low sodium heart healthy   Complete by:  As directed    Discharge instructions   Complete by:  As directed    Okay to shower. Do not apply salves or appointments to  incision. No heavy lifting with the upper extremities greater than 15 pounds. May resume driving when not requiring pain medication and patient feels comfortable with doing so.   Incentive spirometry RT   Complete by:  As directed    Increase activity slowly   Complete by:  As directed      Allergies as of 09/10/2017      Reactions   Iodinated Diagnostic Agents Anaphylaxis   Iohexol Hives, Swelling    Desc: ANGIO EDEMA W/ ANAPHYLAXIS PRIOR... HIVES POST 13 HR PREP...OK/A.C.   Hibiclens [chlorhexidine Gluconate]    Levaquin [levofloxacin In D5w] Nausea Only, Other (See Comments)   dizziness   Oxycodone-acetaminophen Nausea And Vomiting   Propoxyphene N-acetaminophen Nausea And Vomiting      Medication List    TAKE these medications   amiodarone 200 MG tablet Commonly known as:  PACERONE Take 1 tablet (200 mg total) by mouth daily. What changed:  when to take this   amiodarone 200 MG tablet Commonly known as:  PACERONE Take 1 tablet (200 mg total) by mouth daily. What changed:  Another medication with the same name was changed. Make sure you understand how and when to take each.   aspirin 81 MG EC tablet Take 1 tablet (81 mg total) by mouth daily.   atorvastatin 80 MG tablet Commonly known as:  LIPITOR Take 80 mg by mouth daily.   BIOFREEZE EX Apply 1 application topically as needed (Back and Knee pain).   carboxymethylcellulose 0.5 % Soln Commonly known as:  REFRESH PLUS Place 1 drop into both eyes 3 (three)  times daily as needed (for dry/irritated eyes.).   diphenhydrAMINE 25 MG tablet Commonly known as:  BENADRYL Take 25 mg by mouth every 6 (six) hours as needed for allergies.   ELIQUIS 5 MG Tabs tablet Generic drug:  apixaban TAKE 1 TABLET BY MOUTH TWICE A DAY   fenofibrate 160 MG tablet Take 160 mg by mouth daily.   furosemide 40 MG tablet Commonly known as:  LASIX Take one tablet every other day for one week   hydrochlorothiazide 25 MG tablet Commonly  known as:  HYDRODIURIL Take 25 mg by mouth daily.   HYDROcodone-acetaminophen 5-325 MG tablet Commonly known as:  NORCO/VICODIN Take 1-2 tablets by mouth every 4 (four) hours as needed for pain. What changed:  Another medication with the same name was added. Make sure you understand how and when to take each.   HYDROcodone-acetaminophen 5-325 MG tablet Commonly known as:  NORCO/VICODIN Take 1-2 tablets by mouth every 4 (four) hours as needed for moderate pain ((score 4 to 6)). What changed:  You were already taking a medication with the same name, and this prescription was added. Make sure you understand how and when to take each.   JANUVIA 100 MG tablet Generic drug:  sitaGLIPtin Take 100 mg by mouth daily.   LANTUS SOLOSTAR 100 UNIT/ML Solostar Pen Generic drug:  Insulin Glargine Inject 5 Units into the skin at bedtime.   lidocaine 5 % Commonly known as:  LIDODERM Place 1 patch onto the skin daily as needed (Back Pain). Remove & Discard patch within 12 hours or as directed by MD   losartan 25 MG tablet Commonly known as:  COZAAR Take 25 mg by mouth daily.   metFORMIN 500 MG tablet Commonly known as:  GLUCOPHAGE Take 500-1,000 mg by mouth 2 (two) times daily. Take 1 tablet (500 mg) by mouth in the morning & Take 2 tablets (1000 mg) by mouth in the evening.   methocarbamol 500 MG tablet Commonly known as:  ROBAXIN Take 1 tablet (500 mg total) by mouth every 6 (six) hours as needed for muscle spasms.   metoprolol tartrate 25 MG tablet Commonly known as:  LOPRESSOR TAKE 1 TABLET BY MOUTH TWICE A DAY What changed:  how much to take   omeprazole 20 MG capsule Commonly known as:  PRILOSEC Take 20 mg by mouth daily before breakfast.   potassium chloride SA 20 MEQ tablet Commonly known as:  K-DUR,KLOR-CON Take one tablet every other day for one week   PRESERVISION AREDS 2 PO Take 1 tablet by mouth 2 (two) times daily.   promethazine 25 MG suppository Commonly known as:   PHENERGAN Place 25 mg rectally every 4 (four) hours as needed for nausea or vomiting.   traMADol 50 MG tablet Commonly known as:  ULTRAM Take 50-100 mg by mouth every 6 (six) hours as needed for pain.        SignedEarleen Newport 09/10/2017, 8:58 AM

## 2017-09-11 ENCOUNTER — Telehealth: Payer: Self-pay | Admitting: Internal Medicine

## 2017-09-11 LAB — TYPE AND SCREEN
ABO/RH(D): O POS
ANTIBODY SCREEN: NEGATIVE
UNIT DIVISION: 0
UNIT DIVISION: 0
UNIT DIVISION: 0
Unit division: 0

## 2017-09-11 LAB — BPAM RBC
BLOOD PRODUCT EXPIRATION DATE: 201905172359
BLOOD PRODUCT EXPIRATION DATE: 201905172359
Blood Product Expiration Date: 201905172359
Blood Product Expiration Date: 201905172359
ISSUE DATE / TIME: 201904220938
ISSUE DATE / TIME: 201904220938
UNIT TYPE AND RH: 5100
Unit Type and Rh: 5100
Unit Type and Rh: 5100
Unit Type and Rh: 5100

## 2017-09-11 NOTE — Telephone Encounter (Signed)
New message   Pt daughter [name], verbalized that pt has been DC form [facility] 09/10/2017 and there have been some medication   Changes that she wants to go over with Dr.Ross

## 2017-09-11 NOTE — Telephone Encounter (Signed)
Left message for patient to call back also.

## 2017-09-11 NOTE — Telephone Encounter (Signed)
Patient needs to get seen by someone in clinic  She was last in clinic in Feb No s/p surgery  Needs exam and labs determine what to do

## 2017-09-11 NOTE — Telephone Encounter (Signed)
Spoke with Dr. Harrington Challenger and left message for patient's daughter to call back.  Asked her to schedule appointment for this Monday (hold spot 9 am 4/29)-- Pt should be seen sooner than 09/21/17 to assess BP, swelling, medications.  Again advised that in regard to Eliquis they need to clarify with neuro when it is safe to restart.

## 2017-09-11 NOTE — Telephone Encounter (Signed)
Per patient's daughter: -losartan and amiodarone were stopped in hospital due to low BPs, diastolics were 67-61P, no other hospital readings were provided.  Last night BP 131/50, this am 139/68.  No HRs available.  Pt does not feel like she has afib since about 4 weeks ago. (gets really SOB when is in afib).  -eliquis has been held since 4/14 and it has not been restarted.  Adv daughter to contact Dr. Ellene Route for recommendations to restart.   -amiodarone and eliquis are listed on dc summary but daughter states they were instructed that pt is not to take either of these.   -feet are swollen.  Cannot elevate because pulls on spine.  Advised to use compression stockings.   Will have f/u in neuro in 2-4 weeks.  Aware I am forwarding to Dr. Harrington Challenger and will call her back with any new recommendations.

## 2017-09-11 NOTE — Telephone Encounter (Signed)
Patient's daughter has called back.  She will bring patient to appointment Monday at 9 am and is appreciative for this appointment.

## 2017-09-14 ENCOUNTER — Ambulatory Visit: Payer: Medicare Other | Admitting: Internal Medicine

## 2017-09-14 ENCOUNTER — Encounter: Payer: Self-pay | Admitting: Internal Medicine

## 2017-09-14 VITALS — BP 138/64 | HR 67 | Ht 64.5 in | Wt 180.0 lb

## 2017-09-14 DIAGNOSIS — I480 Paroxysmal atrial fibrillation: Secondary | ICD-10-CM

## 2017-09-14 DIAGNOSIS — I10 Essential (primary) hypertension: Secondary | ICD-10-CM

## 2017-09-14 DIAGNOSIS — E782 Mixed hyperlipidemia: Secondary | ICD-10-CM | POA: Diagnosis not present

## 2017-09-14 LAB — CBC
HEMATOCRIT: 28.9 % — AB (ref 34.0–46.6)
HEMOGLOBIN: 9.4 g/dL — AB (ref 11.1–15.9)
MCH: 30.3 pg (ref 26.6–33.0)
MCHC: 32.5 g/dL (ref 31.5–35.7)
MCV: 93 fL (ref 79–97)
Platelets: 401 10*3/uL — ABNORMAL HIGH (ref 150–379)
RBC: 3.1 x10E6/uL — ABNORMAL LOW (ref 3.77–5.28)
RDW: 18.7 % — ABNORMAL HIGH (ref 12.3–15.4)
WBC: 8.5 10*3/uL (ref 3.4–10.8)

## 2017-09-14 LAB — BASIC METABOLIC PANEL
BUN/Creatinine Ratio: 16 (ref 12–28)
BUN: 23 mg/dL (ref 8–27)
CALCIUM: 8.4 mg/dL — AB (ref 8.7–10.3)
CO2: 26 mmol/L (ref 20–29)
Chloride: 106 mmol/L (ref 96–106)
Creatinine, Ser: 1.43 mg/dL — ABNORMAL HIGH (ref 0.57–1.00)
GFR, EST AFRICAN AMERICAN: 41 mL/min/{1.73_m2} — AB (ref >59)
GFR, EST NON AFRICAN AMERICAN: 35 mL/min/{1.73_m2} — AB (ref >59)
Glucose: 116 mg/dL — ABNORMAL HIGH (ref 65–99)
POTASSIUM: 4.8 mmol/L (ref 3.5–5.2)
Sodium: 146 mmol/L — ABNORMAL HIGH (ref 134–144)

## 2017-09-14 MED ORDER — METOPROLOL TARTRATE 25 MG PO TABS: 25.0000 mg | ORAL_TABLET | Freq: Every day | ORAL | 6 refills | Status: DC

## 2017-09-14 MED ORDER — FUROSEMIDE 40 MG PO TABS: ORAL_TABLET | ORAL | 3 refills | Status: DC

## 2017-09-14 NOTE — Patient Instructions (Signed)
Your physician has recommended you make the following change in your medication:  1.) re start amiodarone 200 mg once a day 2.) change metoprolol tartrate to 25 mg ONCE A DAY 3.) stay off losartan and htz 4,) take lasix 40 mg -one tablet every 3 days for swelling  Your physician recommends that you return for lab work in: today (BMET, CBC)  Your physician recommends that you schedule a follow-up appointment in: 1 month with Richardson Dopp, PA-C.

## 2017-09-14 NOTE — Progress Notes (Signed)
Cardiology Office Note   Date:  09/14/2017   ID:  Gloria [name] Apr 19, 1941, MRN [id number]  PCP:  Haywood Pao, MD  Cardiologist:   Dorris Carnes, MD   F/U of PAF   History of Present Illness: Gloria [name] is a 77 y.o. female with a history of PAF, HTN, CV dz, HL, DM  Seen in ED on 05/28/17 with palpitations and SOB  Found to be in atrial fibrllation DIfficlut to control  Failed sotalol Placed on flecanide then cardioverted  Reverted to atrial fib   Sent home on amiodarone with outpt f/u for cardioversion   She converted to SR with amio alone  Seen by Maximino Greenland  I saw the pt in Feb  In the interval she has had back surgery In the hospital she was taken off of amiodarone, losartan and HCTZ due to low BP She denies palpitations Breathing is OK   She denies signif dizziness  Still using pain meds  F/U with Dr Ellene Route in the next few days     Current Meds  Medication Sig  . atorvastatin (LIPITOR) 80 MG tablet Take 80 mg by mouth daily.  . carboxymethylcellulose (REFRESH PLUS) 0.5 % SOLN Place 1 drop into both eyes 3 (three) times daily as needed (for dry/irritated eyes.).   Marland Kitchen diphenhydrAMINE (BENADRYL) 25 MG tablet Take 25 mg by mouth every 6 (six) hours as needed for allergies.  Marland Kitchen ELIQUIS 5 MG TABS tablet TAKE 1 TABLET BY MOUTH TWICE A DAY  . fenofibrate 160 MG tablet Take 160 mg by mouth daily.  Marland Kitchen HYDROcodone-acetaminophen (NORCO/VICODIN) 5-325 MG tablet Take 1-2 tablets by mouth every 4 (four) hours as needed for moderate pain ((score 4 to 6)).  Marland Kitchen JANUVIA 100 MG tablet Take 100 mg by mouth daily.  Marland Kitchen LANTUS SOLOSTAR 100 UNIT/ML Solostar Pen Inject 5 Units into the skin at bedtime.   Marland Kitchen losartan (COZAAR) 25 MG tablet Take 25 mg by mouth daily.  . Menthol, Topical Analgesic, (BIOFREEZE EX) Apply 1 application topically as needed (Back and Knee pain).  . metFORMIN (GLUCOPHAGE) 500 MG tablet Take 500-1,000 mg by mouth 2 (two) times daily. Take 1 tablet (500 mg) by  mouth in the morning & Take 2 tablets (1000 mg) by mouth in the evening.  . methocarbamol (ROBAXIN) 500 MG tablet Take 1 tablet (500 mg total) by mouth every 6 (six) hours as needed for muscle spasms.  . metoprolol tartrate (LOPRESSOR) 25 MG tablet TAKE 1 TABLET BY MOUTH TWICE A DAY  . omeprazole (PRILOSEC) 20 MG capsule Take 20 mg by mouth daily before breakfast.   . promethazine (PHENERGAN) 25 MG suppository Place 25 mg rectally every 4 (four) hours as needed for nausea or vomiting.  . traMADol (ULTRAM) 50 MG tablet Take 50-100 mg by mouth every 6 (six) hours as needed for pain.     Allergies:   Iodinated diagnostic agents; Iohexol; Hibiclens [chlorhexidine gluconate]; Levaquin [levofloxacin in d5w]; Oxycodone-acetaminophen; and Propoxyphene n-acetaminophen   Past Medical History:  Diagnosis Date  . Arthritis    fingers  . Atrial fibrillation ([place])   . Carotid artery occlusion   . CKD (chronic kidney disease), stage III ([place])   . Diabetes mellitus   . Diabetic retinopathy ([place])   . Dysrhythmia    afib  . Family history of adverse reaction to anesthesia    daughter - violent  . GERD (gastroesophageal reflux disease)   . Hyperlipidemia   . Hypertension   .  Macular degeneration    Right eye only  . Peripheral vascular disease ([facility])    hx rt carotid s/p  . Pneumonia    hx, "a long time ago"  . PONV (postoperative nausea and vomiting)   . Sleep apnea     Past Surgical History:  Procedure Laterality Date  . CARDIOVERSION N/A 06/07/2017   Procedure: CARDIOVERSION;  Surgeon: Thompson Grayer, MD;  Location: [facility];  Service: Cardiovascular;  Laterality: N/A;  . CAROTID ENDARTERECTOMY  Jan. 19, 2007   Right  cea  . CHOLECYSTECTOMY  1968  . EYE SURGERY Right    cataract.  Retinal tear- right eye  . LUMBAR LAMINECTOMY/DECOMPRESSION MICRODISCECTOMY  02/27/2012   Procedure: LUMBAR LAMINECTOMY/DECOMPRESSION MICRODISCECTOMY 2 LEVELS;  Surgeon: Kristeen Miss, MD;  Location: [facility];   Service: Neurosurgery;  Laterality: N/A;  Lumbar two-three, four-five Laminectomy/foraminotomy  . SPINAL CORD DECOMPRESSION  Oct. 2013     Social History:  The patient  reports that she quit smoking about 13 years ago. Her smoking use included cigarettes. She has a 8.00 pack-year smoking history. She has never used smokeless tobacco. She reports that she does not drink alcohol or use drugs.   Family History:  The patient's family history includes Breast cancer (age of onset: 4) in her daughter; Heart disease in her father, mother, and sister.    ROS:  Please see the history of present illness. All other systems are reviewed and  Negative to the above problem except as noted.    PHYSICAL EXAM: VS:  BP 138/64   Pulse 67   Ht 5' 4.5" (1.638 m)   Wt 180 lb (81.6 kg)   SpO2 98%   BMI 30.42 kg/m   GEN: Obese 77 yo in NAD    Examined in chair  Has support belt on  HEENT: normal  Neck:JVP normal   Cardiac: RRR; no murmurs, rubs, or gallops,1+ LE edema  Respiratory:  clear to auscultation bilaterally, normal work of breathing GI: soft, nontender, nondistended, + BS  No hepatomegaly  MS: No deformity Moving all extremities   Skin: warm and dry  Mild erythema in legs   Neuro:  Deferred   Psych: euthymic mood, full affect   EKG:  EKG is not ordered today.   Lipid Panel    Component Value Date/Time   CHOL 113 06/02/2017 0514   TRIG 180 (H) 06/02/2017 0514   HDL 14 (L) 06/02/2017 0514   CHOLHDL 8.1 06/02/2017 0514   VLDL 36 06/02/2017 0514   LDLCALC 63 06/02/2017 0514      Wt Readings from Last 3 Encounters:  09/14/17 180 lb (81.6 kg)  09/10/17 166 lb (75.3 kg)  09/03/17 166 lb 4.8 oz (75.4 kg)      ASSESSMENT AND PLAN:  1   PAF  AMio was held last week at surgery  WOuld resume at 200 daily  Do not want recurrent afib now   Dr Osborne Casco saw pt  Wonders if hypoglycemia caused afib   I do not think so    Keep on Eliquis    2  HTN BP is low at times prob due to pain meds    Not on losartan or HCTZ    Would switch metoprolol to 1x per day  Follow  BP at home   She has some edema  Will check BMET   Try lasix 40 2x per week     3  HL  Continue current meds  Will get CBC and BMET today  F/U in 1 month in clinic    Current medicines are reviewed at length with the patient today.  The patient does not have concerns regarding medicines.  Signed, Dorris Carnes, MD  09/14/2017 9:35 AM    [address], [place], [place]  [address] Phone: [phone]; Fax: [phone]

## 2017-09-21 ENCOUNTER — Ambulatory Visit: Payer: Medicare Other | Admitting: Internal Medicine

## 2017-09-21 NOTE — Telephone Encounter (Addendum)
Patient has a 10 week follow up appointment scheduled for 6/20/ 2019 at 10:20 Patient understands she needs to keep this appointment for insurance compliance. Patient was grateful for the call and thanked me.

## 2017-09-23 ENCOUNTER — Other Ambulatory Visit (HOSPITAL_COMMUNITY): Payer: Self-pay | Admitting: Neurological Surgery

## 2017-09-23 ENCOUNTER — Ambulatory Visit (HOSPITAL_COMMUNITY)
Admission: RE | Admit: 2017-09-23 | Discharge: 2017-09-23 | Disposition: A | Discharge status: Home / Self Care | Payer: Medicare Other | Source: Ambulatory Visit | Attending: Neurological Surgery | Admitting: Neurological Surgery

## 2017-09-23 DIAGNOSIS — R609 Edema, unspecified: Secondary | ICD-10-CM

## 2017-09-23 DIAGNOSIS — R600 Localized edema: Secondary | ICD-10-CM | POA: Insufficient documentation

## 2017-09-23 NOTE — Progress Notes (Addendum)
Preliminary notes--Bilateral lower extremities venous duplex exam completed.   Negative for DVT. Incidental findings: 1, Right popliteal fossa complex cystic non-vascular structure seen, measuring 2.35x0.85x3.6cm in size. 2, Bilateral arteries calcification noted. 3, Severe pitting edema noted.  Result notified Dr. Ellene Route Henery's office Ms. Deresha by phone [phone], and discussed with Dr. Clarice Pole nurse. Patient was informed to go home.   Gloria [name] (RDMS RVT) 09/23/17 2:56 PM

## 2017-09-30 ENCOUNTER — Telehealth: Payer: Self-pay | Admitting: Hematology and Oncology

## 2017-09-30 ENCOUNTER — Encounter: Payer: Self-pay | Admitting: Hematology and Oncology

## 2017-09-30 NOTE — Telephone Encounter (Signed)
Referral from Dr. Osborne Casco from [facility] w/dx of IDA.  Tc to the pt. She has been scheduled to see Dr. Lindi Adie on 6/3 at 1pm. Pt aware to arrive 30 minutes early. Letter mailed to the pt.

## 2017-10-04 ENCOUNTER — Telehealth: Payer: Self-pay | Admitting: Physician Assistant

## 2017-10-04 NOTE — Telephone Encounter (Signed)
Paged by answering service by Daughter. Patient went into afib this morning. Asymptomatic except palpitation. BP in 160s/60s with Hr to 120s. Advised to take metoprolol 25mg  BId instead of once a day. Continue amio and eliquis. May take addition metoprolol 25mg  for persistent HR > 125s. Go to ER if worsening of symptoms other call office tomorrow AM for office visit. Appreciate of call.

## 2017-10-06 ENCOUNTER — Telehealth: Payer: Self-pay | Admitting: Physician Assistant

## 2017-10-06 ENCOUNTER — Telehealth: Payer: Self-pay | Admitting: *Deleted

## 2017-10-06 ENCOUNTER — Ambulatory Visit: Payer: Medicare Other | Admitting: Physician Assistant

## 2017-10-06 ENCOUNTER — Encounter: Payer: Self-pay | Admitting: Physician Assistant

## 2017-10-06 VITALS — BP 130/68 | HR 103 | Ht 64.0 in | Wt 174.8 lb

## 2017-10-06 DIAGNOSIS — I10 Essential (primary) hypertension: Secondary | ICD-10-CM

## 2017-10-06 DIAGNOSIS — I5033 Acute on chronic diastolic (congestive) heart failure: Secondary | ICD-10-CM

## 2017-10-06 DIAGNOSIS — E782 Mixed hyperlipidemia: Secondary | ICD-10-CM

## 2017-10-06 DIAGNOSIS — Z79899 Other long term (current) drug therapy: Secondary | ICD-10-CM

## 2017-10-06 DIAGNOSIS — I480 Paroxysmal atrial fibrillation: Secondary | ICD-10-CM | POA: Diagnosis not present

## 2017-10-06 MED ORDER — METOPROLOL TARTRATE 25 MG PO TABS: 37.5000 mg | ORAL_TABLET | Freq: Two times a day (BID) | ORAL | 6 refills | Status: DC

## 2017-10-06 MED ORDER — FUROSEMIDE 40 MG PO TABS: ORAL_TABLET | ORAL | 2 refills | Status: DC

## 2017-10-06 MED ORDER — POTASSIUM CHLORIDE CRYS ER 20 MEQ PO TBCR: 20.0000 meq | EXTENDED_RELEASE_TABLET | Freq: Every day | ORAL | 2 refills | Status: DC

## 2017-10-06 NOTE — Patient Instructions (Addendum)
Medication Instructions:   START TAKING  LASIX 40 MG TWICE A DAY   START TAKING  K DUR 20 MEQ TWICE  A DAY   START TAKING METOPROLOL 37.5 MG  TWICE A DAY  ( PER TE ENCOUNTER PER VIN ADD ON)   If you need a refill on your cardiac medications before your next appointment, please call your pharmacy.  Labwork: NONE ORDERED  TODAY    Testing/Procedures:  NONE ORDERED  TODAY    Follow-Up:  AS SCHEDULED WITH VIN 10-08-17    Any Other Special Instructions Will Be Listed Below (If Applicable).

## 2017-10-06 NOTE — Telephone Encounter (Signed)
Patient's daughter called stating her mom is back in Afib in the low 100s with a pressure 108/70. She also reports that she has swelling in her lower extremities causing her skin to split. Of note, she called on Sat and was instructed to take extra lopressor for her Afib recurrence. She took an extra lopressor and Afib resolved after bout 12 hrs. The patient seems to be tolerating the rhythm well. I discussed with the daughter that her heart failure exacerbation is likely driving her Afib recurrence and that coming to the hospital would be advisable. Alternatively, she could call the office to see if she could get an early morning same-day appt. She will try to cal the office in 20 min and if she can't get an appt, she will come to the ER.  If patient's daughter calls, she needs to be seen in any  APP or MD slot, if possible.  She expressed understanding of the plan.

## 2017-10-06 NOTE — Progress Notes (Signed)
Cardiology Office Note    Date:  10/06/2017   ID:  Gloria [name] January 26, 1941, MRN [id number]  PCP:  Gloria Pao, MD  Cardiologist:  Dr. Harrington Challenger EP: Dr. Rayann Heman CPAP: Dr. Radford Pax  Chief Complaint: AFib  History of Present Illness:   Gloria [name] is a 77 y.o. female with hx of PAF, HTN, HL, DM, CKD stage III, OSA on CPAP and carotid artery disease presents for afib.   She was first diagnosed with atrial fibrillation in 2007.  Her episodes have been sporadic and typically very short lived for the last several yeas until admission in 05/2017 for afib RVR.  She failed cardioversion/sotalol and flecainide. She was started on amidoarone and discharged. Converted to sinus rhythm on amiodarone. She was maintaining sinus rhythm when in clinic 06/2017.  She was taken off of amiodarone, losartan and HCTZ due to low BP during back surgery. Last seen by Dr. Harrington Challenger 09/14/17. Restarted amiodarone. Reduce metoprolol to 25mg  once a day due to intermittent low BP.   Called over weekend due to recurrent afib. Advised to increase metoprolol to 25mg  BID again and added to my schedule to soft low BP and symptoms concerning for acute CHF.   Patient has LE edema since her back surgery. She increased lasix to 40mg  daily for the past 3 days with improved edema and 4lb weight loss. Weight of 174 today down from 180lb on 4/19. He palpitation has improved on metoprolol 25mg  BID except one episode this morning.    Past Medical History:  Diagnosis Date  . Arthritis    fingers  . Atrial fibrillation ([place])   . Carotid artery occlusion   . CKD (chronic kidney disease), stage III ([place])   . Diabetes mellitus   . Diabetic retinopathy ([place])   . Dysrhythmia    afib  . Family history of adverse reaction to anesthesia    daughter - violent  . GERD (gastroesophageal reflux disease)   . Hyperlipidemia   . Hypertension   . Macular degeneration    Right eye only  . Peripheral vascular disease ([facility])      hx rt carotid s/p  . Pneumonia    hx, "a long time ago"  . PONV (postoperative nausea and vomiting)   . Sleep apnea     Past Surgical History:  Procedure Laterality Date  . CARDIOVERSION N/A 06/07/2017   Procedure: CARDIOVERSION;  Surgeon: Thompson Grayer, MD;  Location: [facility];  Service: Cardiovascular;  Laterality: N/A;  . CAROTID ENDARTERECTOMY  Jan. 19, 2007   Right  cea  . CHOLECYSTECTOMY  1968  . EYE SURGERY Right    cataract.  Retinal tear- right eye  . LUMBAR LAMINECTOMY/DECOMPRESSION MICRODISCECTOMY  02/27/2012   Procedure: LUMBAR LAMINECTOMY/DECOMPRESSION MICRODISCECTOMY 2 LEVELS;  Surgeon: Kristeen Miss, MD;  Location: [facility];  Service: Neurosurgery;  Laterality: N/A;  Lumbar two-three, four-five Laminectomy/foraminotomy  . SPINAL CORD DECOMPRESSION  Oct. 2013    Current Medications: Prior to Admission medications   Medication Sig Start Date End Date Taking? Authorizing Provider  amiodarone (PACERONE) 200 MG tablet Take 1 tablet (200 mg total) by mouth daily. Patient not taking: Reported on 09/14/2017 08/03/17   Sherran Needs, NP  aspirin EC 81 MG EC tablet Take 1 tablet (81 mg total) by mouth daily. Patient not taking: Reported on 09/14/2017 06/10/17   Dana Allan I, MD  atorvastatin (LIPITOR) 80 MG tablet Take 80 mg by mouth daily.    [provider]  carboxymethylcellulose (REFRESH PLUS) 0.5 % SOLN Place 1 drop into both eyes 3 (three) times daily as needed (for dry/irritated eyes.).     [provider]  diphenhydrAMINE (BENADRYL) 25 MG tablet Take 25 mg by mouth every 6 (six) hours as needed for allergies.    [provider]  ELIQUIS 5 MG TABS tablet TAKE 1 TABLET BY MOUTH TWICE A DAY 07/30/17   Sherran Needs, NP  fenofibrate 160 MG tablet Take 160 mg by mouth daily.    [provider]  furosemide (LASIX) 40 MG tablet Take one tablet every 3 days for swelling 09/14/17   Fay Records, MD  HYDROcodone-acetaminophen  (NORCO/VICODIN) 5-325 MG per tablet Take 1-2 tablets by mouth every 4 (four) hours as needed for pain. Patient not taking: Reported on 09/14/2017 02/28/12   Erline Levine, MD  HYDROcodone-acetaminophen (NORCO/VICODIN) 5-325 MG tablet Take 1-2 tablets by mouth every 4 (four) hours as needed for moderate pain ((score 4 to 6)). 09/10/17   Kristeen Miss, MD  JANUVIA 100 MG tablet Take 100 mg by mouth daily. 05/20/17   [provider]  LANTUS SOLOSTAR 100 UNIT/ML Solostar Pen Inject 5 Units into the skin at bedtime.  07/21/17   [provider]  lidocaine (LIDODERM) 5 % Place 1 patch onto the skin daily as needed (Back Pain). Remove & Discard patch within 12 hours or as directed by MD     [provider]  Menthol, Topical Analgesic, (BIOFREEZE EX) Apply 1 application topically as needed (Back and Knee pain).    [provider]  metFORMIN (GLUCOPHAGE) 500 MG tablet Take 500-1,000 mg by mouth 2 (two) times daily. Take 1 tablet (500 mg) by mouth in the morning & Take 2 tablets (1000 mg) by mouth in the evening.    [provider]  methocarbamol (ROBAXIN) 500 MG tablet Take 1 tablet (500 mg total) by mouth every 6 (six) hours as needed for muscle spasms. 09/10/17   Kristeen Miss, MD  metoprolol tartrate (LOPRESSOR) 25 MG tablet Take 1 tablet (25 mg total) by mouth daily. 09/14/17   Fay Records, MD  Multiple Vitamins-Minerals (PRESERVISION AREDS 2 PO) Take 1 tablet by mouth 2 (two) times daily.    [provider]  omeprazole (PRILOSEC) 20 MG capsule Take 20 mg by mouth daily before breakfast.     [provider]  potassium chloride SA (K-DUR,KLOR-CON) 20 MEQ tablet Take one tablet every other day for one week Patient not taking: Reported on 09/14/2017 08/26/17   Fay Records, MD  promethazine (PHENERGAN) 25 MG suppository Place 25 mg rectally every 4 (four) hours as needed for nausea or vomiting.    [provider]  traMADol (ULTRAM) 50 MG tablet  Take 50-100 mg by mouth every 6 (six) hours as needed for pain. 07/22/17   [provider]    Allergies:   Iodinated diagnostic agents; Iohexol; Hibiclens [chlorhexidine gluconate]; Levaquin [levofloxacin in d5w]; Oxycodone-acetaminophen; and Propoxyphene n-acetaminophen   Social History   Socioeconomic History  . Marital status: Divorced    Spouse name: Not on file  . Number of children: Not on file  . Years of education: Not on file  . Highest education level: Not on file  Occupational History  . Occupation: Critical care nurse  Social Needs  . Financial resource strain: Not on file  . Food insecurity:    Worry: Not on file    Inability: Not on file  . Transportation needs:  Medical: Not on file    Non-medical: Not on file  Tobacco Use  . Smoking status: Former Smoker    Packs/day: 0.25    Years: 32.00    Pack years: 8.00    Types: Cigarettes    Last attempt to quit: 02/20/2004    Years since quitting: 13.6  . Smokeless tobacco: Never Used  Substance and Sexual Activity  . Alcohol use: No  . Drug use: No  . Sexual activity: Yes    Birth control/protection: Post-menopausal  Lifestyle  . Physical activity:    Days per week: Not on file    Minutes per session: Not on file  . Stress: Not on file  Relationships  . Social connections:    Talks on phone: Not on file    Gets together: Not on file    Attends religious service: Not on file    Active member of club or organization: Not on file    Attends meetings of clubs or organizations: Not on file    Relationship status: Not on file  Other Topics Concern  . Not on file  Social History Narrative  . Not on file     Family History:  The patient's family history includes Breast cancer (age of onset: 69) in her daughter; Heart disease in her father, mother, and sister.  ROS:   Please see the history of present illness.    ROS All other systems reviewed and are negative.   PHYSICAL EXAM:   VS:  BP 130/68    Pulse (!) 103   Ht 5\' 4"  (1.626 m)   Wt 174 lb 12.8 oz (79.3 kg)   SpO2 93%   BMI 30.00 kg/m    GEN: Well nourished, well developed, in no acute distress  HEENT: normal  Neck: no JVD, carotid bruits, or masses Cardiac:RRR; no murmurs, rubs, or gallops, 1+ lower extremity edema Respiratory: Faint bibasilar rate GI: soft, nontender, nondistended, + BS MS: no deformity or atrophy  Skin: warm and dry, no rash Neuro:  Alert and Oriented x 3, Strength and sensation are intact Psych: euthymic mood, full affect  Wt Readings from Last 3 Encounters:  10/06/17 174 lb 12.8 oz (79.3 kg)  09/14/17 180 lb (81.6 kg)  09/10/17 166 lb (75.3 kg)      Studies/Labs Reviewed:   EKG:  EKG is ordered today.  The ekg ordered today demonstrates atypical atrial flutter vs atrial tachycardia.   Recent Labs: 06/01/2017: B Natriuretic Peptide 1,532.3; TSH 3.128 09/14/2017: BUN 23; Creatinine, Ser 1.43; Hemoglobin 9.4; Platelets 401; Potassium 4.8; Sodium 146   Lipid Panel    Component Value Date/Time   CHOL 113 06/02/2017 0514   TRIG 180 (H) 06/02/2017 0514   HDL 14 (L) 06/02/2017 0514   CHOLHDL 8.1 06/02/2017 0514   VLDL 36 06/02/2017 0514   LDLCALC 63 06/02/2017 0514    Additional studies/ records that were reviewed today include:   Echocardiogram: 05/2017 Study Conclusions  - Left ventricle: The cavity size was normal. Wall thickness was   increased in a pattern of mild LVH. The estimated ejection   fraction was 55%. Wall motion was normal; there were no regional   wall motion abnormalities. Features are consistent with a   pseudonormal left ventricular filling pattern, with concomitant   abnormal relaxation and increased filling pressure (grade 2   diastolic dysfunction). - Aortic valve: There was no stenosis. - Mitral valve: Mildly calcified annulus. Mildly calcified leaflets   . There was trivial  regurgitation. - Left atrium: The atrium was moderately dilated. - Right ventricle:  The cavity size was normal. Systolic function   was normal. - Right atrium: The atrium was mildly dilated. - Tricuspid valve: Peak RV-RA gradient (S): 41 mm Hg. - Pulmonary arteries: PA peak pressure: 49 mm Hg (S). - Systemic veins: IVC measured 2.0 cm with < 50% respirophasic   variation, suggesting RA pressure 8 mmHg. - Pericardium, extracardiac: A trivial pericardial effusion was   identified posterior to the heart.  Impressions:  - Normal LV size with mild LV hypertrophy. EF 55%. Moderate   diastolic dysfunction. Normal RV size and systolic function. No   significant valvular abnormalities. Biatrial enlargement.   Mild-moderate pulmonary hypertension.    ASSESSMENT & PLAN:    1. Paroxysmal atrial fibrillation -Previously failed cardioversion/flecainide and sotalol.  She was maintaining sinus rhythm on prior follow-up.  Resume amiodarone during last office visit with Dr. Harrington Challenger 4/29 (taken off during back surgery). -EKG reviewed with Dr. Radford Pax today.  It shows atypical atrial flutter versus atrial tachycardia at rate of 102 bpm. -Her symptoms could be due to tachycardia versus CHF exacerbation or both.  -Increase metoprolol to 37.5 mg twice daily.  Continue Eliquis for anticoagulation.  Check EKG during follow-up, if out of rhythm consider cardioversion.  2.Acute on chronic diastolic heart failure -Weight down 4 LB in past 3 days on daily Lasix.  She still has significant edema and faint rales on exam.  She eats food high sodium.  Advised to cut back.  Increase Lasix to 40 mg twice daily and K-Dur to 20 mcg twice daily until office visit in the next 2 days.  Continue weight.   3.  Hypertension -Blood pressure stable.  Medication changes as above.   Medication Adjustments/Labs and Tests Ordered: Current medicines are reviewed at length with the patient today.  Concerns regarding medicines are outlined above.  Medication changes, Labs and Tests ordered today are listed in the  Patient Instructions below. Patient Instructions  Medication Instructions:   START TAKING  LASIX 40 MG TWICE A DAY   START TAKING  K DUR 20  MEQ TWICE  A DAY    If you need a refill on your cardiac medications before your next appointment, please call your pharmacy.  Labwork: NONE ORDERED  TODAY    Testing/Procedures:  NONE ORDERED  TODAY    Follow-Up:  AS SCHEDULED WITH VIN 10-08-17    Any Other Special Instructions Will Be Listed Below (If Applicable).                                                                                                                                                      Jarrett Soho, [place]  10/06/2017 1:18 PM    [address] [place], [place]  [address] Phone: [phone]; Fax: [phone]

## 2017-10-06 NOTE — Telephone Encounter (Signed)
SPOKE WITH DAUGHTER ABOUT METOPROLOL CHANGE OF A TABLET AND HALF TWICE A DAY. 37.5 MG  PER VIN  NEW RX SENT INTO PHARMACY

## 2017-10-08 ENCOUNTER — Ambulatory Visit: Payer: Medicare Other | Admitting: Physician Assistant

## 2017-10-08 ENCOUNTER — Encounter: Payer: Self-pay | Admitting: Physician Assistant

## 2017-10-08 VITALS — BP 130/62 | HR 70 | Ht 64.0 in | Wt 173.8 lb

## 2017-10-08 DIAGNOSIS — I509 Heart failure, unspecified: Secondary | ICD-10-CM | POA: Diagnosis not present

## 2017-10-08 DIAGNOSIS — I480 Paroxysmal atrial fibrillation: Secondary | ICD-10-CM | POA: Diagnosis not present

## 2017-10-08 DIAGNOSIS — I10 Essential (primary) hypertension: Secondary | ICD-10-CM | POA: Diagnosis not present

## 2017-10-08 DIAGNOSIS — I5032 Chronic diastolic (congestive) heart failure: Secondary | ICD-10-CM | POA: Diagnosis not present

## 2017-10-08 DIAGNOSIS — I4891 Unspecified atrial fibrillation: Secondary | ICD-10-CM | POA: Diagnosis not present

## 2017-10-08 MED ORDER — POTASSIUM CHLORIDE CRYS ER 20 MEQ PO TBCR: EXTENDED_RELEASE_TABLET | ORAL | 3 refills | Status: DC

## 2017-10-08 MED ORDER — FUROSEMIDE 40 MG PO TABS: ORAL_TABLET | ORAL | 3 refills | Status: DC

## 2017-10-08 NOTE — Progress Notes (Signed)
Cardiology Office Note    Date:  10/08/2017   ID:  Gloria [name], Gloria [name] 17-Jul-1940, MRN [id number]  PCP:  Haywood Pao, MD  Cardiologist:  Dr. Harrington Challenger EP: Dr. Rayann Heman CPAP: Dr. Radford Pax  Chief Complaint:afib follow up  History of Present Illness:   Gloria [name] is a 77 y.o. female with hx of PAF, HTN, HL, DM, CKD stage III, OSA on CPAP and carotid artery disease presents for follow up.   She was first diagnosed with atrial fibrillation in2007. Her episodes have been sporadic and typically very short lived for the last several yeas until admission in 05/2017 for afib RVR.  She failed cardioversion/sotalol and flecainide. She was started on amidoarone and discharged. Converted to sinus rhythm on amiodarone. She was maintaining sinus rhythm when in clinic 06/2017.  She was taken off of amiodarone, losartan and HCTZ due to low BP during back surgery. Last seen by Dr. Harrington Challenger 09/14/17. Restarted amiodarone. Reduce metoprolol to 25mg  once a day due to intermittent low BP.   Recently noted palpitation, increase metoprolol to 25 mg twice daily.  Seen in clinic 5/21 with EKG concerning for atrial tachycardia versus atrial flutter and volume overload.  Increase metoprolol further to 37.5 mg twice daily and short-term Lasix.  Today she presents for follow-up with daughter.  EKG shows sinus rhythm.  She is feeling much better.  Her edema has improved significantly.  Weight has been stable.  No palpitation, chest pain, shortness of breath, orthopnea, PND, syncope, lower extremity edema or melena.   Past Medical History:  Diagnosis Date  . Arthritis    fingers  . Atrial fibrillation ([place])   . Carotid artery occlusion   . CKD (chronic kidney disease), stage III ([place])   . Diabetes mellitus   . Diabetic retinopathy ([place])   . Dysrhythmia    afib  . Family history of adverse reaction to anesthesia    daughter - violent  . GERD (gastroesophageal reflux disease)   . Hyperlipidemia   .  Hypertension   . Macular degeneration    Right eye only  . Peripheral vascular disease (HCC)    hx rt carotid s/p  . Pneumonia    hx, "a long time ago"  . PONV (postoperative nausea and vomiting)   . Sleep apnea     Past Surgical History:  Procedure Laterality Date  . CARDIOVERSION N/A 06/07/2017   Procedure: CARDIOVERSION;  Surgeon: Thompson Grayer, MD;  Location: [facility];  Service: Cardiovascular;  Laterality: N/A;  . CAROTID ENDARTERECTOMY  Jan. 19, 2007   Right  cea  . CHOLECYSTECTOMY  1968  . EYE SURGERY Right    cataract.  Retinal tear- right eye  . LUMBAR LAMINECTOMY/DECOMPRESSION MICRODISCECTOMY  02/27/2012   Procedure: LUMBAR LAMINECTOMY/DECOMPRESSION MICRODISCECTOMY 2 LEVELS;  Surgeon: Kristeen Miss, MD;  Location: [facility];  Service: Neurosurgery;  Laterality: N/A;  Lumbar two-three, four-five Laminectomy/foraminotomy  . SPINAL CORD DECOMPRESSION  Oct. 2013    Current Medications: Prior to Admission medications   Medication Sig Start Date End Date Taking? Authorizing Provider  amiodarone (PACERONE) 200 MG tablet Take 1 tablet (200 mg total) by mouth daily. 08/03/17  Yes Sherran Needs, NP  atorvastatin (LIPITOR) 80 MG tablet Take 80 mg by mouth daily.   Yes [provider]  carboxymethylcellulose (REFRESH PLUS) 0.5 % SOLN Place 1 drop into both eyes 3 (three) times daily as needed (for dry/irritated eyes.).    Yes [provider]  diphenhydrAMINE (BENADRYL) 25  MG tablet Take 25 mg by mouth every 6 (six) hours as needed for allergies.   Yes [provider]  ELIQUIS 5 MG TABS tablet TAKE 1 TABLET BY MOUTH TWICE A DAY 07/30/17  Yes Sherran Needs, NP  fenofibrate 160 MG tablet Take 160 mg by mouth daily.   Yes [provider]  furosemide (LASIX) 40 MG tablet Take one tablet every 3 days for swelling 10/06/17  Yes Marrian Bells, PA  HYDROcodone-acetaminophen (NORCO/VICODIN) 5-325 MG per tablet Take 1-2 tablets by mouth every 4 (four)  hours as needed for pain. 02/28/12  Yes Erline Levine, MD  JANUVIA 100 MG tablet Take 100 mg by mouth daily. 05/20/17  Yes [provider]  LANTUS SOLOSTAR 100 UNIT/ML Solostar Pen Inject 5 Units into the skin at bedtime.  07/21/17  Yes [provider]  Menthol, Topical Analgesic, (BIOFREEZE EX) Apply 1 application topically as needed (Back and Knee pain).   Yes [provider]  metFORMIN (GLUCOPHAGE) 500 MG tablet Take 500-1,000 mg by mouth 2 (two) times daily. Take 1 tablet (500 mg) by mouth in the morning & Take 2 tablets (1000 mg) by mouth in the evening.   Yes [provider]  methocarbamol (ROBAXIN) 500 MG tablet Take 1 tablet (500 mg total) by mouth every 6 (six) hours as needed for muscle spasms. 09/10/17  Yes Kristeen Miss, MD  metoprolol tartrate (LOPRESSOR) 25 MG tablet Take 1.5 tablets (37.5 mg total) by mouth 2 (two) times daily. 10/06/17  Yes Marcia Hartwell, PA  omeprazole (PRILOSEC) 20 MG capsule Take 20 mg by mouth daily before breakfast.    Yes [provider]  potassium chloride SA (K-DUR,KLOR-CON) 20 MEQ tablet Take 1 tablet (20 mEq total) by mouth daily. 10/06/17  Yes Jomarion Mish, PA  promethazine (PHENERGAN) 25 MG suppository Place 25 mg rectally every 4 (four) hours as needed for nausea or vomiting.   Yes [provider]  traMADol (ULTRAM) 50 MG tablet Take 50-100 mg by mouth every 6 (six) hours as needed for pain. 07/22/17  Yes [provider]    Allergies:   Iodinated diagnostic agents; Iohexol; Hibiclens [chlorhexidine gluconate]; Levaquin [levofloxacin in d5w]; Oxycodone-acetaminophen; and Propoxyphene n-acetaminophen   Social History   Socioeconomic History  . Marital status: Divorced    Spouse name: Not on file  . Number of children: Not on file  . Years of education: Not on file  . Highest education level: Not on file  Occupational History  . Occupation: Critical care nurse  Social Needs  .  Financial resource strain: Not on file  . Food insecurity:    Worry: Not on file    Inability: Not on file  . Transportation needs:    Medical: Not on file    Non-medical: Not on file  Tobacco Use  . Smoking status: Former Smoker    Packs/day: 0.25    Years: 32.00    Pack years: 8.00    Types: Cigarettes    Last attempt to quit: 02/20/2004    Years since quitting: 13.6  . Smokeless tobacco: Never Used  Substance and Sexual Activity  . Alcohol use: No  . Drug use: No  . Sexual activity: Yes    Birth control/protection: Post-menopausal  Lifestyle  . Physical activity:    Days per week: Not on file    Minutes per session: Not on file  . Stress: Not on file  Relationships  . Social connections:    Talks on phone:  Not on file    Gets together: Not on file    Attends religious service: Not on file    Active member of club or organization: Not on file    Attends meetings of clubs or organizations: Not on file    Relationship status: Not on file  Other Topics Concern  . Not on file  Social History Narrative  . Not on file     Family History:  The patient's family history includes Breast cancer (age of onset: 79) in her daughter; Heart disease in her father, mother, and sister.   ROS:   Please see the history of present illness.    ROS All other systems reviewed and are negative.   PHYSICAL EXAM:   VS:  BP 130/62   Pulse 70   Ht 5\' 4"  (1.626 m)   Wt 173 lb 12.8 oz (78.8 kg)   BMI 29.83 kg/m    GEN: Well nourished, well developed, in no acute distress  HEENT: normal  Neck: no JVD, carotid bruits, or masses Cardiac: RRR; no murmurs, rubs, or gallops, Tace edema LLE edema  Respiratory:  clear to auscultation bilaterally, normal work of breathing GI: soft, nontender, nondistended, + BS MS: no deformity or atrophy  Skin: warm and dry, no rash Neuro:  Alert and Oriented x 3, Strength and sensation are intact Psych: euthymic mood, full affect  Wt Readings from Last 3  Encounters:  10/08/17 173 lb 12.8 oz (78.8 kg)  10/06/17 174 lb 12.8 oz (79.3 kg)  09/14/17 180 lb (81.6 kg)      Studies/Labs Reviewed:   EKG:  EKG is ordered today.  The ekg ordered today demonstrates sinus rhythm at rate of 70 bpm  Recent Labs: 06/01/2017: B Natriuretic Peptide 1,532.3; TSH 3.128 09/14/2017: BUN 23; Creatinine, Ser 1.43; Hemoglobin 9.4; Platelets 401; Potassium 4.8; Sodium 146   Lipid Panel    Component Value Date/Time   CHOL 113 06/02/2017 0514   TRIG 180 (H) 06/02/2017 0514   HDL 14 (L) 06/02/2017 0514   CHOLHDL 8.1 06/02/2017 0514   VLDL 36 06/02/2017 0514   LDLCALC 63 06/02/2017 0514    Additional studies/ records that were reviewed today include:   Echocardiogram: 10/06/17 Echocardiogram: 05/2017 Study Conclusions  - Left ventricle: The cavity size was normal. Wall thickness was increased in a pattern of mild LVH. The estimated ejection fraction was 55%. Wall motion was normal; there were no regional wall motion abnormalities. Features are consistent with a pseudonormal left ventricular filling pattern, with concomitant abnormal relaxation and increased filling pressure (grade 2 diastolic dysfunction). - Aortic valve: There was no stenosis. - Mitral valve: Mildly calcified annulus. Mildly calcified leaflets . There was trivial regurgitation. - Left atrium: The atrium was moderately dilated. - Right ventricle: The cavity size was normal. Systolic function was normal. - Right atrium: The atrium was mildly dilated. - Tricuspid valve: Peak RV-RA gradient (S): 41 mm Hg. - Pulmonary arteries: PA peak pressure: 49 mm Hg (S). - Systemic veins: IVC measured 2.0 cm with < 50% respirophasic variation, suggesting RA pressure 8 mmHg. - Pericardium, extracardiac: A trivial pericardial effusion was identified posterior to the heart.  Impressions:  - Normal LV size with mild LV hypertrophy. EF 55%. Moderate diastolic dysfunction.  Normal RV size and systolic function. No significant valvular abnormalities. Biatrial enlargement. Mild-moderate pulmonary hypertension.  ASSESSMENT & PLAN:     1. Paroxysmal atrial fibrillation -Previously failed cardioversion/flecainide and sotalol.  She was maintaining sinus rhythm on prior  follow-up.  Resume amiodarone during last office visit with Dr. Harrington Challenger 4/29 (taken off during back surgery). EKG 10/06/17 reviewed with Dr. Radford Pax >>atypical atrial flutter versus atrial tachycardia at rate of 102 bpm. Increase metoprolol to 37.5mg  BID.  -Converted to sinus rhythm again.  She is feeling much better.  Seems she has a tendency to go into heart failure when he goes out of rhythm.  Will continue metoprolol at current dose of 37.5 mg twice daily and amiodarone 200 mg daily.  Continue Eliquis 5 mg for anticoagulation.  2. Chronic diastolic heart failure -Edema as improved.  She is getting back on her salt intake.  Check bmet today.  Reduce Lasix and potassium for 1 week and then as needed.  3.  Hypertension -Blood pressure stable.     Medication Adjustments/Labs and Tests Ordered: Current medicines are reviewed at length with the patient today.  Concerns regarding medicines are outlined above.  Medication changes, Labs and Tests ordered today are listed in the Patient Instructions below. Patient Instructions  Medication Instructions:  Your physician has recommended you make the following change in your medication: 1.  REDUCE the Lasix to 1/2 tablet daily for 1 week only then take 1/2 tablet daily ONLY AS NEEDED FOR SWELLING 2.  REDUCE the Potassium to 1/2 tablet daily for 1 week then take 1/2 tablet daily ONLY WHEN YOU HAVE TO USE THE LASIX  Labwork: TODAY:  BMET  Testing/Procedures: None ordered  Follow-Up: Your physician recommends that you schedule a follow-up appointment in: 3-4 MONTHS WITH DR. ROSS   Any Other Special Instructions Will Be Listed Below (If  Applicable).     If you need a refill on your cardiac medications before your next appointment, please call your pharmacy.      Jarrett Soho, [place]  10/08/2017 [facility] [address], [place], [place]  [address] Phone: [phone]; Fax: [phone]

## 2017-10-08 NOTE — Patient Instructions (Signed)
Medication Instructions:  Your physician has recommended you make the following change in your medication: 1.  REDUCE the Lasix to 1/2 tablet daily for 1 week only then take 1/2 tablet daily ONLY AS NEEDED FOR SWELLING 2.  REDUCE the Potassium to 1/2 tablet daily for 1 week then take 1/2 tablet daily ONLY WHEN YOU HAVE TO USE THE LASIX  Labwork: TODAY:  BMET  Testing/Procedures: None ordered  Follow-Up: Your physician recommends that you schedule a follow-up appointment in: 3-4 MONTHS WITH DR. ROSS   Any Other Special Instructions Will Be Listed Below (If Applicable).     If you need a refill on your cardiac medications before your next appointment, please call your pharmacy.

## 2017-10-09 LAB — BASIC METABOLIC PANEL
BUN / CREAT RATIO: 15 (ref 12–28)
BUN: 25 mg/dL (ref 8–27)
CHLORIDE: 98 mmol/L (ref 96–106)
CO2: 27 mmol/L (ref 20–29)
Calcium: 7.9 mg/dL — ABNORMAL LOW (ref 8.7–10.3)
Creatinine, Ser: 1.71 mg/dL — ABNORMAL HIGH (ref 0.57–1.00)
GFR calc non Af Amer: 28 mL/min/{1.73_m2} — ABNORMAL LOW (ref >59)
GFR, EST AFRICAN AMERICAN: 33 mL/min/{1.73_m2} — AB (ref >59)
GLUCOSE: 117 mg/dL — AB (ref 65–99)
Potassium: 4.4 mmol/L (ref 3.5–5.2)
SODIUM: 145 mmol/L — AB (ref 134–144)

## 2017-10-13 ENCOUNTER — Telehealth: Payer: Self-pay | Admitting: Physician Assistant

## 2017-10-13 NOTE — Telephone Encounter (Signed)
Follow Up: ° ° ° ° ° °Returning your call from today,concerning her lab results.  °

## 2017-10-13 NOTE — Telephone Encounter (Signed)
Returned pt's call.  See result note.  

## 2017-10-13 NOTE — Telephone Encounter (Signed)
-----   Message from [place], [place] sent at 10/13/2017  8:14 AM EDT ----- Creatinine minimal worsen. If no edema, she can now take lasix PRN.

## 2017-10-14 ENCOUNTER — Ambulatory Visit: Payer: Medicare Other | Admitting: Physician Assistant

## 2017-10-19 ENCOUNTER — Inpatient Hospital Stay: Payer: Medicare Other | Attending: Hematology and Oncology | Admitting: Hematology and Oncology

## 2017-10-19 ENCOUNTER — Inpatient Hospital Stay: Payer: Medicare Other

## 2017-10-19 ENCOUNTER — Telehealth: Payer: Self-pay | Admitting: Hematology and Oncology

## 2017-10-19 DIAGNOSIS — Z794 Long term (current) use of insulin: Secondary | ICD-10-CM | POA: Diagnosis not present

## 2017-10-19 DIAGNOSIS — N183 Chronic kidney disease, stage 3 (moderate): Secondary | ICD-10-CM

## 2017-10-19 DIAGNOSIS — E11319 Type 2 diabetes mellitus with unspecified diabetic retinopathy without macular edema: Secondary | ICD-10-CM | POA: Diagnosis not present

## 2017-10-19 DIAGNOSIS — D631 Anemia in chronic kidney disease: Secondary | ICD-10-CM | POA: Diagnosis not present

## 2017-10-19 DIAGNOSIS — Z7901 Long term (current) use of anticoagulants: Secondary | ICD-10-CM | POA: Diagnosis not present

## 2017-10-19 DIAGNOSIS — E1151 Type 2 diabetes mellitus with diabetic peripheral angiopathy without gangrene: Secondary | ICD-10-CM

## 2017-10-19 DIAGNOSIS — Z87891 Personal history of nicotine dependence: Secondary | ICD-10-CM

## 2017-10-19 DIAGNOSIS — D513 Other dietary vitamin B12 deficiency anemia: Secondary | ICD-10-CM | POA: Diagnosis not present

## 2017-10-19 DIAGNOSIS — E1122 Type 2 diabetes mellitus with diabetic chronic kidney disease: Secondary | ICD-10-CM | POA: Insufficient documentation

## 2017-10-19 DIAGNOSIS — D649 Anemia, unspecified: Secondary | ICD-10-CM | POA: Diagnosis not present

## 2017-10-19 DIAGNOSIS — R760 Raised antibody titer: Secondary | ICD-10-CM | POA: Insufficient documentation

## 2017-10-19 DIAGNOSIS — E538 Deficiency of other specified B group vitamins: Secondary | ICD-10-CM | POA: Insufficient documentation

## 2017-10-19 DIAGNOSIS — D509 Iron deficiency anemia, unspecified: Secondary | ICD-10-CM | POA: Diagnosis not present

## 2017-10-19 DIAGNOSIS — N189 Chronic kidney disease, unspecified: Secondary | ICD-10-CM | POA: Insufficient documentation

## 2017-10-19 DIAGNOSIS — I129 Hypertensive chronic kidney disease with stage 1 through stage 4 chronic kidney disease, or unspecified chronic kidney disease: Secondary | ICD-10-CM | POA: Insufficient documentation

## 2017-10-19 DIAGNOSIS — Z79899 Other long term (current) drug therapy: Secondary | ICD-10-CM | POA: Diagnosis not present

## 2017-10-19 LAB — CBC WITH DIFFERENTIAL (CANCER CENTER ONLY)
BASOS ABS: 0.1 10*3/uL (ref 0.0–0.1)
BASOS PCT: 1 %
EOS ABS: 0.2 10*3/uL (ref 0.0–0.5)
EOS PCT: 2 %
HCT: 33.9 % — ABNORMAL LOW (ref 34.8–46.6)
Hemoglobin: 10.5 g/dL — ABNORMAL LOW (ref 11.6–15.9)
LYMPHS ABS: 2.6 10*3/uL (ref 0.9–3.3)
Lymphocytes Relative: 29 %
MCH: 30 pg (ref 25.1–34.0)
MCHC: 31 g/dL — ABNORMAL LOW (ref 31.5–36.0)
MCV: 96.9 fL (ref 79.5–101.0)
Monocytes Absolute: 0.8 10*3/uL (ref 0.1–0.9)
Monocytes Relative: 8 %
Neutro Abs: 5.6 10*3/uL (ref 1.5–6.5)
Neutrophils Relative %: 60 %
PLATELETS: 280 10*3/uL (ref 145–400)
RBC: 3.5 MIL/uL — ABNORMAL LOW (ref 3.70–5.45)
RDW: 15.3 % — ABNORMAL HIGH (ref 11.2–14.5)
WBC: 9.2 10*3/uL (ref 3.9–10.3)

## 2017-10-19 LAB — RETICULOCYTES
RBC.: 3.5 MIL/uL — AB (ref 3.70–5.45)
RETIC CT PCT: 1.7 % (ref 0.7–2.1)
Retic Count, Absolute: 59.5 10*3/uL (ref 33.7–90.7)

## 2017-10-19 LAB — DIRECT ANTIGLOBULIN TEST (NOT AT ARMC)
DAT, COMPLEMENT: NEGATIVE
DAT, IGG: NEGATIVE

## 2017-10-19 LAB — IRON AND TIBC
Iron: 76 ug/dL (ref 41–142)
Saturation Ratios: 24 % (ref 21–57)
TIBC: 320 ug/dL (ref 236–444)
UIBC: 244 ug/dL

## 2017-10-19 LAB — FERRITIN: Ferritin: 110 ng/mL (ref 9–269)

## 2017-10-19 LAB — FOLATE: Folate: 9 ng/mL (ref >5.9)

## 2017-10-19 LAB — TSH: TSH: 3.885 u[IU]/mL (ref 0.308–3.960)

## 2017-10-19 LAB — VITAMIN B12: Vitamin B-12: 95 pg/mL — ABNORMAL LOW (ref 180–914)

## 2017-10-19 NOTE — Telephone Encounter (Signed)
Gave patient AVS and calendar of upcoming June appointments.  °

## 2017-10-19 NOTE — Assessment & Plan Note (Signed)
09/25/2017: Hemoglobin 10.4, MCV 99, ferritin 102 patient was already on oral iron replacement therapy   Hospitalization April 2019: Back surgery requiring blood transfusion, hemoglobin dipped into 8.5.  Iron studies were not done but she was placed on oral iron therapy   Differential diagnosis: 1. Anemia due to chronic disease and inflammation 2. anemia due to renal dysfunction 3. Combined B-12 and iron deficiency anemias 4. Hemolysis 5. Hypothyroidism 6. Plasma cell disorders myeloma 7. Bone marrow dysfunction with MDS  Workup performed: 1. CBC with differential to evaluate the smear 2. CMP (done by her PCP) 3. Haptoglobin, LDH, reticulocyte count and DAT to evaluate hemolysis 4. TSH 5. SPEP 6. Iron and U-54 and folic acid levels  Return to clinic in 1 week to discuss the results

## 2017-10-19 NOTE — Progress Notes (Signed)
[facility] NOTE  Patient Care Team: Tisovec, Fransico Him, MD as PCP - General (Internal Medicine) Fay Records, MD as PCP - Cardiology (Cardiology) Marchia Bond, MD as Consulting Physician (Orthopedic Surgery)  CHIEF COMPLAINTS/PURPOSE OF CONSULTATION:  Normocytic anemia  HISTORY OF PRESENTING ILLNESS:  Gloria [name] 77 y.o. female is here because of normocytic anemia.  Patient has a baseline mild anemia with a hemoglobin of 11 g.  She has had chronic medical problems including diabetes, hypertension, heart disease and chronic kidney disease.  She was admitted to the hospital in April for a fusion surgery of the back.  At that time she was noted to have anemia and was given blood transfusion.  She was placed on oral iron therapy.  She rechecked her blood work at her primary care office and it showed an improvement in the hemoglobin but it was still low at 10.4.  Her daughter was asking whether the patient should receive intravenous iron therapy and this is the reason why she was referred to [place].  She feels extremely fatigued. Recently patient's daughter had passed away from cancer.  She has been fatigued ever since. She is retired Warden/ranger for 35 years I reviewed her records extensively and collaborated the history with the patient.  MEDICAL HISTORY:  Past Medical History:  Diagnosis Date  . Arthritis    fingers  . Atrial fibrillation ([place])   . Carotid artery occlusion   . CKD (chronic kidney disease), stage III ([place])   . Diabetes mellitus   . Diabetic retinopathy ([place])   . Dysrhythmia    afib  . Family history of adverse reaction to anesthesia    daughter - violent  . GERD (gastroesophageal reflux disease)   . Hyperlipidemia   . Hypertension   . Macular degeneration    Right eye only  . Peripheral vascular disease (H[facility])    hx rt carotid s/p  . Pneumonia    hx, "a long time ago"  . PONV (postoperative nausea and vomiting)   . Sleep apnea      SURGICAL HISTORY: Past Surgical History:  Procedure Laterality Date  . CARDIOVERSION N/A 06/07/2017   Procedure: CARDIOVERSION;  Surgeon: Thompson Grayer, MD;  Location: [facility];  Service: Cardiovascular;  Laterality: N/A;  . CAROTID ENDARTERECTOMY  Jan. 19, 2007   Right  cea  . CHOLECYSTECTOMY  1968  . EYE SURGERY Right    cataract.  Retinal tear- right eye  . LUMBAR LAMINECTOMY/DECOMPRESSION MICRODISCECTOMY  02/27/2012   Procedure: LUMBAR LAMINECTOMY/DECOMPRESSION MICRODISCECTOMY 2 LEVELS;  Surgeon: Kristeen Miss, MD;  Location: [facility];  Service: Neurosurgery;  Laterality: N/A;  Lumbar two-three, four-five Laminectomy/foraminotomy  . SPINAL CORD DECOMPRESSION  Oct. 2013    SOCIAL HISTORY: Social History   Socioeconomic History  . Marital status: Divorced    Spouse name: Not on file  . Number of children: Not on file  . Years of education: Not on file  . Highest education level: Not on file  Occupational History  . Occupation: Critical care nurse  Social Needs  . Financial resource strain: Not on file  . Food insecurity:    Worry: Not on file    Inability: Not on file  . Transportation needs:    Medical: Not on file    Non-medical: Not on file  Tobacco Use  . Smoking status: Former Smoker    Packs/day: 0.25    Years: 32.00    Pack years: 8.00  Types: Cigarettes    Last attempt to quit: 02/20/2004    Years since quitting: 13.6  . Smokeless tobacco: Never Used  Substance and Sexual Activity  . Alcohol use: No  . Drug use: No  . Sexual activity: Yes    Birth control/protection: Post-menopausal  Lifestyle  . Physical activity:    Days per week: Not on file    Minutes per session: Not on file  . Stress: Not on file  Relationships  . Social connections:    Talks on phone: Not on file    Gets together: Not on file    Attends religious service: Not on file    Active member of club or organization: Not on file    Attends meetings of clubs or organizations:  Not on file    Relationship status: Not on file  . Intimate partner violence:    Fear of current or ex partner: Not on file    Emotionally abused: Not on file    Physically abused: Not on file    Forced sexual activity: Not on file  Other Topics Concern  . Not on file  Social History Narrative  . Not on file    FAMILY HISTORY: Family History  Problem Relation Age of Onset  . Heart disease Mother        Heart Disease before age 44  . Heart disease Father   . Heart disease Sister        Heart Disease before age 49  . Breast cancer Daughter 103    ALLERGIES:  is allergic to iodinated diagnostic agents; iohexol; hibiclens [chlorhexidine gluconate]; levaquin [levofloxacin in d5w]; oxycodone-acetaminophen; and propoxyphene n-acetaminophen.  MEDICATIONS:  Current Outpatient Medications  Medication Sig Dispense Refill  . amiodarone (PACERONE) 200 MG tablet Take 1 tablet (200 mg total) by mouth daily. 30 tablet 3  . atorvastatin (LIPITOR) 80 MG tablet Take 80 mg by mouth daily.    . carboxymethylcellulose (REFRESH PLUS) 0.5 % SOLN Place 1 drop into both eyes 3 (three) times daily as needed (for dry/irritated eyes.).     Marland Kitchen diphenhydrAMINE (BENADRYL) 25 MG tablet Take 25 mg by mouth every 6 (six) hours as needed for allergies.    Marland Kitchen ELIQUIS 5 MG TABS tablet TAKE 1 TABLET BY MOUTH TWICE A DAY 60 tablet 6  . fenofibrate 160 MG tablet Take 160 mg by mouth daily.    . furosemide (LASIX) 40 MG tablet Take 1/2 tablet daily for 1 week then take 1/2 tablet only as needed for swelling 90 tablet 3  . HYDROcodone-acetaminophen (NORCO/VICODIN) 5-325 MG per tablet Take 1-2 tablets by mouth every 4 (four) hours as needed for pain. 60 tablet 0  . JANUVIA 100 MG tablet Take 100 mg by mouth daily.  1  . LANTUS SOLOSTAR 100 UNIT/ML Solostar Pen Inject 5 Units into the skin at bedtime.     . Menthol, Topical Analgesic, (BIOFREEZE EX) Apply 1 application topically as needed (Back and Knee pain).    .  metFORMIN (GLUCOPHAGE) 500 MG tablet Take 500-1,000 mg by mouth 2 (two) times daily. Take 1 tablet (500 mg) by mouth in the morning & Take 2 tablets (1000 mg) by mouth in the evening.    . methocarbamol (ROBAXIN) 500 MG tablet Take 1 tablet (500 mg total) by mouth every 6 (six) hours as needed for muscle spasms. 40 tablet 3  . metoprolol tartrate (LOPRESSOR) 25 MG tablet Take 1.5 tablets (37.5 mg total) by mouth 2 (two) times daily.  90 tablet 6  . omeprazole (PRILOSEC) 20 MG capsule Take 20 mg by mouth daily before breakfast.     . potassium chloride SA (K-DUR,KLOR-CON) 20 MEQ tablet Take 1/2 tablet daily for 1 week then take 1/2 tablet only when you take Lasix 90 tablet 3  . promethazine (PHENERGAN) 25 MG suppository Place 25 mg rectally every 4 (four) hours as needed for nausea or vomiting.    . traMADol (ULTRAM) 50 MG tablet Take 50-100 mg by mouth every 6 (six) hours as needed for pain.  0   No current facility-administered medications for this visit.     REVIEW OF SYSTEMS:   Constitutional: Severe fatigue Eyes: Denies blurriness of vision, double vision or watery eyes Ears, nose, mouth, throat, and face: Denies mucositis or sore throat Respiratory: Denies cough, dyspnea or wheezes Cardiovascular: Denies palpitation, chest discomfort or lower extremity swelling Gastrointestinal:  Denies nausea, heartburn or change in bowel habits Skin: Denies abnormal skin rashes Lymphatics: Denies new lymphadenopathy or easy bruising Neurological:Denies numbness, tingling or new weaknesses Behavioral/Psych: Mood is stable, no new changes   All other systems were reviewed with the patient and are negative.  PHYSICAL EXAMINATION: ECOG PERFORMANCE STATUS: 2 - Symptomatic, <50% confined to bed  Vitals:   10/19/17 1313  BP: (!) 123/55  Pulse: 63  Resp: 17  Temp: (!) 97.5 F (36.4 C)  SpO2: 99%   Filed Weights   10/19/17 1313  Weight: 172 lb 14.4 oz (78.4 kg)    GENERAL:alert, no distress and  comfortable SKIN: skin color, texture, turgor are normal, no rashes or significant lesions EYES: normal, conjunctiva are pink and non-injected, sclera clear OROPHARYNX:no exudate, no erythema and lips, buccal mucosa, and tongue normal  NECK: supple, thyroid normal size, non-tender, without nodularity LYMPH:  no palpable lymphadenopathy in the cervical, axillary or inguinal LUNGS: clear to auscultation and percussion with normal breathing effort HEART: regular rate & rhythm and no murmurs and no lower extremity edema ABDOMEN:abdomen soft, non-tender and normal bowel sounds Musculoskeletal:no cyanosis of digits and no clubbing  PSYCH: alert & oriented x 3 with fluent speech NEURO: no focal motor/sensory deficits  LABORATORY DATA:  I have reviewed the data as listed Lab Results  Component Value Date   WBC 9.2 10/19/2017   HGB 10.5 (L) 10/19/2017   HCT 33.9 (L) 10/19/2017   MCV 96.9 10/19/2017   PLT 280 10/19/2017   Lab Results  Component Value Date   NA 145 (H) 10/08/2017   K 4.4 10/08/2017   CL 98 10/08/2017   CO2 27 10/08/2017   ASSESSMENT AND PLAN:  Normocytic anemia 09/25/2017: Hemoglobin 10.4, MCV 99, ferritin 102 patient was already on oral iron replacement therapy   Hospitalization April 2019: Back surgery requiring blood transfusion, hemoglobin dipped into 8.5.  Iron studies were not done but she was placed on oral iron therapy   Differential diagnosis: 1. Anemia due to chronic disease and inflammation 2. anemia due to renal dysfunction 3. Combined B-12 and iron deficiency anemias 4. Hemolysis 5. Hypothyroidism 6. Plasma cell disorders myeloma 7. Bone marrow dysfunction with MDS  Workup performed: 1. CBC with differential to evaluate the smear 2. CMP (done by her PCP) 3. Haptoglobin, LDH, reticulocyte count and DAT to evaluate hemolysis 4. TSH 5. SPEP 6. Iron and E-70 and folic acid levels  Return to clinic in 1 week to discuss the results  Lab results:  B12: 95 profoundly deficient.  I recommend weekly x4 B12 injections followed by monthly Hemoglobin  10.5 Iron studies were normal I called the patient and informed her of the B12 levels being low and that she needs B12 replacement therapy.  All questions were answered. The patient knows to call the clinic with any problems, questions or concerns.    Harriette Ohara, MD 10/19/17

## 2017-10-20 ENCOUNTER — Other Ambulatory Visit: Payer: Self-pay | Admitting: Physician Assistant

## 2017-10-20 LAB — HAPTOGLOBIN: HAPTOGLOBIN: 180 mg/dL (ref 34–200)

## 2017-10-20 MED ORDER — FUROSEMIDE 40 MG PO TABS: ORAL_TABLET | ORAL | 3 refills | Status: DC

## 2017-10-20 NOTE — Telephone Encounter (Signed)
Pt's medication was sent to pt's pharmacy as requested. Confirmation received.  °

## 2017-10-21 ENCOUNTER — Telehealth: Payer: Self-pay | Admitting: Hematology and Oncology

## 2017-10-21 NOTE — Telephone Encounter (Signed)
Spoke to patient regarding upcoming June appointments per 6/3 sch message

## 2017-10-23 ENCOUNTER — Other Ambulatory Visit: Payer: Self-pay

## 2017-10-23 DIAGNOSIS — D649 Anemia, unspecified: Secondary | ICD-10-CM

## 2017-10-23 LAB — MULTIPLE MYELOMA PANEL, SERUM
Albumin SerPl Elph-Mcnc: 2.5 g/dL — ABNORMAL LOW (ref 2.9–4.4)
Albumin/Glob SerPl: 0.6 — ABNORMAL LOW (ref 0.7–1.7)
Alpha 1: 0.3 g/dL (ref 0.0–0.4)
Alpha2 Glob SerPl Elph-Mcnc: 1.2 g/dL — ABNORMAL HIGH (ref 0.4–1.0)
B-Globulin SerPl Elph-Mcnc: 1.8 g/dL — ABNORMAL HIGH (ref 0.7–1.3)
Gamma Glob SerPl Elph-Mcnc: 1.4 g/dL (ref 0.4–1.8)
Globulin, Total: 4.7 g/dL — ABNORMAL HIGH (ref 2.2–3.9)
IGA: 243 mg/dL (ref 64–422)
IGM (IMMUNOGLOBULIN M), SRM: 632 mg/dL — AB (ref 26–217)
IgG (Immunoglobin G), Serum: 1199 mg/dL (ref 700–1600)
M Protein SerPl Elph-Mcnc: 0.5 g/dL — ABNORMAL HIGH
Total Protein ELP: 7.2 g/dL (ref 6.0–8.5)

## 2017-10-26 ENCOUNTER — Inpatient Hospital Stay: Payer: Medicare Other

## 2017-10-26 ENCOUNTER — Inpatient Hospital Stay: Payer: Medicare Other | Admitting: Hematology and Oncology

## 2017-10-26 ENCOUNTER — Telehealth: Payer: Self-pay | Admitting: Hematology and Oncology

## 2017-10-26 VITALS — BP 126/39 | HR 58 | Temp 97.9°F | Resp 17 | Ht 64.0 in | Wt 165.1 lb

## 2017-10-26 DIAGNOSIS — D649 Anemia, unspecified: Secondary | ICD-10-CM | POA: Diagnosis not present

## 2017-10-26 DIAGNOSIS — R760 Raised antibody titer: Secondary | ICD-10-CM | POA: Diagnosis not present

## 2017-10-26 DIAGNOSIS — E538 Deficiency of other specified B group vitamins: Secondary | ICD-10-CM

## 2017-10-26 DIAGNOSIS — D472 Monoclonal gammopathy: Secondary | ICD-10-CM

## 2017-10-26 LAB — VITAMIN B12: Vitamin B-12: 121 pg/mL — ABNORMAL LOW (ref 180–914)

## 2017-10-26 MED ORDER — CYANOCOBALAMIN 1000 MCG/ML IJ SOLN
1000.0000 ug | Freq: Once | INTRAMUSCULAR | Status: AC
  Administered 2017-10-26: 1000 ug via INTRAMUSCULAR

## 2017-10-26 NOTE — Patient Instructions (Signed)
Cyanocobalamin, Vitamin B12 injection What is this medicine? CYANOCOBALAMIN (sye an oh koe BAL a min) is a man made form of vitamin B12. Vitamin B12 is used in the growth of healthy blood cells, nerve cells, and proteins in the body. It also helps with the metabolism of fats and carbohydrates. This medicine is used to treat people who can not absorb vitamin B12. This medicine may be used for other purposes; ask your health care provider or pharmacist if you have questions. COMMON BRAND NAME(S): B-12 Compliance Kit, B-12 Injection Kit, Cyomin, LA-12, Nutri-Twelve, Physicians EZ Use B-12, Primabalt What should I tell my health care provider before I take this medicine? They need to know if you have any of these conditions: -kidney disease -Leber's disease -megaloblastic anemia -an unusual or allergic reaction to cyanocobalamin, cobalt, other medicines, foods, dyes, or preservatives -pregnant or trying to get pregnant -breast-feeding How should I use this medicine? This medicine is injected into a muscle or deeply under the skin. It is usually given by a health care professional in a clinic or doctor's office. However, your doctor may teach you how to inject yourself. Follow all instructions. Talk to your pediatrician regarding the use of this medicine in children. Special care may be needed. Overdosage: If you think you have taken too much of this medicine contact a poison control center or emergency room at once. NOTE: This medicine is only for you. Do not share this medicine with others. What if I miss a dose? If you are given your dose at a clinic or doctor's office, call to reschedule your appointment. If you give your own injections and you miss a dose, take it as soon as you can. If it is almost time for your next dose, take only that dose. Do not take double or extra doses. What may interact with this medicine? -colchicine -heavy alcohol intake This list may not describe all possible  interactions. Give your health care provider a list of all the medicines, herbs, non-prescription drugs, or dietary supplements you use. Also tell them if you smoke, drink alcohol, or use illegal drugs. Some items may interact with your medicine. What should I watch for while using this medicine? Visit your doctor or health care professional regularly. You may need blood work done while you are taking this medicine. You may need to follow a special diet. Talk to your doctor. Limit your alcohol intake and avoid smoking to get the best benefit. What side effects may I notice from receiving this medicine? Side effects that you should report to your doctor or health care professional as soon as possible: -allergic reactions like skin rash, itching or hives, swelling of the face, lips, or tongue -blue tint to skin -chest tightness, pain -difficulty breathing, wheezing -dizziness -red, swollen painful area on the leg Side effects that usually do not require medical attention (report to your doctor or health care professional if they continue or are bothersome): -diarrhea -headache This list may not describe all possible side effects. Call your doctor for medical advice about side effects. You may report side effects to FDA at 1-800-FDA-1088. Where should I keep my medicine? Keep out of the reach of children. Store at room temperature between 15 and 30 degrees C (59 and 85 degrees F). Protect from light. Throw away any unused medicine after the expiration date. NOTE: This sheet is a summary. It may not cover all possible information. If you have questions about this medicine, talk to your doctor, pharmacist, or   health care provider.  2018 Elsevier/Gold Standard (2007-08-16 22:10:20)  

## 2017-10-26 NOTE — Assessment & Plan Note (Signed)
09/25/2017: Hemoglobin 10.4, MCV 99, ferritin 102 patient was already on oral iron replacement therapy   Hospitalization April 2019: Back surgery requiring blood transfusion, hemoglobin dipped into 8.5.  Iron studies were not done but she was placed on oral iron therapy   Differential diagnosis: 1. Anemia due to chronic disease and inflammation 2. anemia due to renal dysfunction 3. Combined B-12 and iron deficiency anemias 4. Hemolysis 5. Hypothyroidism 6. Plasma cell disorders myeloma 7. Bone marrow dysfunction with MDS  Lab review of 10/19/2017: TSH 3.8 1. TSH 3.8 2. ferritin: 110, iron saturation 24% 3. Folic acid 9 4. Coombs test negative 5. SPEP: M protein 0.5 g IgM lambda 6. Hemoglobin 10.5, MCV 97 7. Reticulocyte: 1.7%, absolute reticulocyte count 59.5 8. 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  I would like to perform bone survey and a bone marrow biopsy to evaluate this.  Marked deficiency of B12: I will start him on B12 injections and send blood work for antiparietal cell and anti-intrinsic factor antibodies.

## 2017-10-26 NOTE — Progress Notes (Signed)
Patient Care Team: Tisovec, Fransico Him, MD as PCP - General (Internal Medicine) Fay Records, MD as PCP - Cardiology (Cardiology) Marchia Bond, MD as Consulting Physician (Orthopedic Surgery)  DIAGNOSIS:  Encounter Diagnosis  Name Primary?  . Normocytic anemia    CHIEF COMPLIANT: Follow-up to discuss recent blood work  INTERVAL HISTORY: Gloria [name] is a 77 year old with above-mentioned history of normocytic anemia who is here today to discuss results of the recently performed blood work.  No new symptoms or concerns.  REVIEW OF SYSTEMS:   Constitutional: Denies fevers, chills or abnormal weight loss Eyes: Denies blurriness of vision Ears, nose, mouth, throat, and face: Denies mucositis or sore throat Respiratory: Denies cough, dyspnea or wheezes Cardiovascular: Denies palpitation, chest discomfort Gastrointestinal:  Denies nausea, heartburn or change in bowel habits Skin: Denies abnormal skin rashes Lymphatics: Denies new lymphadenopathy or easy bruising Neurological:Denies numbness, tingling or new weaknesses Behavioral/Psych: Mood is stable, no new changes  Extremities: No lower extremity edema  All other systems were reviewed with the patient and are negative.  I have reviewed the past medical history, past surgical history, social history and family history with the patient and they are unchanged from previous note.  ALLERGIES:  is allergic to iodinated diagnostic agents; iohexol; hibiclens [chlorhexidine gluconate]; levaquin [levofloxacin in d5w]; oxycodone-acetaminophen; and propoxyphene n-acetaminophen.  MEDICATIONS:  Current Outpatient Medications  Medication Sig Dispense Refill  . amiodarone (PACERONE) 200 MG tablet Take 1 tablet (200 mg total) by mouth daily. 30 tablet 3  . atorvastatin (LIPITOR) 80 MG tablet Take 80 mg by mouth daily.    . carboxymethylcellulose (REFRESH PLUS) 0.5 % SOLN Place 1 drop into both eyes 3 (three) times daily as needed (for  dry/irritated eyes.).     Marland Kitchen diphenhydrAMINE (BENADRYL) 25 MG tablet Take 25 mg by mouth every 6 (six) hours as needed for allergies.    Marland Kitchen ELIQUIS 5 MG TABS tablet TAKE 1 TABLET BY MOUTH TWICE A DAY 60 tablet 6  . fenofibrate 160 MG tablet Take 160 mg by mouth daily.    . furosemide (LASIX) 40 MG tablet take 1/2 tablet by mouth daily only as needed for swelling 45 tablet 3  . HYDROcodone-acetaminophen (NORCO/VICODIN) 5-325 MG per tablet Take 1-2 tablets by mouth every 4 (four) hours as needed for pain. 60 tablet 0  . JANUVIA 100 MG tablet Take 100 mg by mouth daily.  1  . LANTUS SOLOSTAR 100 UNIT/ML Solostar Pen Inject 5 Units into the skin at bedtime.     . Menthol, Topical Analgesic, (BIOFREEZE EX) Apply 1 application topically as needed (Back and Knee pain).    . metFORMIN (GLUCOPHAGE) 500 MG tablet Take 500-1,000 mg by mouth 2 (two) times daily. Take 1 tablet (500 mg) by mouth in the morning & Take 2 tablets (1000 mg) by mouth in the evening.    . methocarbamol (ROBAXIN) 500 MG tablet Take 1 tablet (500 mg total) by mouth every 6 (six) hours as needed for muscle spasms. 40 tablet 3  . metoprolol tartrate (LOPRESSOR) 25 MG tablet Take 1.5 tablets (37.5 mg total) by mouth 2 (two) times daily. 90 tablet 6  . omeprazole (PRILOSEC) 20 MG capsule Take 20 mg by mouth daily before breakfast.     . potassium chloride SA (K-DUR,KLOR-CON) 20 MEQ tablet Take 1/2 tablet daily for 1 week then take 1/2 tablet only when you take Lasix 90 tablet 3  . promethazine (PHENERGAN) 25 MG suppository Place 25 mg rectally every 4 (  four) hours as needed for nausea or vomiting.    . traMADol (ULTRAM) 50 MG tablet Take 50-100 mg by mouth every 6 (six) hours as needed for pain.  0   No current facility-administered medications for this visit.     PHYSICAL EXAMINATION: ECOG PERFORMANCE STATUS: 1 - Symptomatic but completely ambulatory  There were no vitals filed for this visit. There were no vitals filed for this  visit.  GENERAL:alert, no distress and comfortable SKIN: skin color, texture, turgor are normal, no rashes or significant lesions EYES: normal, Conjunctiva are pink and non-injected, sclera clear OROPHARYNX:no exudate, no erythema and lips, buccal mucosa, and tongue normal  NECK: supple, thyroid normal size, non-tender, without nodularity LYMPH:  no palpable lymphadenopathy in the cervical, axillary or inguinal LUNGS: clear to auscultation and percussion with normal breathing effort HEART: regular rate & rhythm and no murmurs and no lower extremity edema ABDOMEN:abdomen soft, non-tender and normal bowel sounds MUSCULOSKELETAL:no cyanosis of digits and no clubbing  NEURO: alert & oriented x 3 with fluent speech, no focal motor/sensory deficits EXTREMITIES: No lower extremity edema  LABORATORY DATA:  I have reviewed the data as listed CMP Latest Ref Rng & Units 10/08/2017 09/14/2017 09/09/2017  Glucose 65 - 99 mg/dL 117(H) 116(H) 115(H)  BUN 8 - 27 mg/dL 25 23 31(H)  Creatinine 0.57 - 1.00 mg/dL 1.71(H) 1.43(H) 2.03(H)  Sodium 134 - 144 mmol/L 145(H) 146(H) 138  Potassium 3.5 - 5.2 mmol/L 4.4 4.8 4.2  Chloride 96 - 106 mmol/L 98 106 105  CO2 20 - 29 mmol/L _0 Calcium 8.7 - 10.3 mg/dL 7.9(L) 8.4(L) 7.9(L)    Lab Results  Component Value Date   WBC 9.2 10/19/2017   HGB 10.5 (L) 10/19/2017   HCT 33.9 (L) 10/19/2017   MCV 96.9 10/19/2017   PLT 280 10/19/2017   NEUTROABS 5.6 10/19/2017    ASSESSMENT & PLAN:  Normocytic anemia 09/25/2017: Hemoglobin 10.4, MCV 99, ferritin 102 patient was already on oral iron replacement therapy   Hospitalization April 2019: Back surgery requiring blood transfusion, hemoglobin dipped into 8.5.  Iron studies were not done but she was placed on oral iron therapy   Differential diagnosis: 1. Anemia due to chronic disease and inflammation 2. anemia due to renal dysfunction 3. Combined B-12 and iron deficiency anemias 4. Hemolysis 5.  Hypothyroidism 6. Plasma cell disorders myeloma 7. Bone marrow dysfunction with MDS  Lab review of 10/19/2017: TSH 3.8 1. TSH 3.8 2. ferritin: 110, iron saturation 24% 3. Folic acid 9 4. Coombs test negative 5. SPEP: M protein 0.5 g IgM lambda 6. Hemoglobin 10.5, MCV 97 7. Reticulocyte: 1.7%, absolute reticulocyte count 59.5 8. 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  I would like to perform bone survey and a bone marrow biopsy to evaluate this.  Marked deficiency of B12: I will start him on B12 injections and send blood work for antiparietal cell and anti-intrinsic factor antibodies.     No orders of the defined types were placed in this encounter.  The patient has a good understanding of the overall plan. she agrees with it. she will call with any problems that may develop before the next visit here.   Harriette Ohara, MD 10/26/17

## 2017-10-26 NOTE — Telephone Encounter (Signed)
Gave patient avs and calendar of upcoming June through august appointments.

## 2017-11-02 ENCOUNTER — Inpatient Hospital Stay: Payer: Medicare Other

## 2017-11-02 DIAGNOSIS — D649 Anemia, unspecified: Secondary | ICD-10-CM

## 2017-11-02 MED ORDER — CYANOCOBALAMIN 1000 MCG/ML IJ SOLN
1000.0000 ug | Freq: Once | INTRAMUSCULAR | Status: AC
  Administered 2017-11-02: 1000 ug via INTRAMUSCULAR

## 2017-11-02 MED ORDER — CYANOCOBALAMIN 1000 MCG/ML IJ SOLN
INTRAMUSCULAR | Status: AC
  Filled 2017-11-02: qty 1

## 2017-11-05 ENCOUNTER — Ambulatory Visit: Payer: Medicare Other | Admitting: Cardiology

## 2017-11-05 ENCOUNTER — Encounter: Payer: Self-pay | Admitting: Cardiology

## 2017-11-05 ENCOUNTER — Telehealth: Payer: Self-pay | Admitting: *Deleted

## 2017-11-05 VITALS — BP 132/58 | HR 68 | Ht 64.0 in | Wt 166.8 lb

## 2017-11-05 DIAGNOSIS — Z9989 Dependence on other enabling machines and devices: Secondary | ICD-10-CM

## 2017-11-05 DIAGNOSIS — I10 Essential (primary) hypertension: Secondary | ICD-10-CM

## 2017-11-05 DIAGNOSIS — E669 Obesity, unspecified: Secondary | ICD-10-CM | POA: Diagnosis not present

## 2017-11-05 DIAGNOSIS — G4733 Obstructive sleep apnea (adult) (pediatric): Secondary | ICD-10-CM | POA: Diagnosis not present

## 2017-11-05 HISTORY — DX: Obstructive sleep apnea (adult) (pediatric): G47.33

## 2017-11-05 HISTORY — DX: Dependence on other enabling machines and devices: Z99.89

## 2017-11-05 NOTE — Telephone Encounter (Signed)
ORDERS SENT TO [facility]

## 2017-11-05 NOTE — Patient Instructions (Addendum)
Medication Instructions:  Your physician recommends that you continue on your current medications as directed. Please refer to the Current Medication list given to you today.    Labwork: None ordered  Testing/Procedures: None ordered  Follow-Up: Your physician wants you to follow-up in: 1 YEAR WITH DR. TURNER    You will receive a reminder letter in the mail two months in advance. If you don't receive a letter, please call our office to schedule the follow-up appointment.   Any Other Special Instructions Will Be Listed Below (If Applicable).     If you need a refill on your cardiac medications before your next appointment, please call your pharmacy.  . 

## 2017-11-05 NOTE — Progress Notes (Signed)
Cardiology Office Note:    Date:  11/05/2017   ID:  Gloria, [name] [birth year], MRN [id number]  PCP:  Haywood Pao, MD  Cardiologist:  Dorris Carnes, MD    Referring MD: Haywood Pao, MD   Chief Complaint  Patient presents with  . Sleep Apnea  . Hypertension    History of Present Illness:    Gloria [name] is a 77 y.o. female with a hx of atrial fibrillation, chronic kidney disease, diabetes mellitus and hypertension who was referred by Dr. Harrington Challenger and Dr. Rayann Heman for sleep study.  Epworth sleepiness score was 4.  PSG showed no evidence of obstructive sleep apnea overall but did show evidence of severe sleep apnea during REM sleep with oxygen saturations dropping to 78%.  Due to severe nocturnal hypoxemia as well as significant sleep apnea during REM sleep she was referred for CPAP titration.  He was titrated to 11 cm H2O and is now here for follow-up.  She is doing well with her CPAP device and thinks that she has gotten used to it.  She tolerates the mask and feels the pressure is adequate.  Since going on CPAP she feels rested in the am and has no significant daytime sleepiness.  She denies any significant mouth or nasal dryness or nasal congestion.  She does not think that he snores.     Past Medical History:  Diagnosis Date  . Arthritis    fingers  . Atrial fibrillation ([place])   . Carotid artery occlusion   . CKD (chronic kidney disease), stage III ([place])   . Diabetes mellitus   . Diabetic retinopathy ([place])   . Dysrhythmia    afib  . Family history of adverse reaction to anesthesia    daughter - violent  . GERD (gastroesophageal reflux disease)   . Hyperlipidemia   . Hypertension   . Macular degeneration    Right eye only  . OSA (obstructive sleep apnea) 11/05/2017   Severe sleep apnea during REM sleep with oxygen saturations dropping to 78%.  Due to severe nocturnal hypoxemia as well as significant sleep apnea during REM sleep she is now on CPAP at  11  cm H2O  . Peripheral vascular disease ([facility])    hx rt carotid s/p  . Pneumonia    hx, "a long time ago"  . PONV (postoperative nausea and vomiting)   . Sleep apnea     Past Surgical History:  Procedure Laterality Date  . CARDIOVERSION N/A 06/07/2017   Procedure: CARDIOVERSION;  Surgeon: Thompson Grayer, MD;  Location: [facility];  Service: Cardiovascular;  Laterality: N/A;  . CAROTID ENDARTERECTOMY  Jan. 19, 2007   Right  cea  . CHOLECYSTECTOMY  1968  . EYE SURGERY Right    cataract.  Retinal tear- right eye  . LUMBAR LAMINECTOMY/DECOMPRESSION MICRODISCECTOMY  02/27/2012   Procedure: LUMBAR LAMINECTOMY/DECOMPRESSION MICRODISCECTOMY 2 LEVELS;  Surgeon: Kristeen Miss, MD;  Location: [facility];  Service: Neurosurgery;  Laterality: N/A;  Lumbar two-three, four-five Laminectomy/foraminotomy  . SPINAL CORD DECOMPRESSION  Oct. 2013    Current Medications: Current Meds  Medication Sig  . amiodarone (PACERONE) 200 MG tablet Take 1 tablet (200 mg total) by mouth daily.  Marland Kitchen atorvastatin (LIPITOR) 80 MG tablet Take 80 mg by mouth daily.  . carboxymethylcellulose (REFRESH PLUS) 0.5 % SOLN Place 1 drop into both eyes 3 (three) times daily as needed (for dry/irritated eyes.).   Marland Kitchen diphenhydrAMINE (BENADRYL) 25 MG tablet Take 25 mg by mouth  every 6 (six) hours as needed for allergies.  Marland Kitchen ELIQUIS 5 MG TABS tablet TAKE 1 TABLET BY MOUTH TWICE A DAY  . fenofibrate 160 MG tablet Take 160 mg by mouth daily.  . furosemide (LASIX) 40 MG tablet take 1/2 tablet by mouth daily only as needed for swelling  . HYDROcodone-acetaminophen (NORCO/VICODIN) 5-325 MG per tablet Take 1-2 tablets by mouth every 4 (four) hours as needed for pain.  Marland Kitchen JANUVIA 100 MG tablet Take 100 mg by mouth daily.  Marland Kitchen LANTUS SOLOSTAR 100 UNIT/ML Solostar Pen Inject 5 Units into the skin at bedtime.   . Menthol, Topical Analgesic, (BIOFREEZE EX) Apply 1 application topically as needed (Back and Knee pain).  . metFORMIN (GLUCOPHAGE) 500 MG  tablet Take 500-1,000 mg by mouth 2 (two) times daily. Take 1 tablet (500 mg) by mouth in the morning & Take 2 tablets (1000 mg) by mouth in the evening.  . methocarbamol (ROBAXIN) 500 MG tablet Take 1 tablet (500 mg total) by mouth every 6 (six) hours as needed for muscle spasms.  . metoprolol tartrate (LOPRESSOR) 25 MG tablet Take 1.5 tablets (37.5 mg total) by mouth 2 (two) times daily.  Marland Kitchen omeprazole (PRILOSEC) 20 MG capsule Take 20 mg by mouth daily before breakfast.   . potassium chloride SA (K-DUR,KLOR-CON) 20 MEQ tablet Take 1/2 tablet daily for 1 week then take 1/2 tablet only when you take Lasix  . promethazine (PHENERGAN) 25 MG suppository Place 25 mg rectally every 4 (four) hours as needed for nausea or vomiting.  . traMADol (ULTRAM) 50 MG tablet Take 50-100 mg by mouth every 6 (six) hours as needed for pain.     Allergies:   Iodinated diagnostic agents; Iohexol; Hibiclens [chlorhexidine gluconate]; Levaquin [levofloxacin in d5w]; Oxycodone-acetaminophen; and Propoxyphene n-acetaminophen   Social History   Socioeconomic History  . Marital status: Divorced    Spouse name: Not on file  . Number of children: Not on file  . Years of education: Not on file  . Highest education level: Not on file  Occupational History  . Occupation: Critical care nurse  Social Needs  . Financial resource strain: Not on file  . Food insecurity:    Worry: Not on file    Inability: Not on file  . Transportation needs:    Medical: Not on file    Non-medical: Not on file  Tobacco Use  . Smoking status: Former Smoker    Packs/day: 0.25    Years: 32.00    Pack years: 8.00    Types: Cigarettes    Last attempt to quit: 02/20/2004    Years since quitting: 13.7  . Smokeless tobacco: Never Used  Substance and Sexual Activity  . Alcohol use: No  . Drug use: No  . Sexual activity: Yes    Birth control/protection: Post-menopausal  Lifestyle  . Physical activity:    Days per week: Not on file     Minutes per session: Not on file  . Stress: Not on file  Relationships  . Social connections:    Talks on phone: Not on file    Gets together: Not on file    Attends religious service: Not on file    Active member of club or organization: Not on file    Attends meetings of clubs or organizations: Not on file    Relationship status: Not on file  Other Topics Concern  . Not on file  Social History Narrative  . Not on file  Family History: The patient's family history includes Breast cancer (age of onset: 74) in her daughter; Heart disease in her father, mother, and sister.  ROS:   Please see the history of present illness.    ROS  All other systems reviewed and negative.   EKGs/Labs/Other Studies Reviewed:    The following studies were reviewed today: PAP download  EKG:  EKG is not ordered today.  Recent Labs: 06/01/2017: B Natriuretic Peptide 1,532.3 10/08/2017: BUN 25; Creatinine, Ser 1.71; Potassium 4.4; Sodium 145 10/19/2017: Hemoglobin 10.5; Platelet Count 280; TSH 3.885   Recent Lipid Panel    Component Value Date/Time   CHOL 113 06/02/2017 0514   TRIG 180 (H) 06/02/2017 0514   HDL 14 (L) 06/02/2017 0514   CHOLHDL 8.1 06/02/2017 0514   VLDL 36 06/02/2017 0514   LDLCALC 63 06/02/2017 0514    Physical Exam:    VS:  BP (!) 132/58   Pulse 68   Ht 5\' 4"  (1.626 m)   Wt 166 lb 12.8 oz (75.7 kg)   SpO2 95%   BMI 28.63 kg/m     Wt Readings from Last 3 Encounters:  11/05/17 166 lb 12.8 oz (75.7 kg)  10/26/17 165 lb 1.6 oz (74.9 kg)  10/19/17 172 lb 14.4 oz (78.4 kg)     GEN:  Well nourished, well developed in no acute distress HEENT: Normal NECK: No JVD; No carotid bruits LYMPHATICS: No lymphadenopathy CARDIAC: RRR, no murmurs, rubs, gallops RESPIRATORY:  Clear to auscultation without rales, wheezing or rhonchi  ABDOMEN: Soft, non-tender, non-distended MUSCULOSKELETAL:  No edema; No deformity  SKIN: Warm and dry NEUROLOGIC:  Alert and oriented x  3 PSYCHIATRIC:  Normal affect   ASSESSMENT:    1. OSA (obstructive sleep apnea)   2. Essential hypertension   3. Obesity (BMI 30-39.9)    PLAN:    In order of problems listed above:  1.  OSA - the patient is tolerating PAP therapy well without any problems. The PAP download was reviewed today and showed an AHI of 0.5/hr on 11 cm H2O with 93% compliance in using more than 4 hours nightly.  The patient has been using and benefiting from PAP use and will continue to benefit from therapy.   2.  HTN -BP is well controlled on exam today.  She will continue on Lopressor 37.5 mg twice daily.  3.  Obesity - I have encouraged her to get into a routine exercise program and cut back on carbs and portions.    Medication Adjustments/Labs and Tests Ordered: Current medicines are reviewed at length with the patient today.  Concerns regarding medicines are outlined above.  No orders of the defined types were placed in this encounter.  No orders of the defined types were placed in this encounter.   Signed, Fransico Him, MD  11/05/2017 10:37 AM    [place]

## 2017-11-05 NOTE — Telephone Encounter (Signed)
-----   Message from Jeanann Lewandowsky, [place] sent at 11/05/2017 10:44 AM EDT ----- Per Dr. Radford Pax, Resmed Airset P30 mask with chin strap  Check Download in 4 weeks

## 2017-11-09 ENCOUNTER — Inpatient Hospital Stay: Payer: Medicare Other

## 2017-11-09 VITALS — BP 140/49 | HR 63 | Temp 98.1°F

## 2017-11-09 DIAGNOSIS — D649 Anemia, unspecified: Secondary | ICD-10-CM | POA: Diagnosis not present

## 2017-11-09 MED ORDER — CYANOCOBALAMIN 1000 MCG/ML IJ SOLN
INTRAMUSCULAR | Status: AC
  Filled 2017-11-09: qty 1

## 2017-11-09 MED ORDER — CYANOCOBALAMIN 1000 MCG/ML IJ SOLN
1000.0000 ug | Freq: Once | INTRAMUSCULAR | Status: AC
  Administered 2017-11-09: 1000 ug via INTRAMUSCULAR

## 2017-11-13 ENCOUNTER — Telehealth: Payer: Self-pay | Admitting: Internal Medicine

## 2017-11-13 NOTE — Telephone Encounter (Signed)
New Message   Pt c/o medication issue:  1. Name of Medication: losartan  2. How are you currently taking this medication (dosage and times per day)?   3. Are you having a reaction (difficulty breathing--STAT)?   4. What is your medication issue? Patients daughter is calling to see when her mother should resume taking the losartan

## 2017-11-13 NOTE — Telephone Encounter (Signed)
Patient's daughter is calling and wanting to know if patient needs to go back on Losartan. Patient's Losartan was discontinued back in January when patient was in the hospital. It looks like patient's BP was low and some of her BP medications were discontinued during her stay in the hospital. Will forward to Dr. Harrington Challenger for advisement.

## 2017-11-13 NOTE — Telephone Encounter (Signed)
Would not put back on losartan BP is good .

## 2017-11-13 NOTE — Telephone Encounter (Signed)
Need to know what BP is running now.

## 2017-11-13 NOTE — Telephone Encounter (Signed)
Per Patient's daughter, SBP 130's -140's, DBP 50 to 60's, HR mid 60's.

## 2017-11-16 ENCOUNTER — Inpatient Hospital Stay: Payer: Medicare Other

## 2017-11-16 ENCOUNTER — Inpatient Hospital Stay: Payer: Medicare Other | Attending: Hematology and Oncology

## 2017-11-16 ENCOUNTER — Ambulatory Visit (HOSPITAL_COMMUNITY)
Admission: RE | Admit: 2017-11-16 | Discharge: 2017-11-16 | Disposition: A | Discharge status: Home / Self Care | Payer: Medicare Other | Source: Ambulatory Visit | Attending: Hematology and Oncology | Admitting: Hematology and Oncology

## 2017-11-16 ENCOUNTER — Ambulatory Visit: Payer: Medicare Other

## 2017-11-16 ENCOUNTER — Telehealth: Payer: Self-pay | Admitting: Hematology and Oncology

## 2017-11-16 ENCOUNTER — Telehealth: Payer: Self-pay

## 2017-11-16 ENCOUNTER — Inpatient Hospital Stay (HOSPITAL_BASED_OUTPATIENT_CLINIC_OR_DEPARTMENT_OTHER): Payer: Medicare Other | Admitting: Hematology and Oncology

## 2017-11-16 VITALS — BP 113/76 | HR 56 | Temp 97.9°F | Resp 17 | Ht 64.0 in | Wt 165.3 lb

## 2017-11-16 DIAGNOSIS — E538 Deficiency of other specified B group vitamins: Secondary | ICD-10-CM | POA: Diagnosis not present

## 2017-11-16 DIAGNOSIS — D649 Anemia, unspecified: Secondary | ICD-10-CM | POA: Diagnosis present

## 2017-11-16 DIAGNOSIS — D472 Monoclonal gammopathy: Secondary | ICD-10-CM | POA: Insufficient documentation

## 2017-11-16 LAB — CBC WITH DIFFERENTIAL (CANCER CENTER ONLY)
BASOS PCT: 1 %
Basophils Absolute: 0.1 10*3/uL (ref 0.0–0.1)
EOS ABS: 0.1 10*3/uL (ref 0.0–0.5)
Eosinophils Relative: 2 %
HEMATOCRIT: 32.8 % — AB (ref 34.8–46.6)
HEMOGLOBIN: 10.7 g/dL — AB (ref 11.6–15.9)
LYMPHS ABS: 1.9 10*3/uL (ref 0.9–3.3)
Lymphocytes Relative: 30 %
MCH: 30 pg (ref 25.1–34.0)
MCHC: 32.5 g/dL (ref 31.5–36.0)
MCV: 92.1 fL (ref 79.5–101.0)
MONO ABS: 0.5 10*3/uL (ref 0.1–0.9)
MONOS PCT: 8 %
NEUTROS ABS: 3.9 10*3/uL (ref 1.5–6.5)
Neutrophils Relative %: 59 %
Platelet Count: 237 10*3/uL (ref 145–400)
RBC: 3.56 MIL/uL — ABNORMAL LOW (ref 3.70–5.45)
RDW: 14.3 % (ref 11.2–14.5)
WBC Count: 6.6 10*3/uL (ref 3.9–10.3)

## 2017-11-16 MED ORDER — CYANOCOBALAMIN 1000 MCG/ML IJ SOLN
1000.0000 ug | Freq: Once | INTRAMUSCULAR | Status: AC
  Administered 2017-11-16: 1000 ug via INTRAMUSCULAR

## 2017-11-16 NOTE — Patient Instructions (Signed)
Cyanocobalamin, Vitamin B12 injection What is this medicine? CYANOCOBALAMIN (sye an oh koe BAL a min) is a man made form of vitamin B12. Vitamin B12 is used in the growth of healthy blood cells, nerve cells, and proteins in the body. It also helps with the metabolism of fats and carbohydrates. This medicine is used to treat people who can not absorb vitamin B12. This medicine may be used for other purposes; ask your health care provider or pharmacist if you have questions. COMMON BRAND NAME(S): B-12 Compliance Kit, B-12 Injection Kit, Cyomin, LA-12, Nutri-Twelve, Physicians EZ Use B-12, Primabalt What should I tell my health care provider before I take this medicine? They need to know if you have any of these conditions: -kidney disease -Leber's disease -megaloblastic anemia -an unusual or allergic reaction to cyanocobalamin, cobalt, other medicines, foods, dyes, or preservatives -pregnant or trying to get pregnant -breast-feeding How should I use this medicine? This medicine is injected into a muscle or deeply under the skin. It is usually given by a health care professional in a clinic or doctor's office. However, your doctor may teach you how to inject yourself. Follow all instructions. Talk to your pediatrician regarding the use of this medicine in children. Special care may be needed. Overdosage: If you think you have taken too much of this medicine contact a poison control center or emergency room at once. NOTE: This medicine is only for you. Do not share this medicine with others. What if I miss a dose? If you are given your dose at a clinic or doctor's office, call to reschedule your appointment. If you give your own injections and you miss a dose, take it as soon as you can. If it is almost time for your next dose, take only that dose. Do not take double or extra doses. What may interact with this medicine? -colchicine -heavy alcohol intake This list may not describe all possible  interactions. Give your health care provider a list of all the medicines, herbs, non-prescription drugs, or dietary supplements you use. Also tell them if you smoke, drink alcohol, or use illegal drugs. Some items may interact with your medicine. What should I watch for while using this medicine? Visit your doctor or health care professional regularly. You may need blood work done while you are taking this medicine. You may need to follow a special diet. Talk to your doctor. Limit your alcohol intake and avoid smoking to get the best benefit. What side effects may I notice from receiving this medicine? Side effects that you should report to your doctor or health care professional as soon as possible: -allergic reactions like skin rash, itching or hives, swelling of the face, lips, or tongue -blue tint to skin -chest tightness, pain -difficulty breathing, wheezing -dizziness -red, swollen painful area on the leg Side effects that usually do not require medical attention (report to your doctor or health care professional if they continue or are bothersome): -diarrhea -headache This list may not describe all possible side effects. Call your doctor for medical advice about side effects. You may report side effects to FDA at 1-800-FDA-1088. Where should I keep my medicine? Keep out of the reach of children. Store at room temperature between 15 and 30 degrees C (59 and 85 degrees F). Protect from light. Throw away any unused medicine after the expiration date. NOTE: This sheet is a summary. It may not cover all possible information. If you have questions about this medicine, talk to your doctor, pharmacist, or   health care provider.  2018 Elsevier/Gold Standard (2007-08-16 22:10:20)  

## 2017-11-16 NOTE — Telephone Encounter (Signed)
I spoke with pt's daughter and gave her information from Dr.Ross

## 2017-11-16 NOTE — Telephone Encounter (Signed)
Printed avs and calender of upcoming appointment. Per 7/1 los

## 2017-11-16 NOTE — Progress Notes (Signed)
Patient Care Team: Tisovec, Fransico Him, MD as PCP - General (Internal Medicine) Fay Records, MD as PCP - Cardiology (Cardiology) Marchia Bond, MD as Consulting Physician (Orthopedic Surgery)  DIAGNOSIS:  Encounter Diagnosis  Name Primary?  Marland Kitchen MGUS (monoclonal gammopathy of unknown significance) Yes    CHIEF COMPLIANT: Follow-up of MGUS and profound B12 deficiency  INTERVAL HISTORY: Gloria [name] is a 77 year old with above-mentioned history of MGUS and profound B12 deficiency who is here for a one-month follow-up.  She has noticed improvement in her energy levels with B12 injections.  She will start on monthly B12 shots from today.  REVIEW OF SYSTEMS:   Constitutional: Denies fevers, chills or abnormal weight loss Eyes: Denies blurriness of vision Ears, nose, mouth, throat, and face: Denies mucositis or sore throat Respiratory: Denies cough, dyspnea or wheezes Cardiovascular: Denies palpitation, chest discomfort Gastrointestinal:  Denies nausea, heartburn or change in bowel habits Skin: Bruising of the skin Lymphatics: Denies new lymphadenopathy or easy bruising Neurological:Denies numbness, tingling or new weaknesses Behavioral/Psych: Mood is stable, no new changes  Extremities: No lower extremity edema  All other systems were reviewed with the patient and are negative.  I have reviewed the past medical history, past surgical history, social history and family history with the patient and they are unchanged from previous note.  ALLERGIES:  is allergic to iodinated diagnostic agents; iohexol; hibiclens [chlorhexidine gluconate]; levaquin [levofloxacin in d5w]; oxycodone-acetaminophen; and propoxyphene n-acetaminophen.  MEDICATIONS:  Current Outpatient Medications  Medication Sig Dispense Refill  . amiodarone (PACERONE) 200 MG tablet Take 1 tablet (200 mg total) by mouth daily. 30 tablet 3  . atorvastatin (LIPITOR) 80 MG tablet Take 80 mg by mouth daily.    .  carboxymethylcellulose (REFRESH PLUS) 0.5 % SOLN Place 1 drop into both eyes 3 (three) times daily as needed (for dry/irritated eyes.).     Marland Kitchen diphenhydrAMINE (BENADRYL) 25 MG tablet Take 25 mg by mouth every 6 (six) hours as needed for allergies.    Marland Kitchen ELIQUIS 5 MG TABS tablet TAKE 1 TABLET BY MOUTH TWICE A DAY 60 tablet 6  . fenofibrate 160 MG tablet Take 160 mg by mouth daily.    . furosemide (LASIX) 40 MG tablet take 1/2 tablet by mouth daily only as needed for swelling 45 tablet 3  . HYDROcodone-acetaminophen (NORCO/VICODIN) 5-325 MG per tablet Take 1-2 tablets by mouth every 4 (four) hours as needed for pain. 60 tablet 0  . JANUVIA 100 MG tablet Take 100 mg by mouth daily.  1  . LANTUS SOLOSTAR 100 UNIT/ML Solostar Pen Inject 5 Units into the skin at bedtime.     . Menthol, Topical Analgesic, (BIOFREEZE EX) Apply 1 application topically as needed (Back and Knee pain).    . metFORMIN (GLUCOPHAGE) 500 MG tablet Take 500-1,000 mg by mouth 2 (two) times daily. Take 1 tablet (500 mg) by mouth in the morning & Take 2 tablets (1000 mg) by mouth in the evening.    . methocarbamol (ROBAXIN) 500 MG tablet Take 1 tablet (500 mg total) by mouth every 6 (six) hours as needed for muscle spasms. 40 tablet 3  . metoprolol tartrate (LOPRESSOR) 25 MG tablet Take 1.5 tablets (37.5 mg total) by mouth 2 (two) times daily. 90 tablet 6  . omeprazole (PRILOSEC) 20 MG capsule Take 20 mg by mouth daily before breakfast.     . potassium chloride SA (K-DUR,KLOR-CON) 20 MEQ tablet Take 1/2 tablet daily for 1 week then take 1/2 tablet only when  you take Lasix 90 tablet 3  . promethazine (PHENERGAN) 25 MG suppository Place 25 mg rectally every 4 (four) hours as needed for nausea or vomiting.    . traMADol (ULTRAM) 50 MG tablet Take 50-100 mg by mouth every 6 (six) hours as needed for pain.  0   No current facility-administered medications for this visit.     PHYSICAL EXAMINATION: ECOG PERFORMANCE STATUS: 1 - Symptomatic  but completely ambulatory  Vitals:   11/16/17 1005  BP: 113/76  Pulse: (!) 56  Resp: 17  Temp: 97.9 F (36.6 C)  SpO2: 100%   Filed Weights   11/16/17 1005  Weight: 165 lb 4.8 oz (75 kg)    GENERAL:alert, no distress and comfortable SKIN: skin color, texture, turgor are normal, no rashes or significant lesions EYES: normal, Conjunctiva are pink and non-injected, sclera clear OROPHARYNX:no exudate, no erythema and lips, buccal mucosa, and tongue normal  NECK: supple, thyroid normal size, non-tender, without nodularity LYMPH:  no palpable lymphadenopathy in the cervical, axillary or inguinal LUNGS: clear to auscultation and percussion with normal breathing effort HEART: regular rate & rhythm and no murmurs and no lower extremity edema ABDOMEN:abdomen soft, non-tender and normal bowel sounds MUSCULOSKELETAL:no cyanosis of digits and no clubbing  NEURO: alert & oriented x 3 with fluent speech, no focal motor/sensory deficits EXTREMITIES: No lower extremity edema   LABORATORY DATA:  I have reviewed the data as listed CMP Latest Ref Rng & Units 10/08/2017 09/14/2017 09/09/2017  Glucose 65 - 99 mg/dL 117(H) 116(H) 115(H)  BUN 8 - 27 mg/dL 25 23 31(H)  Creatinine 0.57 - 1.00 mg/dL 1.71(H) 1.43(H) 2.03(H)  Sodium 134 - 144 mmol/L 145(H) 146(H) 138  Potassium 3.5 - 5.2 mmol/L 4.4 4.8 4.2  Chloride 96 - 106 mmol/L 98 106 105  CO2 20 - 29 mmol/L _0 Calcium 8.7 - 10.3 mg/dL 7.9(L) 8.4(L) 7.9(L)    Lab Results  Component Value Date   WBC 6.6 11/16/2017   HGB 10.7 (L) 11/16/2017   HCT 32.8 (L) 11/16/2017   MCV 92.1 11/16/2017   PLT 237 11/16/2017   NEUTROABS 3.9 11/16/2017    ASSESSMENT & PLAN:  MGUS (monoclonal gammopathy of unknown significance) 09/25/2017: Hemoglobin 10.4, MCV 99, ferritin 102 patient was already on oral iron replacement therapy   Hospitalization April 2019: Back surgery requiring blood transfusion, hemoglobin dipped into 8.5.  Iron studies were not  done but she was placed on oral iron therapy   Differential diagnosis: 1. Anemia due to chronic disease and inflammation 2. anemia due to renal dysfunction 3. Combined B-12 and iron deficiency anemias 4. Hemolysis 5. Hypothyroidism 6. Plasma cell disorders myeloma 7. Bone marrow dysfunction with MDS  Lab review of 11/16/2017: Hemoglobin 10.7, MCV 92 previously. B12 95 June 2019: M protein: 0.5 g IgM  Marked deficiency of B12: She will now start monthly B12 injections.  She had a remarkable improvement in her energy levels.  Bone survey will be obtained today. I will call her with results of bone survey.    Orders Placed This Encounter  Procedures  . CBC with Differential (Cancer Center Only)    Standing Status:   Future    Standing Expiration Date:   11/17/2018  . CMP ([facility] only)    Standing Status:   Future    Standing Expiration Date:   11/17/2018  . Multiple Myeloma Panel (SPEP&IFE w/QIG)    Standing Status:   Future    Standing Expiration Date:  12/21/2018  . Kappa/lambda light chains    Standing Status:   Future    Standing Expiration Date:   12/21/2018   The patient has a good understanding of the overall plan. she agrees with it. she will call with any problems that may develop before the next visit here.   Harriette Ohara, MD 11/16/17

## 2017-11-16 NOTE — Assessment & Plan Note (Signed)
09/25/2017: Hemoglobin 10.4, MCV 99, ferritin 102 patient was already on oral iron replacement therapy   Hospitalization April 2019: Back surgery requiring blood transfusion, hemoglobin dipped into 8.5.  Iron studies were not done but she was placed on oral iron therapy   Differential diagnosis: 1. Anemia due to chronic disease and inflammation 2. anemia due to renal dysfunction 3. Combined B-12 and iron deficiency anemias 4. Hemolysis 5. Hypothyroidism 6. Plasma cell disorders myeloma 7. Bone marrow dysfunction with MDS  Lab review of 11/16/2017: Hemoglobin 10.7, MCV 92 previously. B12 95 June 2019: M protein: 0.5 g IgM  Marked deficiency of B12: She will now start monthly B12 injections.  She had a remarkable improvement in her energy levels.  Bone survey will be obtained today. I will call her with results of bone survey.

## 2017-11-16 NOTE — Telephone Encounter (Signed)
I informed the patient the bone survey showed a few lucencies in the skull but otherwise negative. I do not believe these lucencies warrant any additional investigations at this time. We will monitor her blood work and follow her once a year.

## 2017-11-17 LAB — KAPPA/LAMBDA LIGHT CHAINS
KAPPA FREE LGHT CHN: 60.3 mg/L — AB (ref 3.3–19.4)
Kappa, lambda light chain ratio: 1.83 — ABNORMAL HIGH (ref 0.26–1.65)
Lambda free light chains: 32.9 mg/L — ABNORMAL HIGH (ref 5.7–26.3)

## 2017-11-17 LAB — BETA 2 MICROGLOBULIN, SERUM: Beta-2 Microglobulin: 5.6 mg/L — ABNORMAL HIGH (ref 0.6–2.4)

## 2017-11-18 LAB — MULTIPLE MYELOMA PANEL, SERUM
ALBUMIN/GLOB SERPL: 1.1 (ref 0.7–1.7)
ALPHA2 GLOB SERPL ELPH-MCNC: 0.8 g/dL (ref 0.4–1.0)
Albumin SerPl Elph-Mcnc: 3.5 g/dL (ref 2.9–4.4)
Alpha 1: 0.2 g/dL (ref 0.0–0.4)
B-GLOBULIN SERPL ELPH-MCNC: 1.3 g/dL (ref 0.7–1.3)
GAMMA GLOB SERPL ELPH-MCNC: 1.1 g/dL (ref 0.4–1.8)
GLOBULIN, TOTAL: 3.4 g/dL (ref 2.2–3.9)
IGG (IMMUNOGLOBIN G), SERUM: 1258 mg/dL (ref 700–1600)
IgA: 208 mg/dL (ref 64–422)
IgM (Immunoglobulin M), Srm: 574 mg/dL — ABNORMAL HIGH (ref 26–217)
M Protein SerPl Elph-Mcnc: 0.6 g/dL — ABNORMAL HIGH
Total Protein ELP: 6.9 g/dL (ref 6.0–8.5)

## 2017-11-23 ENCOUNTER — Telehealth: Payer: Self-pay | Admitting: *Deleted

## 2017-11-23 DIAGNOSIS — G4733 Obstructive sleep apnea (adult) (pediatric): Secondary | ICD-10-CM

## 2017-11-23 NOTE — Telephone Encounter (Signed)
-----   Message from Sueanne Margarita, MD sent at 11/23/2017 12:50 PM EDT ----- Regarding: RE: Rx I am fine with Auto CPAP from 4 to 11cm H2O but need a download in 2 weeks  Traci Turner ----- Message ----- From: Freada Bergeron, CMA Sent: 11/23/2017  10:04 AM To: Sueanne Margarita, MD Subject: Rx                                             [facility]) would like a new Rx to decrease patients pressure to auto set 4 to 11.  Thanks

## 2017-11-23 NOTE — Telephone Encounter (Signed)
Order faxed to [facility]. 

## 2017-12-01 ENCOUNTER — Telehealth: Payer: Self-pay | Admitting: Internal Medicine

## 2017-12-01 NOTE — Telephone Encounter (Signed)
New Message:      *STAT* If patient is at the pharmacy, call can be transferred to refill team.   1. Which medications need to be refilled? (please list name of each medication and dose if known) furosemide (LASIX) 40 MG tablet  2. Which pharmacy/location (including street and city if local pharmacy) is medication to be sent to?[id number] - [place], [place] - [address] AT [place]  3. Do they need a 30 day or 90 day supply? [place]

## 2017-12-01 NOTE — Telephone Encounter (Signed)
Called pt to inform her that she has refills at her pharmacy and if she has any other problems, questions or concerns, please give our office a call back. Pt verbalized understanding,.

## 2017-12-03 ENCOUNTER — Telehealth: Payer: Self-pay

## 2017-12-03 ENCOUNTER — Telehealth: Payer: Self-pay | Admitting: Internal Medicine

## 2017-12-03 NOTE — Telephone Encounter (Signed)
NEW MESSAGE   1. What dental office are you calling from? Dr Malcolm Metro   2. What is your office phone number? [phone]  3. What is your fax number? [phone]  4. What type of procedure is the patient having performed? 1 extraction  5. What date is procedure scheduled or is the patient there now? 12/24/17  6. What is your question (ex. Antibiotics prior to procedure, holding medication-we need to know how long dentist wants pt to hold med)? Hold any meds??

## 2017-12-03 NOTE — Telephone Encounter (Signed)
   Primary Cardiologist:Paula Harrington Challenger, MD  Chart reviewed as part of pre-operative protocol coverage. Because of Gloria [name]'s past medical history and time since last visit, he/she will require a follow-up visit in order to better assess preoperative cardiovascular risk.  Pt has appt with Dr. Harrington Challenger 02/01/18 and she will give clearance  Pre-op covering staff: - Please schedule appointment and call patient to inform them. - Please contact requesting surgeon's office via preferred method (i.e, phone, fax) to inform them of need for appointment prior to surgery.  Cecilie Kicks, NP  12/03/2017, 2:40 PM

## 2017-12-03 NOTE — Telephone Encounter (Signed)
SPOKE WITH PT WHO WAS IN A MEETING AND WOULD LIKE TO CONTACT CLINIC BACK AND MAKE AN APPT AT A LATER TIME.

## 2017-12-03 NOTE — Telephone Encounter (Signed)
   [facility] Pre-operative Risk Assessment    Request for surgical clearance:  1. What type of surgery is being performed? Right total knee replacement   2. When is this surgery scheduled? 03/02/18   3. What type of clearance is required (medical clearance vs. Pharmacy clearance to hold med vs. Both)? medical  4. Are there any medications that need to be held prior to surgery and how long?   5. Practice name and name of physician performing surgery? [facility] Dr Marchia Bond  6. What is your office phone [phone]    7.   What is your office fax [phone]  8.   Anesthesia type (None, local, MAC, general) ?    Legrand Como  Kalandra Masters 12/03/2017, 11:08 AM  _________________________________________________________________   (provider comments below)

## 2017-12-04 NOTE — Telephone Encounter (Signed)
   Primary Cardiologist: Dorris Carnes, MD  Chart reviewed as part of pre-operative protocol coverage. Given past medical history and time since last visit, based on ACC/AHA guidelines, [name] would be at acceptable risk for the planned procedure without further cardiovascular testing. Low risk procedure.  On Eliquis but we do not recommend stopping to extract 1 tooth.  She does not need antibiotics.   I will route this recommendation to the requesting party via Epic fax function and remove from pre-op pool.  Please call with questions.  Cecilie Kicks, NP 12/04/2017, 3:18 PM

## 2017-12-21 ENCOUNTER — Inpatient Hospital Stay: Payer: Medicare Other | Attending: Hematology and Oncology

## 2017-12-21 DIAGNOSIS — D472 Monoclonal gammopathy: Secondary | ICD-10-CM | POA: Insufficient documentation

## 2017-12-21 DIAGNOSIS — E538 Deficiency of other specified B group vitamins: Secondary | ICD-10-CM | POA: Insufficient documentation

## 2017-12-21 DIAGNOSIS — D649 Anemia, unspecified: Secondary | ICD-10-CM

## 2017-12-21 MED ORDER — CYANOCOBALAMIN 1000 MCG/ML IJ SOLN
1000.0000 ug | Freq: Once | INTRAMUSCULAR | Status: AC
  Administered 2017-12-21: 1000 ug via INTRAMUSCULAR

## 2017-12-21 NOTE — Patient Instructions (Signed)
Cyanocobalamin, Vitamin B12 injection What is this medicine? CYANOCOBALAMIN (sye an oh koe BAL a min) is a man made form of vitamin B12. Vitamin B12 is used in the growth of healthy blood cells, nerve cells, and proteins in the body. It also helps with the metabolism of fats and carbohydrates. This medicine is used to treat people who can not absorb vitamin B12. This medicine may be used for other purposes; ask your health care provider or pharmacist if you have questions. COMMON BRAND NAME(S): B-12 Compliance Kit, B-12 Injection Kit, Cyomin, LA-12, Nutri-Twelve, Physicians EZ Use B-12, Primabalt What should I tell my health care provider before I take this medicine? They need to know if you have any of these conditions: -kidney disease -Leber's disease -megaloblastic anemia -an unusual or allergic reaction to cyanocobalamin, cobalt, other medicines, foods, dyes, or preservatives -pregnant or trying to get pregnant -breast-feeding How should I use this medicine? This medicine is injected into a muscle or deeply under the skin. It is usually given by a health care professional in a clinic or doctor's office. However, your doctor may teach you how to inject yourself. Follow all instructions. Talk to your pediatrician regarding the use of this medicine in children. Special care may be needed. Overdosage: If you think you have taken too much of this medicine contact a poison control center or emergency room at once. NOTE: This medicine is only for you. Do not share this medicine with others. What if I miss a dose? If you are given your dose at a clinic or doctor's office, call to reschedule your appointment. If you give your own injections and you miss a dose, take it as soon as you can. If it is almost time for your next dose, take only that dose. Do not take double or extra doses. What may interact with this medicine? -colchicine -heavy alcohol intake This list may not describe all possible  interactions. Give your health care provider a list of all the medicines, herbs, non-prescription drugs, or dietary supplements you use. Also tell them if you smoke, drink alcohol, or use illegal drugs. Some items may interact with your medicine. What should I watch for while using this medicine? Visit your doctor or health care professional regularly. You may need blood work done while you are taking this medicine. You may need to follow a special diet. Talk to your doctor. Limit your alcohol intake and avoid smoking to get the best benefit. What side effects may I notice from receiving this medicine? Side effects that you should report to your doctor or health care professional as soon as possible: -allergic reactions like skin rash, itching or hives, swelling of the face, lips, or tongue -blue tint to skin -chest tightness, pain -difficulty breathing, wheezing -dizziness -red, swollen painful area on the leg Side effects that usually do not require medical attention (report to your doctor or health care professional if they continue or are bothersome): -diarrhea -headache This list may not describe all possible side effects. Call your doctor for medical advice about side effects. You may report side effects to FDA at 1-800-FDA-1088. Where should I keep my medicine? Keep out of the reach of children. Store at room temperature between 15 and 30 degrees C (59 and 85 degrees F). Protect from light. Throw away any unused medicine after the expiration date. NOTE: This sheet is a summary. It may not cover all possible information. If you have questions about this medicine, talk to your doctor, pharmacist, or   health care provider.  2018 Elsevier/Gold Standard (2007-08-16 22:10:20)  

## 2017-12-30 ENCOUNTER — Other Ambulatory Visit: Payer: Self-pay | Admitting: Internal Medicine

## 2017-12-30 DIAGNOSIS — Z1231 Encounter for screening mammogram for malignant neoplasm of breast: Secondary | ICD-10-CM

## 2018-01-19 ENCOUNTER — Ambulatory Visit: Payer: Medicare Other

## 2018-01-26 ENCOUNTER — Inpatient Hospital Stay: Payer: Medicare Other | Attending: Hematology and Oncology

## 2018-01-26 VITALS — BP 146/42 | HR 58 | Temp 97.8°F | Resp 18

## 2018-01-26 DIAGNOSIS — D649 Anemia, unspecified: Secondary | ICD-10-CM

## 2018-01-26 DIAGNOSIS — E538 Deficiency of other specified B group vitamins: Secondary | ICD-10-CM | POA: Insufficient documentation

## 2018-01-26 MED ORDER — CYANOCOBALAMIN 1000 MCG/ML IJ SOLN
INTRAMUSCULAR | Status: AC
  Filled 2018-01-26: qty 1

## 2018-01-26 MED ORDER — CYANOCOBALAMIN 1000 MCG/ML IJ SOLN
1000.0000 ug | Freq: Once | INTRAMUSCULAR | Status: AC
  Administered 2018-01-26: 1000 ug via INTRAMUSCULAR

## 2018-02-01 ENCOUNTER — Ambulatory Visit: Payer: Medicare Other

## 2018-02-01 ENCOUNTER — Encounter: Payer: Self-pay | Admitting: Internal Medicine

## 2018-02-01 ENCOUNTER — Ambulatory Visit
Admission: RE | Admit: 2018-02-01 | Discharge: 2018-02-01 | Disposition: A | Discharge status: Home / Self Care | Payer: Medicare Other | Source: Ambulatory Visit | Attending: Internal Medicine | Admitting: Internal Medicine

## 2018-02-01 ENCOUNTER — Ambulatory Visit: Payer: Medicare Other | Admitting: Internal Medicine

## 2018-02-01 VITALS — BP 130/54 | HR 52 | Ht 64.0 in | Wt 165.4 lb

## 2018-02-01 DIAGNOSIS — E782 Mixed hyperlipidemia: Secondary | ICD-10-CM | POA: Diagnosis not present

## 2018-02-01 DIAGNOSIS — Z1231 Encounter for screening mammogram for malignant neoplasm of breast: Secondary | ICD-10-CM

## 2018-02-01 DIAGNOSIS — I480 Paroxysmal atrial fibrillation: Secondary | ICD-10-CM | POA: Diagnosis not present

## 2018-02-01 DIAGNOSIS — I10 Essential (primary) hypertension: Secondary | ICD-10-CM

## 2018-02-01 NOTE — Patient Instructions (Addendum)
Your physician recommends that you continue on your current medications as directed. Please refer to the Current Medication list given to you today. Your physician wants you to follow-up in: [place].  You will receive a reminder letter in the mail two months in advance. If you don't receive a letter, please call our office to schedule the follow-up appointment.  ADDENDUM: spoke with [facility] records for labs.  To be faxed to [phone].

## 2018-02-01 NOTE — Progress Notes (Signed)
Cardiology Office Note   Date:  02/01/2018   ID:  Gloria, [name] 09-23-1940, MRN [id number]  PCP:  Haywood Pao, MD  Cardiologist:   Dorris Carnes, MD   F/U of PAF   History of Present Illness: Gloria [name] is a 77 y.o. female with a history of PAF, HTN, CV dz, HL, DM  Seen in ED on 05/28/17 with palpitations and SOB  Found to be in atrial fibrllation DIfficlut to control  Failed sotalol Placed on flecanide then cardioverted  Reverted to atrial fib   Sent home on amiodarone with outpt f/u for cardioversion   She converted to SR with amio alone   I saw her in April 2019  Since seen breathing is OK   Pt had one spell of atrial fib since seen    Current Meds  Medication Sig  . amiodarone (PACERONE) 200 MG tablet Take 1 tablet (200 mg total) by mouth daily.  Marland Kitchen atorvastatin (LIPITOR) 80 MG tablet Take 80 mg by mouth daily.  . carboxymethylcellulose (REFRESH PLUS) 0.5 % SOLN Place 1 drop into both eyes 3 (three) times daily as needed (for dry/irritated eyes.).   Marland Kitchen diphenhydrAMINE (BENADRYL) 25 MG tablet Take 25 mg by mouth every 6 (six) hours as needed for allergies.  Marland Kitchen ELIQUIS 5 MG TABS tablet TAKE 1 TABLET BY MOUTH TWICE A DAY  . fenofibrate 160 MG tablet Take 160 mg by mouth daily.  . furosemide (LASIX) 40 MG tablet take 1/2 tablet by mouth daily only as needed for swelling  . JANUVIA 100 MG tablet Take 100 mg by mouth daily.  Marland Kitchen LANTUS SOLOSTAR 100 UNIT/ML Solostar Pen Inject 5 Units into the skin at bedtime.   . Menthol, Topical Analgesic, (BIOFREEZE EX) Apply 1 application topically as needed (Back and Knee pain).  . metFORMIN (GLUCOPHAGE) 500 MG tablet Take 500-1,000 mg by mouth 2 (two) times daily. Take 1 tablet (500 mg) by mouth in the morning & Take 2 tablets (1000 mg) by mouth in the evening.  . metoprolol tartrate (LOPRESSOR) 25 MG tablet Take 1.5 tablets (37.5 mg total) by mouth 2 (two) times daily.  Marland Kitchen omeprazole (PRILOSEC) 20 MG capsule Take 20 mg by  mouth daily before breakfast.   . OVER THE COUNTER MEDICATION Stool softner 1 tablet once daily  . potassium chloride SA (K-DUR,KLOR-CON) 20 MEQ tablet Take 1/2 tablet daily for 1 week then take 1/2 tablet only when you take Lasix  . promethazine (PHENERGAN) 25 MG suppository Place 25 mg rectally every 4 (four) hours as needed for nausea or vomiting.  . traMADol (ULTRAM) 50 MG tablet Take 50-100 mg by mouth every 6 (six) hours as needed for pain.     Allergies:   Iodinated diagnostic agents; Iohexol; Hibiclens [chlorhexidine gluconate]; Levaquin [levofloxacin in d5w]; Oxycodone-acetaminophen; and Propoxyphene n-acetaminophen   Past Medical History:  Diagnosis Date  . Arthritis    fingers  . Atrial fibrillation ([place])   . Carotid artery occlusion   . CKD (chronic kidney disease), stage III ([place])   . Diabetes mellitus   . Diabetic retinopathy ([place])   . Dysrhythmia    afib  . Family history of adverse reaction to anesthesia    daughter - violent  . GERD (gastroesophageal reflux disease)   . Hyperlipidemia   . Hypertension   . Macular degeneration    Right eye only  . OSA (obstructive sleep apnea) 11/05/2017   Severe sleep apnea during REM sleep with oxygen saturations  dropping to 78%.  Due to severe nocturnal hypoxemia as well as significant sleep apnea during REM sleep she is now on CPAP at  11 cm H2O  . Peripheral vascular disease ([facility])    hx rt carotid s/p  . Pneumonia    hx, "a long time ago"  . PONV (postoperative nausea and vomiting)   . Sleep apnea     Past Surgical History:  Procedure Laterality Date  . CARDIOVERSION N/A 06/07/2017   Procedure: CARDIOVERSION;  Surgeon: Thompson Grayer, MD;  Location: [facility];  Service: Cardiovascular;  Laterality: N/A;  . CAROTID ENDARTERECTOMY  Jan. 19, 2007   Right  cea  . CHOLECYSTECTOMY  1968  . EYE SURGERY Right    cataract.  Retinal tear- right eye  . LUMBAR LAMINECTOMY/DECOMPRESSION MICRODISCECTOMY  02/27/2012   Procedure: LUMBAR  LAMINECTOMY/DECOMPRESSION MICRODISCECTOMY 2 LEVELS;  Surgeon: Kristeen Miss, MD;  Location: [facility];  Service: Neurosurgery;  Laterality: N/A;  Lumbar two-three, four-five Laminectomy/foraminotomy  . SPINAL CORD DECOMPRESSION  Oct. 2013     Social History:  The patient  reports that she quit smoking about 13 years ago. Her smoking use included cigarettes. She has a 8.00 pack-year smoking history. She has never used smokeless tobacco. She reports that she does not drink alcohol or use drugs.   Family History:  The patient's family history includes Breast cancer (age of onset: 50) in her daughter; Heart disease in her father, mother, and sister.    ROS:  Please see the history of present illness. All other systems are reviewed and  Negative to the above problem except as noted.    PHYSICAL EXAM: VS:  BP (!) 130/54   Pulse (!) 52   Ht 5\' 4"  (1.626 m)   Wt 165 lb 6.4 oz (75 kg)   SpO2 99%   BMI 28.39 kg/m   GEN: Well nourished, well developed, in no acute distress  HEENT: normal  Neck:   JVP is normal    Cardiac: RRR; no murmurs, rubs, or gallops,no signif edema  Respiratory:  clear to auscultation bilaterally, normal work of breathing GI: soft, nontender, nondistended, + BS  No hepatomegaly  MS: no deformity Moving all extremities   Skin: warm and dry, no rash Neuro:  Strength and sensation are intact Psych: euthymic mood, full affect   EKG:  EKG is not ordered today.   Lipid Panel    Component Value Date/Time   CHOL 113 06/02/2017 0514   TRIG 180 (H) 06/02/2017 0514   HDL 14 (L) 06/02/2017 0514   CHOLHDL 8.1 06/02/2017 0514   VLDL 36 06/02/2017 0514   LDLCALC 63 06/02/2017 0514      Wt Readings from Last 3 Encounters:  02/01/18 165 lb 6.4 oz (75 kg)  11/16/17 165 lb 4.8 oz (75 kg)  11/05/17 166 lb 12.8 oz (75.7 kg)      ASSESSMENT AND PLAN:  1   PAF  I would keep on current regimen   Check BMET   2  HTN  BP has been up/down in past   OK today   Continue  meds   3  HL  Continue lipitor and fenofibrate  In Jan 2019 LDL 36   HDL 189    4   MGUS   Followed in Heme clinic   5   Ortho    Plan for knee replacement   From a cardiac standpoitn I think she is low risk   OK to proceed.  Would hold Eliquis 2 days prior to surgery      4  Preop eval  Pt being considered for back surgery   She is now over 3 wks from clinic visit when she was noted to be in [place]   OK to hold eliquis in periop period   Resume after      Current medicines are reviewed at length with the patient today.  The patient does not have concerns regarding medicines.  Signed, Dorris Carnes, MD  02/01/2018 11:02 AM    [facility] [place], [place], [place]  [address] Phone: [phone]; Fax: [phone]

## 2018-02-14 NOTE — Assessment & Plan Note (Signed)
09/25/2017: Hemoglobin 10.4, MCV 99, ferritin 102 patient was already on oral iron replacement therapy  Hospitalization April 2019: Back surgery requiring blood transfusion, hemoglobin dipped into 8.5. Iron studies were not done but she was placed on oral iron therapy   Differential diagnosis: 1. Anemia due to chronic disease and inflammation 2. anemia due to renal dysfunction 3. Combined B-12 and iron deficiency anemias 4. Hemolysis 5. Hypothyroidism 6. Plasma cell disorders myeloma 7. Bone marrow dysfunction with MDS  Lab review of 11/16/2017: Hemoglobin 10.7, MCV 92 previously. B12 95 June 2019: M protein: 0.5 g IgM  Lab Review: 02/15/18  Marked deficiency of B12: On monthly B12 injections.  She had a remarkable improvement in her energy levels.  Bone survey: few translucencies non specific

## 2018-02-15 ENCOUNTER — Inpatient Hospital Stay: Payer: Medicare Other

## 2018-02-15 ENCOUNTER — Telehealth: Payer: Self-pay | Admitting: Hematology and Oncology

## 2018-02-15 ENCOUNTER — Inpatient Hospital Stay: Payer: Medicare Other | Admitting: Hematology and Oncology

## 2018-02-15 VITALS — BP 141/42 | HR 58 | Temp 98.2°F | Resp 17 | Ht 64.0 in | Wt 167.9 lb

## 2018-02-15 DIAGNOSIS — D518 Other vitamin B12 deficiency anemias: Secondary | ICD-10-CM

## 2018-02-15 DIAGNOSIS — E538 Deficiency of other specified B group vitamins: Secondary | ICD-10-CM | POA: Diagnosis not present

## 2018-02-15 DIAGNOSIS — E119 Type 2 diabetes mellitus without complications: Secondary | ICD-10-CM

## 2018-02-15 DIAGNOSIS — D508 Other iron deficiency anemias: Secondary | ICD-10-CM | POA: Diagnosis not present

## 2018-02-15 DIAGNOSIS — N183 Chronic kidney disease, stage 3 unspecified: Secondary | ICD-10-CM

## 2018-02-15 DIAGNOSIS — D649 Anemia, unspecified: Secondary | ICD-10-CM

## 2018-02-15 DIAGNOSIS — Z862 Personal history of diseases of the blood and blood-forming organs and certain disorders involving the immune mechanism: Secondary | ICD-10-CM

## 2018-02-15 DIAGNOSIS — Z79899 Other long term (current) drug therapy: Secondary | ICD-10-CM

## 2018-02-15 DIAGNOSIS — D472 Monoclonal gammopathy: Secondary | ICD-10-CM

## 2018-02-15 DIAGNOSIS — Z794 Long term (current) use of insulin: Secondary | ICD-10-CM

## 2018-02-15 LAB — CBC WITH DIFFERENTIAL (CANCER CENTER ONLY)
Basophils Absolute: 0.1 10*3/uL (ref 0.0–0.1)
Basophils Relative: 1 %
EOS ABS: 0.2 10*3/uL (ref 0.0–0.5)
EOS PCT: 2 %
HCT: 30.5 % — ABNORMAL LOW (ref 34.8–46.6)
Hemoglobin: 10 g/dL — ABNORMAL LOW (ref 11.6–15.9)
LYMPHS ABS: 1.6 10*3/uL (ref 0.9–3.3)
LYMPHS PCT: 22 %
MCH: 29 pg (ref 25.1–34.0)
MCHC: 32.9 g/dL (ref 31.5–36.0)
MCV: 88.3 fL (ref 79.5–101.0)
MONO ABS: 0.6 10*3/uL (ref 0.1–0.9)
Monocytes Relative: 8 %
Neutro Abs: 4.8 10*3/uL (ref 1.5–6.5)
Neutrophils Relative %: 67 %
PLATELETS: 250 10*3/uL (ref 145–400)
RBC: 3.46 MIL/uL — ABNORMAL LOW (ref 3.70–5.45)
RDW: 15.2 % — AB (ref 11.2–14.5)
WBC: 7.3 10*3/uL (ref 3.9–10.3)

## 2018-02-15 LAB — CMP (CANCER CENTER ONLY)
ALBUMIN: 3.3 g/dL — AB (ref 3.5–5.0)
ALT: 33 U/L (ref 0–44)
AST: 35 U/L (ref 15–41)
Alkaline Phosphatase: 63 U/L (ref 38–126)
Anion gap: 9 (ref 5–15)
BILIRUBIN TOTAL: 0.3 mg/dL (ref 0.3–1.2)
BUN: 27 mg/dL — AB (ref 8–23)
CHLORIDE: 105 mmol/L (ref 98–111)
CO2: 28 mmol/L (ref 22–32)
CREATININE: 1.6 mg/dL — AB (ref 0.44–1.00)
Calcium: 8.5 mg/dL — ABNORMAL LOW (ref 8.9–10.3)
GFR, EST NON AFRICAN AMERICAN: 30 mL/min — AB (ref >60)
GFR, Est AFR Am: 35 mL/min — ABNORMAL LOW (ref >60)
GLUCOSE: 309 mg/dL — AB (ref 70–99)
POTASSIUM: 3.9 mmol/L (ref 3.5–5.1)
Sodium: 142 mmol/L (ref 135–145)
Total Protein: 7.2 g/dL (ref 6.5–8.1)

## 2018-02-15 MED ORDER — DOCUSATE SODIUM 100 MG PO CAPS: 100.0000 mg | ORAL_CAPSULE | Freq: Every day | ORAL | 0 refills | Status: DC | PRN

## 2018-02-15 MED ORDER — CYANOCOBALAMIN 1000 MCG/ML IJ SOLN
INTRAMUSCULAR | Status: AC
  Filled 2018-02-15: qty 1

## 2018-02-15 MED ORDER — CYANOCOBALAMIN 1000 MCG/ML IJ SOLN: 1000.0000 ug | INTRAMUSCULAR | 0 refills | Status: DC

## 2018-02-15 MED ORDER — HYDROCHLOROTHIAZIDE 25 MG PO TABS: 25.0000 mg | ORAL_TABLET | Freq: Every day | ORAL | Status: DC

## 2018-02-15 MED ORDER — NEEDLES & SYRINGES MISC: 1.0000 [IU] | 0 refills | Status: AC

## 2018-02-15 MED ORDER — CYANOCOBALAMIN 1000 MCG/ML IJ SOLN
1000.0000 ug | Freq: Once | INTRAMUSCULAR | Status: AC
  Administered 2018-02-15: 1000 ug via INTRAMUSCULAR

## 2018-02-15 NOTE — Progress Notes (Signed)
Patient Care Team: Tisovec, Fransico Him, MD as PCP - General (Internal Medicine) Fay Records, MD as PCP - Cardiology (Cardiology) Marchia Bond, MD as Consulting Physician (Orthopedic Surgery)  DIAGNOSIS:  Encounter Diagnosis  Name Primary?  Marland Kitchen MGUS (monoclonal gammopathy of unknown significance)    CHIEF COMPLIANT: Follow-up of MGUS to review the blood work  INTERVAL HISTORY: Gloria [name] is a 77 year old with above-mentioned history of MGUS and chronic anemia.  She was diagnosed with profound B12 deficiency and is now receiving B12 injections for the past 3 months.  She has noticed remarkable improvement in her energy levels.  She is able to function better.  Take care of her garden and even cook intermittently.  This is been a significant improvement in her quality of life.  In spite of that if hemoglobin has remained low at 10 g.  We suspect anemia of chronic kidney disease.  Her diabetes is under excellent control.  REVIEW OF SYSTEMS:   Constitutional: Denies fevers, chills or abnormal weight loss Eyes: Denies blurriness of vision Ears, nose, mouth, throat, and face: Denies mucositis or sore throat Respiratory: Denies cough, dyspnea or wheezes Cardiovascular: Denies palpitation, chest discomfort Gastrointestinal:  Denies nausea, heartburn or change in bowel habits Skin: Denies abnormal skin rashes Lymphatics: Denies new lymphadenopathy or easy bruising Neurological:Denies numbness, tingling or new weaknesses Behavioral/Psych: Mood is stable, no new changes  Extremities: No lower extremity edema  All other systems were reviewed with the patient and are negative.  I have reviewed the past medical history, past surgical history, social history and family history with the patient and they are unchanged from previous note.  ALLERGIES:  is allergic to iodinated diagnostic agents; iohexol; hibiclens [chlorhexidine gluconate]; levaquin [levofloxacin in d5w];  oxycodone-acetaminophen; and propoxyphene n-acetaminophen.  MEDICATIONS:  Current Outpatient Medications  Medication Sig Dispense Refill  . amiodarone (PACERONE) 200 MG tablet Take 1 tablet (200 mg total) by mouth daily. 30 tablet 3  . atorvastatin (LIPITOR) 80 MG tablet Take 80 mg by mouth daily.    . carboxymethylcellulose (REFRESH PLUS) 0.5 % SOLN Place 1 drop into both eyes 3 (three) times daily as needed (for dry/irritated eyes.).     Marland Kitchen cyanocobalamin (,VITAMIN B-12,) 1000 MCG/ML injection Inject 1 mL (1,000 mcg total) into the muscle every 30 (thirty) days for 10 doses. 10 mL 0  . diphenhydrAMINE (BENADRYL) 25 MG tablet Take 25 mg by mouth every 6 (six) hours as needed for allergies.    Marland Kitchen docusate sodium (STOOL SOFTENER) 100 MG capsule Take 1 capsule (100 mg total) by mouth daily as needed for mild constipation. 10 capsule 0  . ELIQUIS 5 MG TABS tablet TAKE 1 TABLET BY MOUTH TWICE A DAY 60 tablet 6  . fenofibrate 160 MG tablet Take 160 mg by mouth daily.    . furosemide (LASIX) 40 MG tablet take 1/2 tablet by mouth daily only as needed for swelling 45 tablet 3  . hydrochlorothiazide (HYDRODIURIL) 25 MG tablet Take 1 tablet (25 mg total) by mouth daily.    Marland Kitchen JANUVIA 100 MG tablet Take 100 mg by mouth daily.  1  . LANTUS SOLOSTAR 100 UNIT/ML Solostar Pen Inject 5 Units into the skin at bedtime.     . Menthol, Topical Analgesic, (BIOFREEZE EX) Apply 1 application topically as needed (Back and Knee pain).    . metoprolol tartrate (LOPRESSOR) 25 MG tablet Take 1.5 tablets (37.5 mg total) by mouth 2 (two) times daily. 90 tablet 6  . Needles &  Syringes MISC 1 Units by Does not apply route every 30 (thirty) days.  0  . omeprazole (PRILOSEC) 20 MG capsule Take 20 mg by mouth daily before breakfast.     . OVER THE COUNTER MEDICATION Stool softner 1 tablet once daily    . potassium chloride SA (K-DUR,KLOR-CON) 20 MEQ tablet Take 1/2 tablet daily for 1 week then take 1/2 tablet only when you take  Lasix 90 tablet 3  . promethazine (PHENERGAN) 25 MG suppository Place 25 mg rectally every 4 (four) hours as needed for nausea or vomiting.    . traMADol (ULTRAM) 50 MG tablet Take 50-100 mg by mouth every 6 (six) hours as needed for pain.  0   No current facility-administered medications for this visit.     PHYSICAL EXAMINATION: ECOG PERFORMANCE STATUS: 1 - Symptomatic but completely ambulatory  Vitals:   02/15/18 0916  BP: (!) 141/42  Pulse: (!) 58  Resp: 17  Temp: 98.2 F (36.8 C)  SpO2: 98%   Filed Weights   02/15/18 0916  Weight: 167 lb 14.4 oz (76.2 kg)    GENERAL:alert, no distress and comfortable SKIN: skin color, texture, turgor are normal, no rashes or significant lesions EYES: normal, Conjunctiva are pink and non-injected, sclera clear OROPHARYNX:no exudate, no erythema and lips, buccal mucosa, and tongue normal  NECK: supple, thyroid normal size, non-tender, without nodularity LYMPH:  no palpable lymphadenopathy in the cervical, axillary or inguinal LUNGS: clear to auscultation and percussion with normal breathing effort HEART: regular rate & rhythm and no murmurs and no lower extremity edema ABDOMEN:abdomen soft, non-tender and normal bowel sounds MUSCULOSKELETAL:no cyanosis of digits and no clubbing  NEURO: alert & oriented x 3 with fluent speech, no focal motor/sensory deficits EXTREMITIES: No lower extremity edema   LABORATORY DATA:  I have reviewed the data as listed CMP Latest Ref Rng & Units 10/08/2017 09/14/2017 09/09/2017  Glucose 65 - 99 mg/dL 117(H) 116(H) 115(H)  BUN 8 - 27 mg/dL 25 23 31(H)  Creatinine 0.57 - 1.00 mg/dL 1.71(H) 1.43(H) 2.03(H)  Sodium 134 - 144 mmol/L 145(H) 146(H) 138  Potassium 3.5 - 5.2 mmol/L 4.4 4.8 4.2  Chloride 96 - 106 mmol/L 98 106 105  CO2 20 - 29 mmol/L 27 26 25   Calcium 8.7 - 10.3 mg/dL 7.9(L) 8.4(L) 7.9(L)    Lab Results  Component Value Date   WBC 7.3 02/15/2018   HGB 10.0 (L) 02/15/2018   HCT 30.5 (L)  02/15/2018   MCV 88.3 02/15/2018   PLT 250 02/15/2018   NEUTROABS 4.8 02/15/2018    ASSESSMENT & PLAN:  MGUS (monoclonal gammopathy of unknown significance) 09/25/2017: Hemoglobin 10.4, MCV 99, ferritin 102 patient was already on oral iron replacement therapy hospitalization April 2019: Back surgery requiring blood transfusion, hemoglobin dipped into 8.5. Iron studies were not done but she was placed on oral iron therapy   Differential diagnosis: 1. Anemia due to chronic disease and inflammation 2. anemia due to renal dysfunction 3. Combined B-12 and iron deficiency anemias 4. Hemolysis 5. Hypothyroidism 6. Plasma cell disorders myeloma 7. Bone marrow dysfunction with MDS  Lab review of 11/16/2017: Hemoglobin 10.7, MCV 92 previously. B12 95 July 2019: M protein: 0.6 g IgM lambda  Marked deficiency of B12: On monthly B12 injections.  She had a remarkable improvement in her energy levels. I sent a prescription for B12 injections to be given by herself at home.  She is used to giving insulin injections that she thinks she can handle it.  Bone survey: few translucencies non specific Lab review 02/15/2018: Hemoglobin 10, MCV 88.3 Because her performance status has improved significantly, we are not starting her on any new therapy like Aranesp or Procrit.  Return to clinic in 3 months with labs and follow-up  Orders Placed This Encounter  Procedures  . CBC with Differential (Cancer Center Only)    Standing Status:   Future    Standing Expiration Date:   02/16/2019  . Iron and TIBC    Standing Status:   Future    Standing Expiration Date:   02/15/2019  . Ferritin    Standing Status:   Future    Standing Expiration Date:   02/15/2019  . Reticulocytes    Standing Status:   Future    Standing Expiration Date:   02/16/2019   The patient has a good understanding of the overall plan. she agrees with it. she will call with any problems that may develop before the next visit here.    Harriette Ohara, MD 02/15/18

## 2018-02-15 NOTE — Telephone Encounter (Signed)
Gave avs and calendar ° °

## 2018-02-15 NOTE — Patient Instructions (Signed)
Cyanocobalamin, Vitamin B12 injection What is this medicine? CYANOCOBALAMIN (sye an oh koe BAL a min) is a man made form of vitamin B12. Vitamin B12 is used in the growth of healthy blood cells, nerve cells, and proteins in the body. It also helps with the metabolism of fats and carbohydrates. This medicine is used to treat people who can not absorb vitamin B12. This medicine may be used for other purposes; ask your health care provider or pharmacist if you have questions. COMMON BRAND NAME(S): B-12 Compliance Kit, B-12 Injection Kit, Cyomin, LA-12, Nutri-Twelve, Physicians EZ Use B-12, Primabalt What should I tell my health care provider before I take this medicine? They need to know if you have any of these conditions: -kidney disease -Leber's disease -megaloblastic anemia -an unusual or allergic reaction to cyanocobalamin, cobalt, other medicines, foods, dyes, or preservatives -pregnant or trying to get pregnant -breast-feeding How should I use this medicine? This medicine is injected into a muscle or deeply under the skin. It is usually given by a health care professional in a clinic or doctor's office. However, your doctor may teach you how to inject yourself. Follow all instructions. Talk to your pediatrician regarding the use of this medicine in children. Special care may be needed. Overdosage: If you think you have taken too much of this medicine contact a poison control center or emergency room at once. NOTE: This medicine is only for you. Do not share this medicine with others. What if I miss a dose? If you are given your dose at a clinic or doctor's office, call to reschedule your appointment. If you give your own injections and you miss a dose, take it as soon as you can. If it is almost time for your next dose, take only that dose. Do not take double or extra doses. What may interact with this medicine? -colchicine -heavy alcohol intake This list may not describe all possible  interactions. Give your health care provider a list of all the medicines, herbs, non-prescription drugs, or dietary supplements you use. Also tell them if you smoke, drink alcohol, or use illegal drugs. Some items may interact with your medicine. What should I watch for while using this medicine? Visit your doctor or health care professional regularly. You may need blood work done while you are taking this medicine. You may need to follow a special diet. Talk to your doctor. Limit your alcohol intake and avoid smoking to get the best benefit. What side effects may I notice from receiving this medicine? Side effects that you should report to your doctor or health care professional as soon as possible: -allergic reactions like skin rash, itching or hives, swelling of the face, lips, or tongue -blue tint to skin -chest tightness, pain -difficulty breathing, wheezing -dizziness -red, swollen painful area on the leg Side effects that usually do not require medical attention (report to your doctor or health care professional if they continue or are bothersome): -diarrhea -headache This list may not describe all possible side effects. Call your doctor for medical advice about side effects. You may report side effects to FDA at 1-800-FDA-1088. Where should I keep my medicine? Keep out of the reach of children. Store at room temperature between 15 and 30 degrees C (59 and 85 degrees F). Protect from light. Throw away any unused medicine after the expiration date. NOTE: This sheet is a summary. It may not cover all possible information. If you have questions about this medicine, talk to your doctor, pharmacist, or   health care provider.  2018 Elsevier/Gold Standard (2007-08-16 22:10:20)  

## 2018-02-16 LAB — KAPPA/LAMBDA LIGHT CHAINS
KAPPA FREE LGHT CHN: 57.9 mg/L — AB (ref 3.3–19.4)
Kappa, lambda light chain ratio: 1.83 — ABNORMAL HIGH (ref 0.26–1.65)
Lambda free light chains: 31.6 mg/L — ABNORMAL HIGH (ref 5.7–26.3)

## 2018-02-17 ENCOUNTER — Other Ambulatory Visit: Payer: Self-pay | Admitting: Orthopedic Surgery

## 2018-02-17 ENCOUNTER — Telehealth: Payer: Self-pay | Admitting: Hematology and Oncology

## 2018-02-17 LAB — MULTIPLE MYELOMA PANEL, SERUM
Albumin SerPl Elph-Mcnc: 3.4 g/dL (ref 2.9–4.4)
Albumin/Glob SerPl: 1.1 (ref 0.7–1.7)
Alpha 1: 0.3 g/dL (ref 0.0–0.4)
Alpha2 Glob SerPl Elph-Mcnc: 0.7 g/dL (ref 0.4–1.0)
B-GLOBULIN SERPL ELPH-MCNC: 0.9 g/dL (ref 0.7–1.3)
Gamma Glob SerPl Elph-Mcnc: 1.4 g/dL (ref 0.4–1.8)
Globulin, Total: 3.3 g/dL (ref 2.2–3.9)
IGA: 230 mg/dL (ref 64–422)
IgG (Immunoglobin G), Serum: 1073 mg/dL (ref 700–1600)
IgM (Immunoglobulin M), Srm: 648 mg/dL — ABNORMAL HIGH (ref 26–217)
M Protein SerPl Elph-Mcnc: 0.3 g/dL — ABNORMAL HIGH
TOTAL PROTEIN ELP: 6.7 g/dL (ref 6.0–8.5)

## 2018-02-17 NOTE — Telephone Encounter (Signed)
I called and informed the patient that the M protein is down from 0.6 g to 0.2 g

## 2018-02-22 ENCOUNTER — Other Ambulatory Visit (HOSPITAL_COMMUNITY): Payer: Self-pay | Admitting: Nurse Practitioner

## 2018-02-22 NOTE — Patient Instructions (Addendum)
Gloria [name]  [birth year]    Your procedure is scheduled on:   03-02-2018      Report to [facility] Main  Entrance,  Report to admitting at  5:30 AM    Call this number if you have problems the morning of surgery [phone]     Remember: Do not eat food or drink liquids :After Midnight.               BRUSH YOUR TEETH MORNING OF SURGERY AND RINSE YOUR MOUTH OUT, NO CHEWING GUM CANDY OR MINTS.     Take these medicines the morning of surgery with A SIP OF WATER:   Metoprolol, Omeprazole, Atorvastatin, fenofibrate                                You may not have any metal on your body including hair pins and              piercings               Do not wear jewelry, make-up, lotions, powders or perfumes, deodorant                          Do not wear nail polish.  Do not shave  48 hours prior to surgery.             Do not bring valuables to the hospital. [place].  Contacts, dentures or bridgework may not be worn into surgery.  Leave suitcase in the car. After surgery it may be brought to your room.     Special Instructions:  BRING CPAP MASK AND TUBING WITH YOU DAY OF SURGERY             _____________________________________________________________________             How to Manage Your Diabetes Before and After Surgery  Why is it important to control my blood sugar before and after surgery? . Improving blood sugar levels before and after surgery helps healing and can limit problems. . A way of improving blood sugar control is eating a healthy diet by: o  Eating less sugar and carbohydrates o  Increasing activity/exercise o  Talking with your doctor about reaching your blood sugar goals . High blood sugars (greater than 180 mg/dL) can raise your risk of infections and slow your recovery, so you will need to focus on controlling your diabetes during the weeks before surgery. . Make sure  that the doctor who takes care of your diabetes knows about your planned surgery including the date and location.  How do I manage my blood sugar before surgery? . Check your blood sugar at least 4 times a day, starting 2 days before surgery, to make sure that the level is not too high or low. o Check your blood sugar the morning of your surgery when you wake up and every 2 hours until you get to the Short Stay unit. . If your blood sugar is less than 70 mg/dL, you will need to treat for low blood sugar: o Do not take insulin. o Treat a low blood sugar (less than 70 mg/dL) with  cup of clear juice (cranberry or  apple), 4 glucose tablets, OR glucose gel. o Recheck blood sugar in 15 minutes after treatment (to make sure it is greater than 70 mg/dL). If your blood sugar is not greater than 70 mg/dL on recheck, call [phone] for further instructions. . Report your blood sugar to the short stay nurse when you get to Short Stay.  . If you are admitted to the hospital after surgery: o Your blood sugar will be checked by the staff and you will probably be given insulin after surgery (instead of oral diabetes medicines) to make sure you have good blood sugar levels. o The goal for blood sugar control after surgery is 80-180 mg/dL.   WHAT DO I DO ABOUT MY DIABETES MEDICATION?  Marland Kitchen Do not take oral diabetes medicines (pills) the morning of surgery.  . THE NIGHT BEFORE SURGERY,  Call your primary doctor, dr Osborne Casco, and follow his instructions for lantus insulin prior to surgery      WHAT IS A BLOOD TRANSFUSION? Blood Transfusion Information  A transfusion is the replacement of blood or some of its parts. Blood is made up of multiple cells which provide different functions.  Red blood cells carry oxygen and are used for blood loss replacement.  White blood cells fight against infection.  Platelets control bleeding.  Plasma helps clot blood.  Other blood products are available for  specialized needs, such as hemophilia or other clotting disorders. BEFORE THE TRANSFUSION  Who gives blood for transfusions?   Healthy volunteers who are fully evaluated to make sure their blood is safe. This is blood bank blood. Transfusion therapy is the safest it has ever been in the practice of medicine. Before blood is taken from a donor, a complete history is taken to make sure that person has no history of diseases nor engages in risky social behavior (examples are intravenous drug use or sexual activity with multiple partners). The donor's travel history is screened to minimize risk of transmitting infections, such as malaria. The donated blood is tested for signs of infectious diseases, such as HIV and hepatitis. The blood is then tested to be sure it is compatible with you in order to minimize the chance of a transfusion reaction. If you or a relative donates blood, this is often done in anticipation of surgery and is not appropriate for emergency situations. It takes many days to process the donated blood. RISKS AND COMPLICATIONS Although transfusion therapy is very safe and saves many lives, the main dangers of transfusion include:   Getting an infectious disease.  Developing a transfusion reaction. This is an allergic reaction to something in the blood you were given. Every precaution is taken to prevent this. The decision to have a blood transfusion has been considered carefully by your caregiver before blood is given. Blood is not given unless the benefits outweigh the risks. AFTER THE TRANSFUSION  Right after receiving a blood transfusion, you will usually feel much better and more energetic. This is especially true if your red blood cells have gotten low (anemic). The transfusion raises the level of the red blood cells which carry oxygen, and this usually causes an energy increase.  The nurse administering the transfusion will monitor you carefully for complications. HOME CARE  INSTRUCTIONS  No special instructions are needed after a transfusion. You may find your energy is better. Speak with your caregiver about any limitations on activity for underlying diseases you may have. SEEK MEDICAL CARE IF:   Your condition is not improving after your transfusion.  You develop redness or irritation at the intravenous (IV) site. SEEK IMMEDIATE MEDICAL CARE IF:  Any of the following symptoms occur over the next 12 hours:  Shaking chills.  You have a temperature by mouth above 102 F (38.9 C), not controlled by medicine.  Chest, back, or muscle pain.  People around you feel you are not acting correctly or are confused.  Shortness of breath or difficulty breathing.  Dizziness and fainting.  You get a rash or develop hives.  You have a decrease in urine output.  Your urine turns a dark color or changes to pink, red, or brown. Any of the following symptoms occur over the next 10 days:  You have a temperature by mouth above 102 F (38.9 C), not controlled by medicine.  Shortness of breath.  Weakness after normal activity.  The white part of the eye turns yellow (jaundice).  You have a decrease in the amount of urine or are urinating less often.  Your urine turns a dark color or changes to pink, red, or brown. Document Released: 05/02/2000 Document Revised: 07/28/2011 Document Reviewed: 12/20/2007 ExitCare Patient Information 2014 [place].  _______________________________________________________________________  Incentive Spirometer  An incentive spirometer is a tool that can help keep your lungs clear and active. This tool measures how well you are filling your lungs with each breath. Taking long deep breaths may help reverse or decrease the chance of developing breathing (pulmonary) problems (especially infection) following:  A long period of time when you are unable to move or be active. BEFORE THE PROCEDURE   If the spirometer includes an  indicator to show your best effort, your nurse or respiratory therapist will set it to a desired goal.  If possible, sit up straight or lean slightly forward. Try not to slouch.  Hold the incentive spirometer in an upright position. INSTRUCTIONS FOR USE  1. Sit on the edge of your bed if possible, or sit up as far as you can in bed or on a chair. 2. Hold the incentive spirometer in an upright position. 3. Breathe out normally. 4. Place the mouthpiece in your mouth and seal your lips tightly around it. 5. Breathe in slowly and as deeply as possible, raising the piston or the ball toward the top of the column. 6. Hold your breath for 3-5 seconds or for as long as possible. Allow the piston or ball to fall to the bottom of the column. 7. Remove the mouthpiece from your mouth and breathe out normally. 8. Rest for a few seconds and repeat Steps 1 through 7 at least 10 times every 1-2 hours when you are awake. Take your time and take a few normal breaths between deep breaths. 9. The spirometer may include an indicator to show your best effort. Use the indicator as a goal to work toward during each repetition. 10. After each set of 10 deep breaths, practice coughing to be sure your lungs are clear. If you have an incision (the cut made at the time of surgery), support your incision when coughing by placing a pillow or rolled up towels firmly against it. Once you are able to get out of bed, walk around indoors and cough well. You may stop using the incentive spirometer when instructed by your caregiver.  RISKS AND COMPLICATIONS  Take your time so you do not get dizzy or light-headed.  If you are in pain, you may need to take or ask for pain medication before doing incentive spirometry. It is harder  to take a deep breath if you are having pain. AFTER USE  Rest and breathe slowly and easily.  It can be helpful to keep track of a log of your progress. Your caregiver can provide you with a simple table  to help with this. If you are using the spirometer at home, follow these instructions: [place] IF:   You are having difficultly using the spirometer.  You have trouble using the spirometer as often as instructed.  Your pain medication is not giving enough relief while using the spirometer.  You develop fever of 100.5 F (38.1 C) or higher. SEEK IMMEDIATE MEDICAL CARE IF:   You cough up bloody sputum that had not been present before.  You develop fever of 102 F (38.9 C) or greater.  You develop worsening pain at or near the incision site. MAKE SURE YOU:   Understand these instructions.  Will watch your condition.  Will get help right away if you are not doing well or get worse. Document Released: 09/15/2006 Document Revised: 07/28/2011 Document Reviewed: 11/16/2006 ExitCare Patient Information 2014 [place].   ________________________________________________________________________  [facility] - Preparing for Surgery Before surgery, you can play an important role.  Because skin is not sterile, your skin needs to be as free of germs as possible.  You can reduce the number of germs on your skin by washing with CHG (chlorahexidine gluconate) soap before surgery.  CHG is an antiseptic cleaner which kills germs and bonds with the skin to continue killing germs even after washing. Please DO NOT use if you have an allergy to CHG or antibacterial soaps.  If your skin becomes reddened/irritated stop using the CHG and inform your nurse when you arrive at Short Stay. Do not shave (including legs and underarms) for at least 48 hours prior to the first CHG shower.  You may shave your face/neck. Please follow these instructions carefully:  1.  Shower with CHG Soap the night before surgery and the  morning of Surgery.  2.  If you choose to wash your hair, wash your hair first as usual with your  normal  shampoo.  3.  After you shampoo, rinse your hair and body thoroughly to  remove the  shampoo.                            4.  Use CHG as you would any other liquid soap.  You can apply chg directly  to the skin and wash                       Gently with a scrungie or clean washcloth.  5.  Apply the CHG Soap to your body ONLY FROM THE NECK DOWN.   Do not use on face/ open                           Wound or open sores. Avoid contact with eyes, ears mouth and genitals (private parts).                       Wash face,  Genitals (private parts) with your normal soap.             6.  Wash thoroughly, paying special attention to the area where your surgery  will be performed.  7.  Thoroughly rinse your body with warm water from the  neck down.  8.  DO NOT shower/wash with your normal soap after using and rinsing off  the CHG Soap.             9.  Pat yourself dry with a clean towel.            10.  Wear clean pajamas.            11.  Place clean sheets on your bed the night of your first shower and do not  sleep with pets. Day of Surgery : Do not apply any lotions/deodorants the morning of surgery.  Please wear clean clothes to the hospital/surgery center.  FAILURE TO FOLLOW THESE INSTRUCTIONS MAY RESULT IN THE CANCELLATION OF YOUR SURGERY PATIENT SIGNATURE_________________________________  NURSE SIGNATURE__________________________________  ________________________________________________________________________

## 2018-02-23 ENCOUNTER — Other Ambulatory Visit: Payer: Self-pay

## 2018-02-23 ENCOUNTER — Encounter (HOSPITAL_COMMUNITY): Payer: Self-pay

## 2018-02-23 ENCOUNTER — Encounter (HOSPITAL_COMMUNITY)
Admission: RE | Admit: 2018-02-23 | Discharge: 2018-02-23 | Disposition: A | Discharge status: Home / Self Care | Payer: Medicare Other | Source: Ambulatory Visit | Attending: Orthopedic Surgery | Admitting: Orthopedic Surgery

## 2018-02-23 DIAGNOSIS — Z794 Long term (current) use of insulin: Secondary | ICD-10-CM | POA: Insufficient documentation

## 2018-02-23 DIAGNOSIS — E1151 Type 2 diabetes mellitus with diabetic peripheral angiopathy without gangrene: Secondary | ICD-10-CM | POA: Insufficient documentation

## 2018-02-23 DIAGNOSIS — E1140 Type 2 diabetes mellitus with diabetic neuropathy, unspecified: Secondary | ICD-10-CM | POA: Diagnosis not present

## 2018-02-23 DIAGNOSIS — M13861 Other specified arthritis, right knee: Secondary | ICD-10-CM | POA: Diagnosis not present

## 2018-02-23 DIAGNOSIS — I5032 Chronic diastolic (congestive) heart failure: Secondary | ICD-10-CM | POA: Insufficient documentation

## 2018-02-23 DIAGNOSIS — E11319 Type 2 diabetes mellitus with unspecified diabetic retinopathy without macular edema: Secondary | ICD-10-CM | POA: Insufficient documentation

## 2018-02-23 DIAGNOSIS — Z7901 Long term (current) use of anticoagulants: Secondary | ICD-10-CM | POA: Insufficient documentation

## 2018-02-23 DIAGNOSIS — I130 Hypertensive heart and chronic kidney disease with heart failure and stage 1 through stage 4 chronic kidney disease, or unspecified chronic kidney disease: Secondary | ICD-10-CM | POA: Diagnosis not present

## 2018-02-23 DIAGNOSIS — Z79899 Other long term (current) drug therapy: Secondary | ICD-10-CM | POA: Diagnosis not present

## 2018-02-23 DIAGNOSIS — N183 Chronic kidney disease, stage 3 (moderate): Secondary | ICD-10-CM | POA: Insufficient documentation

## 2018-02-23 DIAGNOSIS — E1122 Type 2 diabetes mellitus with diabetic chronic kidney disease: Secondary | ICD-10-CM | POA: Insufficient documentation

## 2018-02-23 DIAGNOSIS — I480 Paroxysmal atrial fibrillation: Secondary | ICD-10-CM | POA: Insufficient documentation

## 2018-02-23 DIAGNOSIS — Z01818 Encounter for other preprocedural examination: Secondary | ICD-10-CM | POA: Insufficient documentation

## 2018-02-23 HISTORY — DX: Type 2 diabetes mellitus without complications: E11.9

## 2018-02-23 HISTORY — DX: Deficiency of other specified B group vitamins: E53.8

## 2018-02-23 HISTORY — DX: Type 2 diabetes mellitus without complications: Z79.4

## 2018-02-23 HISTORY — DX: Unspecified osteoarthritis, unspecified site: M19.90

## 2018-02-23 HISTORY — DX: Polyneuropathy, unspecified: G62.9

## 2018-02-23 HISTORY — DX: Monoclonal gammopathy: D47.2

## 2018-02-23 HISTORY — DX: Chronic diastolic (congestive) heart failure: I50.32

## 2018-02-23 HISTORY — DX: Long term (current) use of anticoagulants: Z79.01

## 2018-02-23 HISTORY — DX: Dependence on other enabling machines and devices: Z99.89

## 2018-02-23 HISTORY — DX: Occlusion and stenosis of bilateral carotid arteries: I65.23

## 2018-02-23 HISTORY — DX: Obstructive sleep apnea (adult) (pediatric): G47.33

## 2018-02-23 HISTORY — DX: Anemia, unspecified: D64.9

## 2018-02-23 HISTORY — DX: Paroxysmal atrial fibrillation: I48.0

## 2018-02-23 LAB — BASIC METABOLIC PANEL
ANION GAP: 13 (ref 5–15)
BUN: 44 mg/dL — ABNORMAL HIGH (ref 8–23)
CO2: 27 mmol/L (ref 22–32)
Calcium: 9.1 mg/dL (ref 8.9–10.3)
Chloride: 104 mmol/L (ref 98–111)
Creatinine, Ser: 1.99 mg/dL — ABNORMAL HIGH (ref 0.44–1.00)
GFR calc Af Amer: 27 mL/min — ABNORMAL LOW (ref >60)
GFR calc non Af Amer: 23 mL/min — ABNORMAL LOW (ref >60)
GLUCOSE: 242 mg/dL — AB (ref 70–99)
POTASSIUM: 4.2 mmol/L (ref 3.5–5.1)
Sodium: 144 mmol/L (ref 135–145)

## 2018-02-23 LAB — HEMOGLOBIN A1C
Hgb A1c MFr Bld: 7.1 % — ABNORMAL HIGH (ref 4.8–5.6)
Mean Plasma Glucose: 157.07 mg/dL

## 2018-02-23 LAB — CBC
HEMATOCRIT: 32 % — AB (ref 36.0–46.0)
Hemoglobin: 9.9 g/dL — ABNORMAL LOW (ref 12.0–15.0)
MCH: 28.4 pg (ref 26.0–34.0)
MCHC: 30.9 g/dL (ref 30.0–36.0)
MCV: 92 fL (ref 80.0–100.0)
NRBC: 0 % (ref 0.0–0.2)
Platelets: 266 10*3/uL (ref 150–400)
RBC: 3.48 MIL/uL — AB (ref 3.87–5.11)
RDW: 14.8 % (ref 11.5–15.5)
WBC: 8.6 10*3/uL (ref 4.0–10.5)

## 2018-02-23 LAB — SURGICAL PCR SCREEN
MRSA, PCR: NEGATIVE
STAPHYLOCOCCUS AUREUS: NEGATIVE

## 2018-02-23 LAB — GLUCOSE, CAPILLARY: Glucose-Capillary: 270 mg/dL — ABNORMAL HIGH (ref 70–99)

## 2018-02-23 NOTE — Progress Notes (Addendum)
EKG dated 10-08-2017 in epic. LOV with clearance from cardiologist, dr p. Ross, dated 02-01-2018 in epic.   Requested pt A1c result from 09/ 2019 , dr Osborne Casco office, via phone.  Received via fax a1c dated 01-26-2018 , placed in chart.   Per pt her pcp, dr Osborne Casco is currently adjusting her lantus insulin every day.  Pt verbalized understanding call dr Osborne Casco for instructions for lantus insulin night before surgery since he adjusting dosage daily.

## 2018-03-01 NOTE — Anesthesia Preprocedure Evaluation (Addendum)
Anesthesia Evaluation  Patient identified by MRN, date of birth, ID band Patient awake    Reviewed: Allergy & Precautions, NPO status , Patient's Chart, lab work & pertinent test results, reviewed documented beta blocker date and time   History of Anesthesia Complications (+) PONVNegative for: history of anesthetic complications  Airway Mallampati: II  TM Distance: <3 FB Neck ROM: Full    Dental no notable dental hx.    Pulmonary sleep apnea and Continuous Positive Airway Pressure Ventilation , former smoker,    Pulmonary exam normal        Cardiovascular hypertension, Pt. on medications and Pt. on home beta blockers + Peripheral Vascular Disease (bilateral carotid stenosis)  + dysrhythmias (on amiodarone and chronic anticoagulation- Eliquis) Atrial Fibrillation  Rhythm:Regular Rate:Bradycardia     Neuro/Psych negative neurological ROS  negative psych ROS   GI/Hepatic Neg liver ROS, GERD  Medicated,  Endo/Other  diabetes, Insulin Dependent  Renal/GU Renal InsufficiencyRenal disease (CKD stage III)  negative genitourinary   Musculoskeletal  (+) Arthritis , Osteoarthritis,    Abdominal   Peds  Hematology  (+) anemia ,   Anesthesia Other Findings   Reproductive/Obstetrics                            Anesthesia Physical Anesthesia Plan  ASA: III  Anesthesia Plan: General   Post-op Pain Management: GA combined w/ Regional for post-op pain   Induction: Intravenous  PONV Risk Score and Plan: 4 or greater and Ondansetron, Dexamethasone, Propofol infusion and Treatment may vary due to age or medical condition  Airway Management Planned: LMA  Additional Equipment: None  Intra-op Plan:   Post-operative Plan: Extubation in OR  Informed Consent: I have reviewed the patients History and Physical, chart, labs and discussed the procedure including the risks, benefits and alternatives for the  proposed anesthesia with the patient or authorized representative who has indicated his/her understanding and acceptance.     Plan Discussed with:   Anesthesia Plan Comments:        Anesthesia Quick Evaluation

## 2018-03-02 ENCOUNTER — Inpatient Hospital Stay (HOSPITAL_COMMUNITY)
Admission: RE | Admit: 2018-03-02 | Discharge: 2018-03-04 | DRG: 470 | Disposition: A | Discharge status: Home Health | Payer: Medicare Other | Source: Ambulatory Visit | Attending: Orthopedic Surgery | Admitting: Orthopedic Surgery

## 2018-03-02 ENCOUNTER — Encounter (HOSPITAL_COMMUNITY)
Admission: RE | Disposition: A | Discharge status: Home Health | Payer: Self-pay | Source: Ambulatory Visit | Attending: Orthopedic Surgery

## 2018-03-02 ENCOUNTER — Encounter (HOSPITAL_COMMUNITY): Payer: Self-pay | Admitting: *Deleted

## 2018-03-02 ENCOUNTER — Inpatient Hospital Stay (HOSPITAL_COMMUNITY): Payer: Medicare Other

## 2018-03-02 ENCOUNTER — Inpatient Hospital Stay (HOSPITAL_COMMUNITY): Payer: Medicare Other | Admitting: Anesthesiology

## 2018-03-02 ENCOUNTER — Other Ambulatory Visit: Payer: Self-pay

## 2018-03-02 DIAGNOSIS — I952 Hypotension due to drugs: Secondary | ICD-10-CM | POA: Diagnosis not present

## 2018-03-02 DIAGNOSIS — Z885 Allergy status to narcotic agent status: Secondary | ICD-10-CM

## 2018-03-02 DIAGNOSIS — Z96651 Presence of right artificial knee joint: Secondary | ICD-10-CM | POA: Diagnosis not present

## 2018-03-02 DIAGNOSIS — I5042 Chronic combined systolic (congestive) and diastolic (congestive) heart failure: Secondary | ICD-10-CM | POA: Diagnosis present

## 2018-03-02 DIAGNOSIS — Z803 Family history of malignant neoplasm of breast: Secondary | ICD-10-CM | POA: Diagnosis not present

## 2018-03-02 DIAGNOSIS — E1122 Type 2 diabetes mellitus with diabetic chronic kidney disease: Secondary | ICD-10-CM | POA: Diagnosis present

## 2018-03-02 DIAGNOSIS — Z9049 Acquired absence of other specified parts of digestive tract: Secondary | ICD-10-CM

## 2018-03-02 DIAGNOSIS — Z9841 Cataract extraction status, right eye: Secondary | ICD-10-CM

## 2018-03-02 DIAGNOSIS — R001 Bradycardia, unspecified: Secondary | ICD-10-CM | POA: Diagnosis not present

## 2018-03-02 DIAGNOSIS — E1169 Type 2 diabetes mellitus with other specified complication: Secondary | ICD-10-CM | POA: Diagnosis not present

## 2018-03-02 DIAGNOSIS — M21379 Foot drop, unspecified foot: Secondary | ICD-10-CM | POA: Diagnosis present

## 2018-03-02 DIAGNOSIS — K219 Gastro-esophageal reflux disease without esophagitis: Secondary | ICD-10-CM | POA: Diagnosis present

## 2018-03-02 DIAGNOSIS — E669 Obesity, unspecified: Secondary | ICD-10-CM | POA: Diagnosis present

## 2018-03-02 DIAGNOSIS — E11319 Type 2 diabetes mellitus with unspecified diabetic retinopathy without macular edema: Secondary | ICD-10-CM | POA: Diagnosis present

## 2018-03-02 DIAGNOSIS — H3530 Unspecified macular degeneration: Secondary | ICD-10-CM | POA: Diagnosis present

## 2018-03-02 DIAGNOSIS — I130 Hypertensive heart and chronic kidney disease with heart failure and stage 1 through stage 4 chronic kidney disease, or unspecified chronic kidney disease: Secondary | ICD-10-CM | POA: Diagnosis present

## 2018-03-02 DIAGNOSIS — I480 Paroxysmal atrial fibrillation: Secondary | ICD-10-CM | POA: Diagnosis present

## 2018-03-02 DIAGNOSIS — I4891 Unspecified atrial fibrillation: Secondary | ICD-10-CM | POA: Diagnosis present

## 2018-03-02 DIAGNOSIS — E785 Hyperlipidemia, unspecified: Secondary | ICD-10-CM | POA: Diagnosis present

## 2018-03-02 DIAGNOSIS — Z9842 Cataract extraction status, left eye: Secondary | ICD-10-CM

## 2018-03-02 DIAGNOSIS — Z7901 Long term (current) use of anticoagulants: Secondary | ICD-10-CM

## 2018-03-02 DIAGNOSIS — E1151 Type 2 diabetes mellitus with diabetic peripheral angiopathy without gangrene: Secondary | ICD-10-CM | POA: Diagnosis present

## 2018-03-02 DIAGNOSIS — Z91041 Radiographic dye allergy status: Secondary | ICD-10-CM

## 2018-03-02 DIAGNOSIS — D62 Acute posthemorrhagic anemia: Secondary | ICD-10-CM | POA: Diagnosis not present

## 2018-03-02 DIAGNOSIS — Z87891 Personal history of nicotine dependence: Secondary | ICD-10-CM

## 2018-03-02 DIAGNOSIS — N183 Chronic kidney disease, stage 3 unspecified: Secondary | ICD-10-CM | POA: Diagnosis present

## 2018-03-02 DIAGNOSIS — E1142 Type 2 diabetes mellitus with diabetic polyneuropathy: Secondary | ICD-10-CM | POA: Diagnosis present

## 2018-03-02 DIAGNOSIS — Z888 Allergy status to other drugs, medicaments and biological substances status: Secondary | ICD-10-CM

## 2018-03-02 DIAGNOSIS — Z6831 Body mass index (BMI) 31.0-31.9, adult: Secondary | ICD-10-CM

## 2018-03-02 DIAGNOSIS — Z961 Presence of intraocular lens: Secondary | ICD-10-CM | POA: Diagnosis present

## 2018-03-02 DIAGNOSIS — Z794 Long term (current) use of insulin: Secondary | ICD-10-CM

## 2018-03-02 DIAGNOSIS — G4733 Obstructive sleep apnea (adult) (pediatric): Secondary | ICD-10-CM | POA: Diagnosis present

## 2018-03-02 DIAGNOSIS — Z8249 Family history of ischemic heart disease and other diseases of the circulatory system: Secondary | ICD-10-CM

## 2018-03-02 DIAGNOSIS — Z96659 Presence of unspecified artificial knee joint: Secondary | ICD-10-CM

## 2018-03-02 DIAGNOSIS — D472 Monoclonal gammopathy: Secondary | ICD-10-CM | POA: Diagnosis present

## 2018-03-02 DIAGNOSIS — E538 Deficiency of other specified B group vitamins: Secondary | ICD-10-CM | POA: Diagnosis present

## 2018-03-02 DIAGNOSIS — Z884 Allergy status to anesthetic agent status: Secondary | ICD-10-CM

## 2018-03-02 DIAGNOSIS — M25561 Pain in right knee: Secondary | ICD-10-CM | POA: Diagnosis present

## 2018-03-02 DIAGNOSIS — M1711 Unilateral primary osteoarthritis, right knee: Secondary | ICD-10-CM | POA: Diagnosis present

## 2018-03-02 DIAGNOSIS — N179 Acute kidney failure, unspecified: Secondary | ICD-10-CM | POA: Diagnosis not present

## 2018-03-02 DIAGNOSIS — Z881 Allergy status to other antibiotic agents status: Secondary | ICD-10-CM

## 2018-03-02 HISTORY — DX: Unilateral primary osteoarthritis, right knee: M17.11

## 2018-03-02 HISTORY — PX: TOTAL KNEE ARTHROPLASTY: SHX125

## 2018-03-02 LAB — GLUCOSE, CAPILLARY
GLUCOSE-CAPILLARY: 94 mg/dL (ref 70–99)
GLUCOSE-CAPILLARY: 116 mg/dL — AB (ref 70–99)
GLUCOSE-CAPILLARY: 220 mg/dL — AB (ref 70–99)

## 2018-03-02 SURGERY — ARTHROPLASTY, KNEE, TOTAL
Anesthesia: General | Site: Knee | Laterality: Right

## 2018-03-02 MED ORDER — BISACODYL 10 MG RE SUPP: 10.0000 mg | Freq: Every day | RECTAL | Status: DC | PRN

## 2018-03-02 MED ORDER — HYDROCODONE-ACETAMINOPHEN 7.5-325 MG PO TABS: 1.0000 | ORAL_TABLET | ORAL | Status: DC | PRN

## 2018-03-02 MED ORDER — ONDANSETRON HCL 4 MG PO TABS
4.0000 mg | ORAL_TABLET | Freq: Four times a day (QID) | ORAL | Status: DC | PRN
  Administered 2018-03-02 – 2018-03-04 (×4): 4 mg via ORAL
  Filled 2018-03-02 (×4): qty 1

## 2018-03-02 MED ORDER — OXYCODONE HCL 5 MG/5ML PO SOLN
5.0000 mg | Freq: Once | ORAL | Status: DC | PRN
  Filled 2018-03-02: qty 5

## 2018-03-02 MED ORDER — PROMETHAZINE HCL 25 MG PO TABS: 12.5000 mg | ORAL_TABLET | Freq: Four times a day (QID) | ORAL | Status: DC | PRN

## 2018-03-02 MED ORDER — METOCLOPRAMIDE HCL 5 MG/ML IJ SOLN: 5.0000 mg | Freq: Three times a day (TID) | INTRAMUSCULAR | Status: DC | PRN

## 2018-03-02 MED ORDER — PHENYLEPHRINE HCL 10 MG/ML IJ SOLN
INTRAMUSCULAR | Status: AC
  Filled 2018-03-02: qty 1

## 2018-03-02 MED ORDER — CARBOXYMETHYLCELLULOSE SODIUM 0.5 % OP SOLN: 1.0000 [drp] | Freq: Three times a day (TID) | OPHTHALMIC | Status: DC | PRN

## 2018-03-02 MED ORDER — FENTANYL CITRATE (PF) 100 MCG/2ML IJ SOLN
25.0000 ug | INTRAMUSCULAR | Status: DC | PRN
  Administered 2018-03-02 (×2): 25 ug via INTRAVENOUS

## 2018-03-02 MED ORDER — GLYCOPYRROLATE PF 0.2 MG/ML IJ SOSY
PREFILLED_SYRINGE | INTRAMUSCULAR | Status: DC | PRN
  Administered 2018-03-02 (×2): .2 mg via INTRAVENOUS

## 2018-03-02 MED ORDER — DOCUSATE SODIUM 100 MG PO CAPS
100.0000 mg | ORAL_CAPSULE | Freq: Two times a day (BID) | ORAL | Status: DC
  Administered 2018-03-02 – 2018-03-04 (×4): 100 mg via ORAL
  Filled 2018-03-02 (×4): qty 1

## 2018-03-02 MED ORDER — SODIUM CHLORIDE 0.9 % IR SOLN
Status: DC | PRN
  Administered 2018-03-02: 1000 mL

## 2018-03-02 MED ORDER — ONDANSETRON HCL 4 MG/2ML IJ SOLN: 4.0000 mg | Freq: Four times a day (QID) | INTRAMUSCULAR | Status: DC | PRN

## 2018-03-02 MED ORDER — ACETAMINOPHEN 500 MG PO TABS
500.0000 mg | ORAL_TABLET | Freq: Four times a day (QID) | ORAL | Status: AC
  Administered 2018-03-02 – 2018-03-03 (×4): 500 mg via ORAL
  Filled 2018-03-02 (×4): qty 1

## 2018-03-02 MED ORDER — ONDANSETRON HCL 4 MG/2ML IJ SOLN: 4.0000 mg | Freq: Once | INTRAMUSCULAR | Status: DC | PRN

## 2018-03-02 MED ORDER — MAGNESIUM CITRATE PO SOLN: 1.0000 | Freq: Once | ORAL | Status: DC | PRN

## 2018-03-02 MED ORDER — FUROSEMIDE 20 MG PO TABS: 20.0000 mg | ORAL_TABLET | Freq: Every day | ORAL | Status: DC | PRN

## 2018-03-02 MED ORDER — LACTATED RINGERS IV SOLN
INTRAVENOUS | Status: DC
  Administered 2018-03-02 (×2): via INTRAVENOUS

## 2018-03-02 MED ORDER — PHENYLEPHRINE 40 MCG/ML (10ML) SYRINGE FOR IV PUSH (FOR BLOOD PRESSURE SUPPORT)
PREFILLED_SYRINGE | INTRAVENOUS | Status: AC
  Filled 2018-03-02: qty 10

## 2018-03-02 MED ORDER — DEXAMETHASONE SODIUM PHOSPHATE 10 MG/ML IJ SOLN
INTRAMUSCULAR | Status: DC | PRN
  Administered 2018-03-02: 10 mg via INTRAVENOUS

## 2018-03-02 MED ORDER — CEFAZOLIN SODIUM-DEXTROSE 2-4 GM/100ML-% IV SOLN
2.0000 g | Freq: Four times a day (QID) | INTRAVENOUS | Status: AC
  Administered 2018-03-02 (×2): 2 g via INTRAVENOUS
  Filled 2018-03-02 (×2): qty 100

## 2018-03-02 MED ORDER — ALUM & MAG HYDROXIDE-SIMETH 200-200-20 MG/5ML PO SUSP: 30.0000 mL | ORAL | Status: DC | PRN

## 2018-03-02 MED ORDER — KETOROLAC TROMETHAMINE 30 MG/ML IJ SOLN
INTRAMUSCULAR | Status: DC | PRN
  Administered 2018-03-02: 30 mg

## 2018-03-02 MED ORDER — ZOLPIDEM TARTRATE 5 MG PO TABS: 5.0000 mg | ORAL_TABLET | Freq: Every evening | ORAL | Status: DC | PRN

## 2018-03-02 MED ORDER — METHOCARBAMOL 500 MG IVPB - SIMPLE MED
500.0000 mg | Freq: Four times a day (QID) | INTRAVENOUS | Status: DC | PRN
  Administered 2018-03-02: 500 mg via INTRAVENOUS
  Filled 2018-03-02: qty 50

## 2018-03-02 MED ORDER — PROPOFOL 500 MG/50ML IV EMUL
INTRAVENOUS | Status: DC | PRN
  Administered 2018-03-02: 25 ug/kg/min via INTRAVENOUS

## 2018-03-02 MED ORDER — LINAGLIPTIN 5 MG PO TABS
5.0000 mg | ORAL_TABLET | Freq: Every day | ORAL | Status: DC
  Administered 2018-03-02 – 2018-03-04 (×3): 5 mg via ORAL
  Filled 2018-03-02 (×3): qty 1

## 2018-03-02 MED ORDER — KETOROLAC TROMETHAMINE 30 MG/ML IJ SOLN
INTRAMUSCULAR | Status: AC
  Filled 2018-03-02: qty 1

## 2018-03-02 MED ORDER — ONDANSETRON HCL 4 MG/2ML IJ SOLN
INTRAMUSCULAR | Status: AC
  Filled 2018-03-02: qty 2

## 2018-03-02 MED ORDER — METOPROLOL TARTRATE 25 MG PO TABS: 25.0000 mg | ORAL_TABLET | Freq: Every day | ORAL | 0 refills | Status: DC

## 2018-03-02 MED ORDER — SENNA-DOCUSATE SODIUM 8.6-50 MG PO TABS: 2.0000 | ORAL_TABLET | Freq: Every day | ORAL | 1 refills | Status: AC

## 2018-03-02 MED ORDER — STERILE WATER FOR IRRIGATION IR SOLN
Status: DC | PRN
  Administered 2018-03-02: 2000 mL

## 2018-03-02 MED ORDER — METHOCARBAMOL 500 MG IVPB - SIMPLE MED
INTRAVENOUS | Status: AC
  Administered 2018-03-02: 500 mg via INTRAVENOUS
  Filled 2018-03-02: qty 50

## 2018-03-02 MED ORDER — PANTOPRAZOLE SODIUM 40 MG PO TBEC
80.0000 mg | DELAYED_RELEASE_TABLET | Freq: Every day | ORAL | Status: DC
  Administered 2018-03-02 – 2018-03-04 (×3): 80 mg via ORAL
  Filled 2018-03-02 (×3): qty 2

## 2018-03-02 MED ORDER — HYDROCHLOROTHIAZIDE 25 MG PO TABS: 25.0000 mg | ORAL_TABLET | Freq: Every day | ORAL | Status: DC

## 2018-03-02 MED ORDER — EPHEDRINE 5 MG/ML INJ
INTRAVENOUS | Status: AC
  Filled 2018-03-02: qty 10

## 2018-03-02 MED ORDER — MORPHINE SULFATE (PF) 2 MG/ML IV SOLN: 0.5000 mg | INTRAVENOUS | Status: DC | PRN

## 2018-03-02 MED ORDER — SODIUM CHLORIDE 0.9 % IV SOLN
INTRAVENOUS | Status: DC | PRN
  Administered 2018-03-02: 50 ug/min via INTRAVENOUS

## 2018-03-02 MED ORDER — TRAMADOL HCL 50 MG PO TABS
50.0000 mg | ORAL_TABLET | Freq: Four times a day (QID) | ORAL | Status: DC
  Administered 2018-03-02 – 2018-03-04 (×8): 50 mg via ORAL
  Filled 2018-03-02 (×8): qty 1

## 2018-03-02 MED ORDER — TRAMADOL HCL 50 MG PO TABS: 50.0000 mg | ORAL_TABLET | Freq: Four times a day (QID) | ORAL | 0 refills | Status: DC | PRN

## 2018-03-02 MED ORDER — OXYCODONE HCL 5 MG PO TABS: 5.0000 mg | ORAL_TABLET | Freq: Once | ORAL | Status: DC | PRN

## 2018-03-02 MED ORDER — MENTHOL 3 MG MT LOZG
1.0000 | LOZENGE | OROMUCOSAL | Status: DC | PRN
  Filled 2018-03-02: qty 9

## 2018-03-02 MED ORDER — PROSIGHT PO TABS
1.0000 | ORAL_TABLET | Freq: Every day | ORAL | Status: DC
  Administered 2018-03-03 – 2018-03-04 (×2): 1 via ORAL
  Filled 2018-03-02 (×2): qty 1

## 2018-03-02 MED ORDER — DIPHENHYDRAMINE HCL 12.5 MG/5ML PO ELIX
12.5000 mg | ORAL_SOLUTION | ORAL | Status: DC | PRN
  Administered 2018-03-02: 25 mg via ORAL
  Filled 2018-03-02: qty 10

## 2018-03-02 MED ORDER — PROPOFOL 10 MG/ML IV BOLUS
INTRAVENOUS | Status: DC | PRN
  Administered 2018-03-02: 120 mg via INTRAVENOUS

## 2018-03-02 MED ORDER — ATORVASTATIN CALCIUM 40 MG PO TABS
80.0000 mg | ORAL_TABLET | Freq: Every morning | ORAL | Status: DC
  Administered 2018-03-02 – 2018-03-04 (×3): 80 mg via ORAL
  Filled 2018-03-02 (×3): qty 2

## 2018-03-02 MED ORDER — FENTANYL CITRATE (PF) 100 MCG/2ML IJ SOLN
INTRAMUSCULAR | Status: DC | PRN
  Administered 2018-03-02: 50 ug via INTRAVENOUS
  Administered 2018-03-02: 100 ug via INTRAVENOUS
  Administered 2018-03-02 (×2): 25 ug via INTRAVENOUS

## 2018-03-02 MED ORDER — CEFAZOLIN SODIUM-DEXTROSE 2-4 GM/100ML-% IV SOLN
2.0000 g | INTRAVENOUS | Status: AC
  Administered 2018-03-02: 2 g via INTRAVENOUS
  Filled 2018-03-02: qty 100

## 2018-03-02 MED ORDER — POLYETHYLENE GLYCOL 3350 17 G PO PACK: 17.0000 g | PACK | Freq: Every day | ORAL | Status: DC | PRN

## 2018-03-02 MED ORDER — APIXABAN 5 MG PO TABS
5.0000 mg | ORAL_TABLET | Freq: Two times a day (BID) | ORAL | Status: DC
  Administered 2018-03-03 – 2018-03-04 (×3): 5 mg via ORAL
  Filled 2018-03-02 (×3): qty 1

## 2018-03-02 MED ORDER — ACETAMINOPHEN 325 MG PO TABS: 325.0000 mg | ORAL_TABLET | Freq: Four times a day (QID) | ORAL | Status: DC | PRN

## 2018-03-02 MED ORDER — BUPIVACAINE HCL (PF) 0.25 % IJ SOLN
INTRAMUSCULAR | Status: AC
  Filled 2018-03-02: qty 30

## 2018-03-02 MED ORDER — TRANEXAMIC ACID 1000 MG/10ML IV SOLN
1000.0000 mg | Freq: Once | INTRAVENOUS | Status: AC
  Administered 2018-03-02: 1000 mg via INTRAVENOUS
  Filled 2018-03-02: qty 1000

## 2018-03-02 MED ORDER — MIDAZOLAM HCL 2 MG/2ML IJ SOLN
INTRAMUSCULAR | Status: AC
  Filled 2018-03-02: qty 2

## 2018-03-02 MED ORDER — LIDOCAINE 2% (20 MG/ML) 5 ML SYRINGE
INTRAMUSCULAR | Status: AC
  Filled 2018-03-02: qty 5

## 2018-03-02 MED ORDER — POTASSIUM CHLORIDE CRYS ER 20 MEQ PO TBCR: 20.0000 meq | EXTENDED_RELEASE_TABLET | Freq: Every day | ORAL | Status: DC | PRN

## 2018-03-02 MED ORDER — PROPOFOL 10 MG/ML IV BOLUS
INTRAVENOUS | Status: AC
  Filled 2018-03-02: qty 40

## 2018-03-02 MED ORDER — FENTANYL CITRATE (PF) 250 MCG/5ML IJ SOLN
INTRAMUSCULAR | Status: AC
  Filled 2018-03-02: qty 5

## 2018-03-02 MED ORDER — POTASSIUM CHLORIDE IN NACL 20-0.45 MEQ/L-% IV SOLN
INTRAVENOUS | Status: DC
  Administered 2018-03-02: 12:00:00 via INTRAVENOUS
  Filled 2018-03-02 (×2): qty 1000

## 2018-03-02 MED ORDER — METOCLOPRAMIDE HCL 5 MG PO TABS: 5.0000 mg | ORAL_TABLET | Freq: Three times a day (TID) | ORAL | Status: DC | PRN

## 2018-03-02 MED ORDER — PROMETHAZINE HCL 12.5 MG PO TABS: 12.5000 mg | ORAL_TABLET | Freq: Four times a day (QID) | ORAL | 0 refills | Status: AC | PRN

## 2018-03-02 MED ORDER — ONDANSETRON HCL 4 MG/2ML IJ SOLN
INTRAMUSCULAR | Status: DC | PRN
  Administered 2018-03-02 (×2): 4 mg via INTRAVENOUS

## 2018-03-02 MED ORDER — PROPOFOL 10 MG/ML IV BOLUS
INTRAVENOUS | Status: AC
  Filled 2018-03-02: qty 20

## 2018-03-02 MED ORDER — POLYVINYL ALCOHOL 1.4 % OP SOLN: 1.0000 [drp] | OPHTHALMIC | Status: DC | PRN

## 2018-03-02 MED ORDER — METHOCARBAMOL 500 MG PO TABS: 500.0000 mg | ORAL_TABLET | Freq: Four times a day (QID) | ORAL | Status: DC | PRN

## 2018-03-02 MED ORDER — FENOFIBRATE 160 MG PO TABS
160.0000 mg | ORAL_TABLET | Freq: Every morning | ORAL | Status: DC
  Administered 2018-03-03 – 2018-03-04 (×2): 160 mg via ORAL
  Filled 2018-03-02 (×2): qty 1

## 2018-03-02 MED ORDER — PHENOL 1.4 % MT LIQD: 1.0000 | OROMUCOSAL | Status: DC | PRN

## 2018-03-02 MED ORDER — DEXAMETHASONE SODIUM PHOSPHATE 10 MG/ML IJ SOLN
10.0000 mg | Freq: Once | INTRAMUSCULAR | Status: AC
  Administered 2018-03-03: 10 mg via INTRAVENOUS
  Filled 2018-03-02: qty 1

## 2018-03-02 MED ORDER — FENTANYL CITRATE (PF) 100 MCG/2ML IJ SOLN
INTRAMUSCULAR | Status: AC
  Administered 2018-03-02: 25 ug via INTRAVENOUS
  Filled 2018-03-02: qty 2

## 2018-03-02 MED ORDER — EPHEDRINE SULFATE-NACL 50-0.9 MG/10ML-% IV SOSY
PREFILLED_SYRINGE | INTRAVENOUS | Status: DC | PRN
  Administered 2018-03-02 (×5): 10 mg via INTRAVENOUS

## 2018-03-02 MED ORDER — HYDROCODONE-ACETAMINOPHEN 5-325 MG PO TABS: 1.0000 | ORAL_TABLET | Freq: Four times a day (QID) | ORAL | 0 refills | Status: DC | PRN

## 2018-03-02 MED ORDER — DEXAMETHASONE SODIUM PHOSPHATE 10 MG/ML IJ SOLN
INTRAMUSCULAR | Status: AC
  Filled 2018-03-02: qty 1

## 2018-03-02 MED ORDER — PRESERVISION AREDS 2 PO CAPS: ORAL_CAPSULE | Freq: Two times a day (BID) | ORAL | Status: DC

## 2018-03-02 MED ORDER — LIDOCAINE 2% (20 MG/ML) 5 ML SYRINGE
INTRAMUSCULAR | Status: DC | PRN
  Administered 2018-03-02: 40 mg via INTRAVENOUS

## 2018-03-02 MED ORDER — TRANEXAMIC ACID-NACL 1000-0.7 MG/100ML-% IV SOLN
1000.0000 mg | Freq: Once | INTRAVENOUS | Status: DC
  Filled 2018-03-02: qty 100

## 2018-03-02 MED ORDER — BUPIVACAINE HCL (PF) 0.25 % IJ SOLN
INTRAMUSCULAR | Status: DC | PRN
  Administered 2018-03-02: 20 mL

## 2018-03-02 MED ORDER — ROPIVACAINE HCL 5 MG/ML IJ SOLN
INTRAMUSCULAR | Status: DC | PRN
  Administered 2018-03-02: 30 mL via EPIDURAL

## 2018-03-02 MED ORDER — INSULIN GLARGINE 100 UNIT/ML ~~LOC~~ SOLN
25.0000 [IU] | Freq: Every day | SUBCUTANEOUS | Status: DC
  Administered 2018-03-02: 25 [IU] via SUBCUTANEOUS
  Filled 2018-03-02: qty 0.25

## 2018-03-02 MED ORDER — HYDROCODONE-ACETAMINOPHEN 5-325 MG PO TABS
1.0000 | ORAL_TABLET | ORAL | Status: DC | PRN
  Administered 2018-03-02 (×2): 2 via ORAL
  Administered 2018-03-03: 1 via ORAL
  Administered 2018-03-03 – 2018-03-04 (×2): 2 via ORAL
  Filled 2018-03-02 (×3): qty 2
  Filled 2018-03-02: qty 1
  Filled 2018-03-02: qty 2

## 2018-03-02 SURGICAL SUPPLY — 64 items
BAG SPEC THK2 15X12 ZIP CLS (MISCELLANEOUS)
BAG ZIPLOCK 12X15 (MISCELLANEOUS) IMPLANT
BANDAGE ACE 6X5 VEL STRL LF (GAUZE/BANDAGES/DRESSINGS) ×3 IMPLANT
BEARING TIBIAL PS 10X71/75 (Joint) ×1 IMPLANT
BEARING TIBIAL PS 10X71/75MM (Joint) ×1 IMPLANT
BLADE SAG 18X100X1.27 (BLADE) ×6 IMPLANT
BLADE SURG 15 STRL LF DISP TIS (BLADE) ×1 IMPLANT
BLADE SURG 15 STRL SS (BLADE) ×3
BLADE SURG SZ10 CARB STEEL (BLADE) ×2 IMPLANT
BONE VNGD MED NAIL ×2 IMPLANT
BOWL SMART MIX CTS (DISPOSABLE) ×3 IMPLANT
BRNG TIB 71/75X10 POST VNG (Joint) ×1 IMPLANT
CEMENT BONE R 1X40 (Cement) ×4 IMPLANT
CLOSURE STERI-STRIP 1/2X4 (GAUZE/BANDAGES/DRESSINGS) ×1
CLSR STERI-STRIP ANTIMIC 1/2X4 (GAUZE/BANDAGES/DRESSINGS) ×1 IMPLANT
COMP FEMORAL VANGUARD 65MM (Knees) ×3 IMPLANT
COMPONENT FEMRL VANGUARD 65MM (Knees) IMPLANT
COVER SURGICAL LIGHT HANDLE (MISCELLANEOUS) ×3 IMPLANT
COVER WAND RF STERILE (DRAPES) ×2 IMPLANT
CUFF TOURN SGL QUICK 34 (TOURNIQUET CUFF) ×3
CUFF TRNQT CYL 34X4X40X1 (TOURNIQUET CUFF) ×1 IMPLANT
DECANTER SPIKE VIAL GLASS SM (MISCELLANEOUS) IMPLANT
DISTAL FEMORAL PEG (Knees) ×3 IMPLANT
DRAPE U-SHAPE 47X51 STRL (DRAPES) ×3 IMPLANT
DRSG MEPILEX BORDER 4X8 (GAUZE/BANDAGES/DRESSINGS) ×3 IMPLANT
DURAPREP 26ML APPLICATOR (WOUND CARE) ×6 IMPLANT
ELECT REM PT RETURN 15FT ADLT (MISCELLANEOUS) ×3 IMPLANT
GLOVE BIO SURGEON STRL SZ7.5 (GLOVE) ×3 IMPLANT
GLOVE BIO SURGEON STRL SZ8 (GLOVE) ×3 IMPLANT
GLOVE BIOGEL PI IND STRL 7.0 (GLOVE) IMPLANT
GLOVE BIOGEL PI IND STRL 7.5 (GLOVE) IMPLANT
GLOVE BIOGEL PI IND STRL 8 (GLOVE) ×2 IMPLANT
GLOVE BIOGEL PI INDICATOR 7.0 (GLOVE) ×4
GLOVE BIOGEL PI INDICATOR 7.5 (GLOVE) ×6
GLOVE BIOGEL PI INDICATOR 8 (GLOVE) ×6
GLOVE ECLIPSE 7.5 STRL STRAW (GLOVE) ×2 IMPLANT
GOWN SPEC L4 XLG W/TWL (GOWN DISPOSABLE) ×2 IMPLANT
GOWN SRG XL XLNG 56XLVL 4 (GOWN DISPOSABLE) IMPLANT
GOWN STRL NON-REIN XL XLG LVL4 (GOWN DISPOSABLE) ×6
GOWN STRL REUS W/TWL 2XL LVL3 (GOWN DISPOSABLE) ×3 IMPLANT
GOWN STRL REUS W/TWL LRG LVL3 (GOWN DISPOSABLE) ×3 IMPLANT
HANDPIECE INTERPULSE COAX TIP (DISPOSABLE)
HOLDER FOLEY CATH W/STRAP (MISCELLANEOUS) ×2 IMPLANT
HOOD PEEL AWAY FLYTE STAYCOOL (MISCELLANEOUS) ×9 IMPLANT
IMMOBILIZER KNEE 20 (SOFTGOODS) ×3
IMMOBILIZER KNEE 20 THIGH 36 (SOFTGOODS) IMPLANT
MANIFOLD NEPTUNE II (INSTRUMENTS) ×3 IMPLANT
Medium Bone Nails ×2 IMPLANT
NS IRRIG 1000ML POUR BTL (IV SOLUTION) ×3 IMPLANT
PACK ICE MAXI GEL EZY WRAP (MISCELLANEOUS) ×3 IMPLANT
PACK TOTAL KNEE CUSTOM (KITS) ×3 IMPLANT
PEG FEMORAL DISTAL (Knees) IMPLANT
PEG PATELLA SERIES A 37MMX10MM (Orthopedic Implant) ×2 IMPLANT
PLATE KNEE TIBIAL 71MM FIXED (Plate) ×2 IMPLANT
POSITIONER SURGICAL ARM (MISCELLANEOUS) ×3 IMPLANT
SET HNDPC FAN SPRY TIP SCT (DISPOSABLE) ×1 IMPLANT
SUT VIC AB 0 CT1 36 (SUTURE) ×3 IMPLANT
SUT VIC AB 2-0 CT1 27 (SUTURE) ×3
SUT VIC AB 2-0 CT1 TAPERPNT 27 (SUTURE) ×1 IMPLANT
SUT VIC AB 3-0 SH 8-18 (SUTURE) ×3 IMPLANT
TRAY FOLEY CATH 14FRSI W/METER (CATHETERS) ×2 IMPLANT
TRAY FOLEY MTR SLVR 16FR STAT (SET/KITS/TRAYS/PACK) ×1 IMPLANT
WATER STERILE IRR 1000ML POUR (IV SOLUTION) ×6 IMPLANT
WRAP KNEE MAXI GEL POST OP (GAUZE/BANDAGES/DRESSINGS) ×2 IMPLANT

## 2018-03-02 NOTE — Consult Note (Signed)
[facility] Medical Consultation  Gloria [name] ULA:[id number] DOB: [birth year] DOA: 03/02/2018 PCP: Haywood Pao, MD   Requesting physician: Mardelle Matte Date of consultation: 10/15 Reason for consultation: bradycardia, hypotension  Impression/Recommendations Principal Problem:   Primary localized osteoarthritis of right knee Active Problems:   Diabetes mellitus type 2 in obese [facility])   Atrial fibrillation with rapid ventricular response (HCC)   CKD (chronic kidney disease) stage 3, GFR 30-59 ml/min (HCC)   OSA (obstructive sleep apnea)   Obesity (BMI 30-39.9)   S/P knee replacement   S/P total knee arthroplasty, right    1. Bradycardia  Hypotension:  Hypotension and bradycardia noted with induction of anesthesia.  Hypotension has now resolved, but bradycardia is persistent.  Bradycardia and hypotension thought medication related per review of op note.  She now has persistent bradycardia, but normal BP.  She's asymptomatic on my discussion denying CP, SOB, palitations, LH, or dizziness.  EKG was notable for Julien Oscar junctional rhythm (discussed with telemetry in afternoon who note she's now sinus ~60's).  Will plan to hold metoprolol and amiodarone and continue to follow.  Consider discussing with cardiology if symptomatic, but will need outpatient follow up with cards.  Repeat EKG in AM.    2. R knee OA: s/p right TKA.  Per ortho  3. Hypertension: Will hold HCTZ and lasix given elevated creatinine af few days ago.  Pt also on lasix, would recommend outpatient follow up regarding the use of this concomittantly with thiazide diuretic  4. HLD: statin per ortho, fibrate as well  5. T2DM: lantus at reduced dose ordered per ortho.  Consider holding PO DM meds.  6. Atrial Fibrillation: restart eliquis when ok per surgery.  Holding amiodarone and metoprolol  7. AKI on CKD: creatinine 1.99 on 10/8, baseline appears to be 1.4-1.7, but has been ~2 before in the past.  Will hold HCTZ  and lasix for now, follow repeat with IVF tomorrow.  8. HFpEF: appears euvolemic, will need close follow up with cards given changes to her meds (apparently pt "has Ayuub Penley tendency to go into HF when she goes out of rhythm")  I will followup again tomorrow. Please contact me if I can be of assistance in the meanwhile. Thank you for this consultation.  Chief Complaint: elective TKA  HPI:  77 year old female with Joeseph [place] history of right knee osteoarthritis, diastolic heart failure, type 2 diabetes, atrial fibrillation on Eliquis, MGUS, hypertension and multiple other medical problems presenting for an elective knee replacement.  At the time of my evaluation she notes only right knee pain.  She denies any chest pain, shortness of breath, palpitations, lightheadedness or dizziness.  She states that other than her knee she is feeling well.  She is in the perioperative area and I was consulted for hypotension bradycardia that occurred in the induction of anesthesia..  Review of Systems:  Cecil Vandyke complete review of systems is negative except as noted in the HPI.  Past Medical History:  Diagnosis Date  . Anticoagulant long-term use    eliquis  . B12 deficiency   . Carotid stenosis, bilateral    followed by dr Trula Slade--- per last duplex 08-11-2017  patent right ICA,  left ICA 40-59%,  left CEA >50%  . Chronic anemia   . CKD (chronic kidney disease), stage III ([place])   . Diabetic retinopathy ([place])   . Diastolic CHF, chronic ([place])   . Family history of adverse reaction to anesthesia    daughter - violent  . GERD (gastroesophageal  reflux disease)   . Hyperlipidemia   . Hypertension   . Macular degeneration    Right eye only,  loss of peripheral vision  . MGUS (monoclonal gammopathy of unknown significance)    followed by dr Lindi Adie ([facility])  . OA (osteoarthritis)    knees, fingers, back  . OSA on CPAP 11/05/2017   Severe sleep apnea during REM sleep with oxygen saturations dropping to 78%.  Due  to severe nocturnal hypoxemia as well as significant sleep apnea during REM sleep she is now on CPAP at  11 cm H2O  . PAF (paroxysmal atrial fibrillation) ([place]) 05/2017   primany cardiologist-  dr Dorris Carnes  . Peripheral neuropathy    bilateral feet post back surgery  . Peripheral vascular disease (HCC)    hx rt carotid s/p cea  . PONV (postoperative nausea and vomiting)   . Primary localized osteoarthritis of right knee 03/02/2018  . Type 2 diabetes mellitus treated with insulin ([place])    followed by pcp   Past Surgical History:  Procedure Laterality Date  . CARDIAC CATHETERIZATION  05-27-2005   dr Doreatha Lew   mild to moderate coronary atherosclerosis, 60% ostial narrowing bifurcation OM and 60% D2 w/ minor irregularities otherwise ,  normal LVF  . CARDIOVASCULAR STRESS TEST  02-26-2012    dr Aundra Dubin   normal dobutamin nuclera study w/ no ischemia/  normal LV funciton and wall motion , ef 66%  . CARDIOVERSION N/Amareon Phung 06/07/2017   Procedure: CARDIOVERSION;  Surgeon: Thompson Grayer, MD;  Location: [place];  Service: Cardiovascular;  Laterality: N/Mickie Badders;  . CAROTID ENDARTERECTOMY Right 06-06-2005   dr Amedeo Plenty @[facility]   . CATARACT EXTRACTION W/ INTRAOCULAR LENS  IMPLANT, BILATERAL  2018  . CHOLECYSTECTOMY OPEN  1968   AND APPENDECTOMY  . EYE SURGERY Right 2017    Retinal tear-   . LUMBAR LAMINECTOMY/DECOMPRESSION MICRODISCECTOMY  02/27/2012   Procedure: LUMBAR LAMINECTOMY/DECOMPRESSION MICRODISCECTOMY 2 LEVELS;  Surgeon: Kristeen Miss, MD;  Location: [place] [facility];  Service: Neurosurgery;  Laterality: N/Elka Satterfield;  Lumbar two-three, four-five Laminectomy/foraminotomy  . TONSILLECTOMY  AGE 14  . TRANSTHORACIC ECHOCARDIOGRAM  06-02-2017   dr p. ross   mild LVH,  ef 55%, grade 2 diastoic dysfunction/  trivial MR and TR/  moderate LAE/  mild RAE   Social History:  reports that she quit smoking about 14 years ago. Her smoking use included cigarettes. She has Baylei Siebels 8.00 pack-year smoking history. She has never used smokeless  tobacco. She reports that she does not drink alcohol or use drugs.  Allergies  Allergen Reactions  . Iodinated Diagnostic Agents Anaphylaxis  . Iohexol Hives, Swelling and Other (See Comments)     Desc: ANGIO EDEMA W/ ANAPHYLAXIS PRIOR... HIVES POST 13 HR PREP...OK/Victorino Fatzinger.C.   . Codeine Nausea And Vomiting    SEVERE  . Oxycodone Nausea And Vomiting    SEVERE  . Levaquin [Levofloxacin In D5w] Nausea Only and Other (See Comments)    dizziness  . Propoxyphene N-Acetaminophen Nausea And Vomiting   Family History  Problem Relation Age of Onset  . Heart disease Mother        Heart Disease before age 40  . Heart disease Father   . Heart disease Sister        Heart Disease before age 64  . Breast cancer Daughter 18    Prior to Admission medications   Medication Sig Start Date End Date Taking? Authorizing Provider  amiodarone (PACERONE) 200 MG tablet Take 1 tablet (200 mg  total) by mouth daily. Patient taking differently: Take 200 mg by mouth at bedtime.  08/03/17  Yes Sherran Needs, NP  atorvastatin (LIPITOR) 80 MG tablet Take 80 mg by mouth every morning.    Yes [provider]  carboxymethylcellulose (REFRESH PLUS) 0.5 % SOLN Place 1 drop into both eyes 3 (three) times daily as needed (for dry/irritated eyes.).    Yes [provider]  cyanocobalamin (,VITAMIN B-12,) 1000 MCG/ML injection Inject 1 mL (1,000 mcg total) into the muscle every 30 (thirty) days for 10 doses. 02/15/18 11/13/18 Yes Nicholas Lose, MD  diphenhydrAMINE (BENADRYL) 25 MG tablet Take 25 mg by mouth at bedtime.    Yes [provider]  docusate sodium (STOOL SOFTENER) 100 MG capsule Take 1 capsule (100 mg total) by mouth daily as needed for mild constipation. Patient taking differently: Take 100 mg by mouth every evening.  02/15/18  Yes Nicholas Lose, MD  fenofibrate 160 MG tablet Take 160 mg by mouth every morning.    Yes [provider]  furosemide (LASIX) 40 MG tablet take 1/2 tablet  by mouth daily only as needed for swelling Patient taking differently: Take 20-40 mg by mouth daily as needed for fluid.  10/20/17  Yes Bhagat, Bhavinkumar, PA  hydrochlorothiazide (HYDRODIURIL) 25 MG tablet Take 1 tablet (25 mg total) by mouth daily. Patient taking differently: Take 25 mg by mouth 2 (two) times daily.  02/15/18  Yes Nicholas Lose, MD  JANUVIA 50 MG tablet Take 50 mg by mouth every morning.  05/20/17  Yes [provider]  LANTUS SOLOSTAR 100 UNIT/ML Solostar Pen Inject 25 Units into the skin at bedtime. Per sliding scale 07/21/17  Yes [provider]  lidocaine (LIDODERM) 5 % Place 1 patch onto the skin daily as needed (for pain). Remove & Discard patch within 12 hours or as directed by MD   Yes [provider]  Menthol, Topical Analgesic, (BIOFREEZE EX) Apply 1 application topically 2 (two) times daily as needed (for pain).    Yes [provider]  Multiple Vitamins-Minerals (PRESERVISION AREDS 2 PO) Take 1 capsule by mouth 2 (two) times daily.   Yes [provider]  omeprazole (PRILOSEC) 20 MG capsule Take 20 mg by mouth daily before breakfast.    Yes [provider]  potassium chloride SA (K-DUR,KLOR-CON) 20 MEQ tablet Take 1/2 tablet daily for 1 week then take 1/2 tablet only when you take Lasix Patient taking differently: Take 20 mEq by mouth daily as needed (with Lasix).  10/08/17  Yes Bhagat, Bhavinkumar, PA  ELIQUIS 5 MG TABS tablet TAKE 1 TABLET BY MOUTH TWICE Tracey Hermance DAY 02/22/18   Fay Records, MD  HYDROcodone-acetaminophen [facility]) 5-325 MG tablet Take 1-2 tablets by mouth every 6 (six) hours as needed for moderate pain. MAXIMUM TOTAL ACETAMINOPHEN DOSE IS 4000 MG PER DAY 03/02/18   Marchia Bond, MD  metoprolol tartrate (LOPRESSOR) 25 MG tablet Take 1 tablet (25 mg total) by mouth daily. 03/02/18   Marchia Bond, MD  Needles & Syringes MISC 1 Units by Does not apply route every 30 (thirty) days. Patient not taking: Reported on  02/16/2018 02/15/18   Nicholas Lose, MD  promethazine (PHENERGAN) 12.5 MG tablet Take 1 tablet (12.5 mg total) by mouth every 6 (six) hours as needed for nausea or vomiting. 03/02/18   Marchia Bond, MD  sennosides-docusate sodium (SENOKOT-S) 8.6-50 MG tablet Take 2 tablets by mouth daily. 03/02/18   Marchia Bond, MD  traMADol (ULTRAM) 50 MG  tablet Take 1-2 tablets (50-100 mg total) by mouth every 6 (six) hours as needed for moderate pain. 03/02/18   Marchia Bond, MD   Physical Exam: Blood pressure (!) 147/56, pulse (!) 54, temperature 98.2 F (36.8 C), temperature source Oral, resp. rate 16, height 5\' 4"  (1.626 m), weight 76.2 kg, SpO2 95 %. Vitals:   03/02/18 1200 03/02/18 1541  BP:  (!) 147/56  Pulse:  (!) 54  Resp:  16  Temp: 98.3 F (36.8 C) 98.2 F (36.8 C)  SpO2:  95%     General:  Nad, appears calm and comfortable  Eyes: anicteric sclera, perrl  HEENT: [place]/AT  Neck: suppl  Cardiovascular: bradycardic, no appreciable mrg  Respiratory: ctab  Abdomen: s/nt/nd  Skin: warm and dry  Musculoskeletal: appropriate tone, R knee in brace  Psychiatric: appropriate mood and affect  Neurologic: cn 2-12 intact, moving all extremities (able to wiggle toes to RLE)  Labs on Admission:  Basic Metabolic Panel: No results for input(s): NA, K, CL, CO2, GLUCOSE, BUN, CREATININE, CALCIUM, MG, PHOS in the last 168 hours. Liver Function Tests: No results for input(s): AST, ALT, ALKPHOS, BILITOT, PROT, ALBUMIN in the last 168 hours. No results for input(s): LIPASE, AMYLASE in the last 168 hours. No results for input(s): AMMONIA in the last 168 hours. CBC: No results for input(s): WBC, NEUTROABS, HGB, HCT, MCV, PLT in the last 168 hours. Cardiac Enzymes: No results for input(s): CKTOTAL, CKMB, CKMBINDEX, TROPONINI in the last 168 hours. BNP: Invalid input(s): POCBNP CBG: Recent Labs  Lab 03/02/18 0619 03/02/18 1030  GLUCAP 94 116*    Radiological Exams on Admission: Dg  Knee Right Port  Result Date: 03/02/2018 CLINICAL DATA:  Status post right total knee arthroplasty. EXAM: PORTABLE RIGHT KNEE - 1-2 VIEW COMPARISON:  None. FINDINGS: The patient has undergone right total knee arthroplasty. Radiographic positioning of the prosthetic components is good. The interface with the native bone is normal. There is no acute native bone abnormality. IMPRESSION: There is no immediate postprocedure complication following right total knee arthroplasty. Electronically Signed   By: David  Martinique M.D.   On: 03/02/2018 10:37    EKG: Independently reviewed. Junctional bradycardia.  Compared to priors and similar other than junctional rhythm  Time spent: over 50 min  [facility] Pager [phone]  If 7PM-7AM, please contact night-coverage [email or web address] Password [facility] 03/02/2018, 6:22 PM

## 2018-03-02 NOTE — Anesthesia Procedure Notes (Signed)
Procedure Name: LMA Insertion Date/Time: 03/02/2018 7:43 AM Performed by: Mitzie Na, CRNA Pre-anesthesia Checklist: Patient identified, Emergency Drugs available, Suction available, Patient being monitored and Timeout performed Patient Re-evaluated:Patient Re-evaluated prior to induction Oxygen Delivery Method: Circle system utilized Preoxygenation: Pre-oxygenation with 100% oxygen Induction Type: IV induction LMA: LMA inserted LMA Size: 3.0 Number of attempts: 1 Tube secured with: Tape Dental Injury: Teeth and Oropharynx as per pre-operative assessment

## 2018-03-02 NOTE — H&P (Signed)
PREOPERATIVE H&P  Chief Complaint: right knee pain  HPI: Gloria [name] is a 77 y.o. female who presents for preoperative history and physical with a diagnosis of right knee osteoarthritis. Symptoms are rated as moderate to severe, and have been worsening.  This is significantly impairing activities of daily living.  She has elected for surgical management.   She has failed injections, activity modification, anti-inflammatories, and assistive devices.  Preoperative X-rays demonstrate end stage degenerative changes with osteophyte formation, loss of joint space, subchondral sclerosis.   Past Medical History:  Diagnosis Date  . Anticoagulant long-term use    eliquis  . B12 deficiency   . Carotid stenosis, bilateral    followed by dr Trula Slade--- per last duplex 08-11-2017  patent right ICA,  left ICA 40-59%,  left CEA >50%  . Chronic anemia   . CKD (chronic kidney disease), stage III ([place])   . Diabetic retinopathy ([place])   . Diastolic CHF, chronic ([place])   . Family history of adverse reaction to anesthesia    daughter - violent  . GERD (gastroesophageal reflux disease)   . Hyperlipidemia   . Hypertension   . Macular degeneration    Right eye only,  loss of peripheral vision  . MGUS (monoclonal gammopathy of unknown significance)    followed by dr Lindi Adie ([facility])  . OA (osteoarthritis)    knees, fingers, back  . OSA on CPAP 11/05/2017   Severe sleep apnea during REM sleep with oxygen saturations dropping to 78%.  Due to severe nocturnal hypoxemia as well as significant sleep apnea during REM sleep she is now on CPAP at  11 cm H2O  . PAF (paroxysmal atrial fibrillation) ([place]) 05/2017   primany cardiologist-  dr Dorris Carnes  . Peripheral neuropathy    bilateral feet post back surgery  . Peripheral vascular disease (HCC)    hx rt carotid s/p cea  . PONV (postoperative nausea and vomiting)   . Type 2 diabetes mellitus treated with insulin ([place])    followed by pcp    Past Surgical History:  Procedure Laterality Date  . CARDIAC CATHETERIZATION  05-27-2005   dr Doreatha Lew   mild to moderate coronary atherosclerosis, 60% ostial narrowing bifurcation OM and 60% D2 w/ minor irregularities otherwise ,  normal LVF  . CARDIOVASCULAR STRESS TEST  02-26-2012    dr Aundra Dubin   normal dobutamin nuclera study w/ no ischemia/  normal LV funciton and wall motion , ef 66%  . CARDIOVERSION N/A 06/07/2017   Procedure: CARDIOVERSION;  Surgeon: Thompson Grayer, MD;  Location: [place];  Service: Cardiovascular;  Laterality: N/A;  . CAROTID ENDARTERECTOMY Right 06-06-2005   dr Amedeo Plenty @[facility]   . CATARACT EXTRACTION W/ [place], BILATERAL  2018  . CHOLECYSTECTOMY OPEN  1968   AND APPENDECTOMY  . EYE SURGERY Right 2017    Retinal tear-   . LUMBAR LAMINECTOMY/DECOMPRESSION MICRODISCECTOMY  02/27/2012   Procedure: LUMBAR LAMINECTOMY/DECOMPRESSION MICRODISCECTOMY 2 LEVELS;  Surgeon: Kristeen Miss, MD;  Location: [place] [facility];  Service: Neurosurgery;  Laterality: N/A;  Lumbar two-three, four-five Laminectomy/foraminotomy  . TONSILLECTOMY  AGE 57  . TRANSTHORACIC ECHOCARDIOGRAM  06-02-2017   dr p. ross   mild LVH,  ef 55%, grade 2 diastoic dysfunction/  trivial MR and TR/  moderate LAE/  mild RAE   Social History   Socioeconomic History  . Marital status: Divorced    Spouse name: Not on file  . Number of children: Not on file  . Years  of education: Not on file  . Highest education level: Not on file  Occupational History  . Occupation: Critical care nurse  Social Needs  . Financial resource strain: Not on file  . Food insecurity:    Worry: Not on file    Inability: Not on file  . Transportation needs:    Medical: Not on file    Non-medical: Not on file  Tobacco Use  . Smoking status: Former Smoker    Packs/day: 0.25    Years: 32.00    Pack years: 8.00    Types: Cigarettes    Last attempt to quit: 02/20/2004    Years since quitting: 14.0  . Smokeless tobacco:  Never Used  Substance and Sexual Activity  . Alcohol use: No  . Drug use: Never  . Sexual activity: Yes    Birth control/protection: Post-menopausal  Lifestyle  . Physical activity:    Days per week: Not on file    Minutes per session: Not on file  . Stress: Not on file  Relationships  . Social connections:    Talks on phone: Not on file    Gets together: Not on file    Attends religious service: Not on file    Active member of club or organization: Not on file    Attends meetings of clubs or organizations: Not on file    Relationship status: Not on file  Other Topics Concern  . Not on file  Social History Narrative  . Not on file   Family History  Problem Relation Age of Onset  . Heart disease Mother        Heart Disease before age 64  . Heart disease Father   . Heart disease Sister        Heart Disease before age 30  . Breast cancer Daughter 94   Allergies  Allergen Reactions  . Iodinated Diagnostic Agents Anaphylaxis  . Iohexol Hives, Swelling and Other (See Comments)     Desc: ANGIO EDEMA W/ ANAPHYLAXIS PRIOR... HIVES POST 13 HR PREP...OK/A.C.   . Codeine Nausea And Vomiting    SEVERE  . Oxycodone Nausea And Vomiting    SEVERE  . Levaquin [Levofloxacin In D5w] Nausea Only and Other (See Comments)    dizziness  . Propoxyphene N-Acetaminophen Nausea And Vomiting   Prior to Admission medications   Medication Sig Start Date End Date Taking? Authorizing Provider  amiodarone (PACERONE) 200 MG tablet Take 1 tablet (200 mg total) by mouth daily. Patient taking differently: Take 200 mg by mouth at bedtime.  08/03/17  Yes Sherran Needs, NP  atorvastatin (LIPITOR) 80 MG tablet Take 80 mg by mouth every morning.    Yes [provider]  carboxymethylcellulose (REFRESH PLUS) 0.5 % SOLN Place 1 drop into both eyes 3 (three) times daily as needed (for dry/irritated eyes.).    Yes [provider]  cyanocobalamin (,VITAMIN B-12,) 1000 MCG/ML injection Inject  1 mL (1,000 mcg total) into the muscle every 30 (thirty) days for 10 doses. 02/15/18 11/13/18 Yes Nicholas Lose, MD  diphenhydrAMINE (BENADRYL) 25 MG tablet Take 25 mg by mouth at bedtime.    Yes [provider]  docusate sodium (STOOL SOFTENER) 100 MG capsule Take 1 capsule (100 mg total) by mouth daily as needed for mild constipation. Patient taking differently: Take 100 mg by mouth every evening.  02/15/18  Yes Nicholas Lose, MD  fenofibrate 160 MG tablet Take 160 mg by mouth every morning.    Yes [provider]  furosemide (LASIX) 40 MG tablet take 1/2 tablet by mouth daily only as needed for swelling Patient taking differently: Take 20-40 mg by mouth daily as needed for fluid.  10/20/17  Yes Bhagat, Bhavinkumar, PA  hydrochlorothiazide (HYDRODIURIL) 25 MG tablet Take 1 tablet (25 mg total) by mouth daily. Patient taking differently: Take 25 mg by mouth 2 (two) times daily.  02/15/18  Yes Nicholas Lose, MD  JANUVIA 50 MG tablet Take 50 mg by mouth every morning.  05/20/17  Yes [provider]  LANTUS SOLOSTAR 100 UNIT/ML Solostar Pen Inject 25 Units into the skin at bedtime. Per sliding scale 07/21/17  Yes [provider]  lidocaine (LIDODERM) 5 % Place 1 patch onto the skin daily as needed (for pain). Remove & Discard patch within 12 hours or as directed by MD   Yes [provider]  Menthol, Topical Analgesic, (BIOFREEZE EX) Apply 1 application topically 2 (two) times daily as needed (for pain).    Yes [provider]  metoprolol tartrate (LOPRESSOR) 25 MG tablet Take 1.5 tablets (37.5 mg total) by mouth 2 (two) times daily. Patient taking differently: Take 25 mg by mouth 2 (two) times daily.  10/06/17  Yes Bhagat, Bhavinkumar, PA  Multiple Vitamins-Minerals (PRESERVISION AREDS 2 PO) Take 1 capsule by mouth 2 (two) times daily.   Yes [provider]  omeprazole (PRILOSEC) 20 MG capsule Take 20 mg by mouth daily before breakfast.    Yes  [provider]  potassium chloride SA (K-DUR,KLOR-CON) 20 MEQ tablet Take 1/2 tablet daily for 1 week then take 1/2 tablet only when you take Lasix Patient taking differently: Take 20 mEq by mouth daily as needed (with Lasix).  10/08/17  Yes Bhagat, Bhavinkumar, PA  promethazine (PHENERGAN) 12.5 MG tablet Take 12.5 mg by mouth every 6 (six) hours as needed for nausea or vomiting.   Yes [provider]  traMADol (ULTRAM) 50 MG tablet Take 50-100 mg by mouth every 6 (six) hours as needed for moderate pain.  07/22/17  Yes [provider]  ELIQUIS 5 MG TABS tablet TAKE 1 TABLET BY MOUTH TWICE A DAY 02/22/18   Fay Records, MD  Needles & Syringes MISC 1 Units by Does not apply route every 30 (thirty) days. Patient not taking: Reported on 02/16/2018 02/15/18   Nicholas Lose, MD     Positive ROS: All other systems have been reviewed and were otherwise negative with the exception of those mentioned in the HPI and as above.  Physical Exam: General: Alert, no acute distress Cardiovascular: No pedal edema Respiratory: No cyanosis, no use of accessory musculature GI: No organomegaly, abdomen is soft and non-tender Skin: No lesions in the area of chief complaint Neurologic: Sensation intact distally Psychiatric: Patient is competent for consent with normal mood and affect Lymphatic: No axillary or cervical lymphadenopathy  MUSCULOSKELETAL: right knee rom 0-125 with crepitance and painful arc  Assessment: Right knee oa with history of foot drop atrial fibrillation, angioedema with dependent purpura in the leg   Plan: Plan for Procedure(s): RIGHT TOTAL KNEE ARTHROPLASTY  The risks benefits and alternatives were discussed with the patient including but not limited to the risks of nonoperative treatment, versus surgical intervention including infection, bleeding, nerve injury,  blood clots, cardiopulmonary complications, morbidity, mortality, among others, and they were willing  to proceed.   Anticipated LOS equal to or greater than 2 midnights due to - Age 58 and older with one or more of the following:  -  Obesity  - Expected need for hospital services (PT, OT, Nursing) required for safe  discharge  - Anticipated need for postoperative skilled nursing care or inpatient rehab  - Active co-morbidities: Cardiac Arrhythmia    Johnny Bridge, MD Cell [phone]   03/02/2018 7:21 AM

## 2018-03-02 NOTE — Anesthesia Procedure Notes (Signed)
Anesthesia Regional Block: Adductor canal block   Pre-Anesthetic Checklist: ,, timeout performed, Correct Patient, Correct Site, Correct Laterality, Correct Procedure, Correct Position, site marked, Risks and benefits discussed,  Surgical consent,  Pre-op evaluation,  At surgeon's request and post-op pain management  Laterality: Right  Prep: chloraprep       Needles:  Injection technique: Single-shot  Needle Type: Echogenic Stimulator Needle     Needle Length: 10cm  Needle Gauge: 21     Additional Needles:   Procedures:,,,, ultrasound used (permanent image in chart),,,,  Narrative:  Start time: 03/02/2018 7:18 AM End time: 03/02/2018 7:22 AM Injection made incrementally with aspirations every 5 mL.  Performed by: Personally  Anesthesiologist: Lidia Collum, MD  Additional Notes: Monitors applied. Injection made in 5cc increments. No resistance to injection. Good needle visualization. Patient tolerated procedure well.

## 2018-03-02 NOTE — Evaluation (Signed)
Physical Therapy Evaluation Patient Details Name: Gloria [name] MRN: [id number] DOB: 1940/08/06 Today's Date: 03/02/2018   History of Present Illness  77 YO female s/p R TKR on 03/02/18. PMH includes bilateral carotid stenosis, CKDIII, DMII, HLD, HTN, macular degeneration R eye, OSA, afib, PVD. Surgical history includes cardiac caths, cardioversion, cataracts extraction, lumbar laminectomy/discectomy.   Clinical Impression   Pt presents with R knee pain, difficulty performing mobility tasks, decreased R knee ROM, and decreased tolerance for ambulation. Pt to benefit from acute PT to address deficits. Pt ambulated hallway distance with min guard assist with RW. Pt's EKG monitored during session, HR ranging from 56-73. EKG registered "multiform PVCs"; Pt suspecting artifact noise on EKG leads given pt was asymptomatic during PT session and gait belt was on. PT to continue to follow pt acutely, and will progress mobility as able.     Follow Up Recommendations Follow surgeon's recommendation for DC plan and follow-up therapies;Supervision for mobility/OOB    Equipment Recommendations  None recommended by PT    Recommendations for Other Services       Precautions / Restrictions Precautions Precautions: Fall Restrictions Weight Bearing Restrictions: No RLE Weight Bearing: Weight bearing as tolerated      Mobility  Bed Mobility Overal bed mobility: Needs Assistance Bed Mobility: Supine to Sit     Supine to sit: Min assist;HOB elevated     General bed mobility comments: Min assist for RLE management. Verbal cuing for sequencing to EOB. HR in supine in 50s, HR sitting EOB 56-65 bpm.  Transfers Overall transfer level: Needs assistance Equipment used: Rolling walker (2 wheeled) Transfers: Sit to/from Stand Sit to Stand: Min guard;From elevated surface         General transfer comment: Min guard for safety, verbal cuing for hand placement on RW.    Ambulation/Gait Ambulation/Gait assistance: Min guard Gait Distance (Feet): 45 Feet Assistive device: Rolling walker (2 wheeled) Gait Pattern/deviations: Step-to pattern;Decreased stride length;Step-through pattern;Decreased weight shift to right;Antalgic Gait velocity: decr    General Gait Details: min guard for safety. Verbal cuing for sequencing, position in RW. During ambulation, EKG stating "multiform PVCs", suspected artifact due to gait belt position. Pt asymptomatic. HR 60-73 bpm during ambulation.   Stairs            Wheelchair Mobility    Modified Rankin (Stroke Patients Only)       Balance Overall balance assessment: Mild deficits observed, not formally tested                                           Pertinent Vitals/Pain Pain Assessment: 0-10 Pain Score: 4  Pain Location: R knee  Pain Descriptors / Indicators: Aching Pain Intervention(s): Limited activity within patient's tolerance;Repositioned;Ice applied;Monitored during session;Premedicated before session    Home Living Family/patient expects to be discharged to:: Private residence Living Arrangements: Children Available Help at Discharge: Family;Available 24 hours/day Type of Home: House Home Access: Level entry     Home Layout: Two level;Able to live on main level with bedroom/bathroom Home Equipment: Shower seat - built in;Grab bars - tub/shower;Hand held shower head;Adaptive equipment;Bedside commode;Walker - 2 wheels;Wheelchair - manual;Cane - single point      Prior Function Level of Independence: Independent with assistive device(s)         Comments: Uses cane for ambulation. Does all ADLs, except heavy cleaning.      Hand  Dominance   Dominant Hand: Right    Extremity/Trunk Assessment   Upper Extremity Assessment Upper Extremity Assessment: Overall WFL for tasks assessed    Lower Extremity Assessment Lower Extremity Assessment: Overall WFL for tasks  assessed;RLE deficits/detail RLE Deficits / Details: Suspected post-surgical weakness; able to perform SLR x3, quad sets, ankle pumps  RLE Sensation: WNL    Cervical / Trunk Assessment Cervical / Trunk Assessment: Normal  Communication   Communication: No difficulties  Cognition Arousal/Alertness: Awake/alert Behavior During Therapy: WFL for tasks assessed/performed Overall Cognitive Status: Within Functional Limits for tasks assessed                                        General Comments      Exercises Total Joint Exercises Ankle Circles/Pumps: AROM;Both;5 reps;Seated Knee Flexion: AAROM;Right;5 reps;Supine Goniometric ROM: knee aarom approximately 5-45*, limited by pain and stiffness   Assessment/Plan    PT Assessment Patient needs continued PT services  PT Problem List Decreased strength;Pain;Decreased range of motion;Decreased activity tolerance;Decreased knowledge of use of DME;Decreased mobility;Decreased balance       PT Treatment Interventions DME instruction;Therapeutic activities;Gait training;Therapeutic exercise;Patient/family education;Balance training;Functional mobility training    PT Goals (Current goals can be found in the Care Plan section)  Acute Rehab PT Goals Patient Stated Goal: none stated  PT Goal Formulation: With patient Time For Goal Achievement: 03/16/18 Potential to Achieve Goals: Good    Frequency 7X/week   Barriers to discharge        Co-evaluation               AM-PAC PT "6 Clicks" Daily Activity  Outcome Measure Difficulty turning over in bed (including adjusting bedclothes, sheets and blankets)?: Unable Difficulty moving from lying on back to sitting on the side of the bed? : Unable Difficulty sitting down on and standing up from a chair with arms (e.g., wheelchair, bedside commode, etc,.)?: Unable Help needed moving to and from a bed to chair (including a wheelchair)?: A Little Help needed walking in  hospital room?: A Little Help needed climbing 3-5 steps with a railing? : A Little 6 Click Score: 12    End of Session Equipment Utilized During Treatment: Gait belt;Oxygen(Pt on 3LO2 via [place]; not worn during PT but applied after session. ) Activity Tolerance: Patient tolerated treatment well Patient left: with call bell/phone within reach;with family/visitor present;in chair;with SCD's reapplied(Pt and pt's daughter verbally agree pt will press call button and have assist before mobilizing. ) Nurse Communication: Mobility status PT Visit Diagnosis: Difficulty in walking, not elsewhere classified (R26.2);Other abnormalities of gait and mobility (R26.89)    Time: 6712-4580 PT Time Calculation (min) (ACUTE ONLY): 35 min   Charges:   PT Evaluation $PT Eval Low Complexity: 1 Low PT Treatments $Gait Training: 8-22 mins        Julien Girt, PT Acute Rehabilitation Services Pager [phone]  Office [phone]   Kahmari Herard D Elonda Husky 03/02/2018, 8:26 PM

## 2018-03-02 NOTE — Care Management Note (Signed)
Case Management Note  Patient Details  Name: Gloria [name] MRN: [id number] Date of Birth: 1941-05-19  Subjective/Objective: Admitted w/R TKA. From home. Has rw,3n1. Noted on 02-will monitor.PT cons-await recc.                   Action/Plan:dc home w/HHC.   Expected Discharge Date:                  Expected Discharge Plan:  [place]  In-House Referral:     Discharge planning Services  CM Consult  Post Acute Care Choice:  Durable Medical Equipment(rw,3n1) Choice offered to:     DME Arranged:    DME Agency:     HH Arranged:    HH Agency:     Status of Service:  In process, will continue to follow  If discussed at Long Length of Stay Meetings, dates discussed:    Additional Comments:  Dessa Phi, RN 03/02/2018, 3:18 PM

## 2018-03-02 NOTE — Anesthesia Postprocedure Evaluation (Signed)
Anesthesia Post Note  Patient: Gloria [name]  Procedure(s) Performed: RIGHT TOTAL KNEE ARTHROPLASTY (Right Knee)     Patient location during evaluation: PACU Anesthesia Type: General Level of consciousness: awake and alert Pain management: pain level controlled Vital Signs Assessment: post-procedure vital signs reviewed and stable Respiratory status: spontaneous breathing, nonlabored ventilation, respiratory function stable and patient connected to nasal cannula oxygen Cardiovascular status: blood pressure returned to baseline and stable Postop Assessment: no apparent nausea or vomiting Anesthetic complications: no    Last Vitals:  Vitals:   03/02/18 1115 03/02/18 1130  BP: (!) 129/48 107/80  Pulse: (!) 44 (!) 48  Resp: 20 12  Temp:    SpO2: 93% 93%    Last Pain:  Vitals:   03/02/18 1130  TempSrc:   PainSc: 6                  Lidia Collum

## 2018-03-02 NOTE — Transfer of Care (Signed)
Immediate Anesthesia Transfer of Care Note  Patient: Gloria [name]  Procedure(s) Performed: RIGHT TOTAL KNEE ARTHROPLASTY (Right Knee)  Patient Location: PACU  Anesthesia Type:General  Level of Consciousness: awake, oriented, drowsy and patient cooperative  Airway & Oxygen Therapy: Patient Spontanous Breathing and Patient connected to face mask oxygen  Post-op Assessment: Report given to RN, Post -op Vital signs reviewed and stable and Patient moving all extremities  Post vital signs: Reviewed and stable  Last Vitals:  Vitals Value Taken Time  BP 114/77 03/02/2018 10:04 AM  Temp    Pulse 43 03/02/2018 10:11 AM  Resp 12 03/02/2018 10:11 AM  SpO2 86 % 03/02/2018 10:11 AM  Vitals shown include unvalidated device data.  Last Pain:  Vitals:   03/02/18 0612  TempSrc: Oral      Patients Stated Pain Goal: 4 (03/55/97 4163)  Complications: No apparent anesthesia complications

## 2018-03-02 NOTE — Discharge Instructions (Signed)

## 2018-03-02 NOTE — Progress Notes (Signed)
Pt's home CPAP machine inspected for damages/defects, none were found.  Pt's machine is working properly at this time, RT to monitor and assess as needed.

## 2018-03-02 NOTE — Op Note (Signed)
DATE OF SURGERY:  03/02/2018 TIME: 9:55 AM  PATIENT NAME:  Gloria [name]   AGE: 77 y.o.    PRE-OPERATIVE DIAGNOSIS: Right knee primary localized osteoarthritis  POST-OPERATIVE DIAGNOSIS:  Same  PROCEDURE: RIGHT total Knee Arthroplasty  SURGEON:  Johnny Bridge, MD   ASSISTANT:  Joya Gaskins, OPA-C, present and scrubbed throughout the case, critical for assistance with exposure, retraction, instrumentation, and closure.   OPERATIVE IMPLANTS: [employer]omet Vanguard Fixed Bearing Posterior Stabilized Femur size 65, Tibia size 71, Patella size 37 3-peg oval button, with a 10 mm polyethylene insert.   PREOPERATIVE INDICATIONS:  Gloria [name] is a 77 y.o. year old female with end stage bone on bone degenerative arthritis of the knee who failed conservative treatment, including injections, antiinflammatories, activity modification, and assistive devices, and had significant impairment of their activities of daily living, and elected for Total Knee Arthroplasty.   The risks, benefits, and alternatives were discussed at length including but not limited to the risks of infection, bleeding, nerve injury, stiffness, blood clots, the need for revision surgery, cardiopulmonary complications, among others, and they were willing to proceed.  OPERATIVE FINDINGS AND UNIQUE ASPECTS OF THE CASE: There was substantial grade 4 chondral loss on the patellofemoral joint with eburnation of the patella.  The lateral compartment had diffuse grade 3 degenerative changes in the medial compartment had extensive grade 4 chondral loss.  Bone quality was mediocre.  She was fairly flexible however and had excellent motion both before and after the procedure.  ESTIMATED BLOOD LOSS: 150 mL  OPERATIVE DESCRIPTION:  The patient was brought to the operative room and placed in a supine position.  General anesthesia was administered.  IV antibiotics were given.  During induction she developed significant hypotension,  as well as bradycardia, much of which was attributed to medications.  We debated abandoning the case but she is stabilized her hemodynamic status, and we were capable of proceeding.    The lower extremity was prepped and draped in the usual sterile fashion.  Time out was performed.  The leg was elevated and exsanguinated and the tourniquet was inflated.  Anterior quadriceps tendon splitting approach was performed.  The patella was everted and osteophytes were removed.  The anterior horn of the medial and lateral meniscus was removed.   The distal femur was opened with the drill and the intramedullary distal femoral cutting jig was utilized, set at 5 degrees resecting 9 mm off the distal femur.  Care was taken to protect the collateral ligaments.  Then the extramedullary tibial cutting jig was utilized making the appropriate cut using the anterior tibial crest as a reference building in appropriate posterior slope.  Care was taken during the cut to protect the medial and collateral ligaments.  The proximal tibia was removed along with the posterior horns of the menisci.  The PCL was sacrificed.    The extensor gap was measured and was approximately 66mm.    The distal femoral sizing jig was applied, taking care to avoid notching.  Then the 4-in-1 cutting jig was applied and the anterior and posterior femur was cut, along with the chamfer cuts.  All posterior osteophytes were removed.  The flexion gap was then measured and was symmetric with the extension gap.  I completed the distal femoral preparation using the appropriate jig to prepare the box.  The patella was then measured, and cut with the saw.  The thickness before the cut was 22 and after the cut was 13.  The  proximal tibia sized and prepared accordingly with the reamer and the punch, and then all components were trialed with the 108mm poly insert.  The knee was found to have excellent balance and full motion.    The above named components  were then cemented into place and all excess cement was removed.  The real polyethylene implant was placed.  After the cement had cured I released the tourniquet and confirmed excellent hemostasis with no major posterior vessel injury.    The knee was easily taken through a range of motion and the patella tracked well and the knee irrigated copiously and the parapatellar and subcutaneous tissue closed with vicryl, and monocryl with steri strips for the skin.  The wounds were injected with marcaine, and dressed with sterile gauze and the patient was awakened and returned to the PACU in stable and satisfactory condition.  There were no complications.  Total tourniquet time was 70 minutes.

## 2018-03-03 ENCOUNTER — Encounter (HOSPITAL_COMMUNITY): Payer: Self-pay | Admitting: Orthopedic Surgery

## 2018-03-03 DIAGNOSIS — R001 Bradycardia, unspecified: Secondary | ICD-10-CM

## 2018-03-03 DIAGNOSIS — E669 Obesity, unspecified: Secondary | ICD-10-CM

## 2018-03-03 DIAGNOSIS — N183 Chronic kidney disease, stage 3 (moderate): Secondary | ICD-10-CM

## 2018-03-03 DIAGNOSIS — N179 Acute kidney failure, unspecified: Secondary | ICD-10-CM

## 2018-03-03 DIAGNOSIS — G4733 Obstructive sleep apnea (adult) (pediatric): Secondary | ICD-10-CM

## 2018-03-03 DIAGNOSIS — I952 Hypotension due to drugs: Secondary | ICD-10-CM

## 2018-03-03 DIAGNOSIS — Z96651 Presence of right artificial knee joint: Secondary | ICD-10-CM

## 2018-03-03 DIAGNOSIS — M1711 Unilateral primary osteoarthritis, right knee: Principal | ICD-10-CM

## 2018-03-03 DIAGNOSIS — E1169 Type 2 diabetes mellitus with other specified complication: Secondary | ICD-10-CM

## 2018-03-03 LAB — RETICULOCYTES
Immature Retic Fract: 20 % — ABNORMAL HIGH (ref 2.3–15.9)
RBC.: 2.93 MIL/uL — ABNORMAL LOW (ref 3.87–5.11)
RETIC COUNT ABSOLUTE: 78.2 10*3/uL (ref 19.0–186.0)
Retic Ct Pct: 2.7 % (ref 0.4–3.1)

## 2018-03-03 LAB — CBC
HCT: 27.5 % — ABNORMAL LOW (ref 36.0–46.0)
HEMOGLOBIN: 8.1 g/dL — AB (ref 12.0–15.0)
MCH: 27.6 pg (ref 26.0–34.0)
MCHC: 29.5 g/dL — AB (ref 30.0–36.0)
MCV: 93.5 fL (ref 80.0–100.0)
NRBC: 0 % (ref 0.0–0.2)
Platelets: 234 10*3/uL (ref 150–400)
RBC: 2.94 MIL/uL — ABNORMAL LOW (ref 3.87–5.11)
RDW: 15 % (ref 11.5–15.5)
WBC: 10.1 10*3/uL (ref 4.0–10.5)

## 2018-03-03 LAB — FERRITIN: Ferritin: 937 ng/mL — ABNORMAL HIGH (ref 11–307)

## 2018-03-03 LAB — BASIC METABOLIC PANEL
ANION GAP: 13 (ref 5–15)
BUN: 49 mg/dL — ABNORMAL HIGH (ref 8–23)
CALCIUM: 8.6 mg/dL — AB (ref 8.9–10.3)
CHLORIDE: 104 mmol/L (ref 98–111)
CO2: 22 mmol/L (ref 22–32)
CREATININE: 2.38 mg/dL — AB (ref 0.44–1.00)
GFR calc non Af Amer: 19 mL/min — ABNORMAL LOW (ref >60)
GFR, EST AFRICAN AMERICAN: 22 mL/min — AB (ref >60)
Glucose, Bld: 220 mg/dL — ABNORMAL HIGH (ref 70–99)
Potassium: 5 mmol/L (ref 3.5–5.1)
SODIUM: 139 mmol/L (ref 135–145)

## 2018-03-03 LAB — IRON AND TIBC
IRON: 43 ug/dL (ref 28–170)
Saturation Ratios: 12 % (ref 10.4–31.8)
TIBC: 352 ug/dL (ref 250–450)
UIBC: 309 ug/dL

## 2018-03-03 MED ORDER — INSULIN GLARGINE 100 UNIT/ML ~~LOC~~ SOLN
45.0000 [IU] | Freq: Every day | SUBCUTANEOUS | Status: DC
  Administered 2018-03-03: 45 [IU] via SUBCUTANEOUS
  Filled 2018-03-03: qty 0.45

## 2018-03-03 MED ORDER — INSULIN ASPART 100 UNIT/ML ~~LOC~~ SOLN: 0.0000 [IU] | Freq: Three times a day (TID) | SUBCUTANEOUS | Status: DC

## 2018-03-03 MED ORDER — SODIUM CHLORIDE 0.9 % IV SOLN
INTRAVENOUS | Status: AC
  Administered 2018-03-03: 10:00:00 via INTRAVENOUS

## 2018-03-03 MED ORDER — INSULIN GLARGINE 100 UNIT/ML ~~LOC~~ SOLN
20.0000 [IU] | Freq: Once | SUBCUTANEOUS | Status: AC
  Administered 2018-03-03: 20 [IU] via SUBCUTANEOUS
  Filled 2018-03-03: qty 0.2

## 2018-03-03 NOTE — Progress Notes (Signed)
Spoke with patient over the phone, overall doing well.  Still bradycardic but no symptoms.  Appreciate Dr. Buford Dresser input on complex medical issues.    Plan DC home tomorrow if creatinine close to baseline and medically ok and passes PT.   Anticipated LOS equal to or greater than 2 midnights due to - Age 78 and older with one or more of the following:  - Obesity  - Expected need for hospital services (PT, OT, Nursing) required for safe  discharge  - Anticipated need for postoperative skilled nursing care or inpatient rehab  - Active co-morbidities: Diabetes, Cardiac Arrhythmia and Anemia OR   - Unanticipated findings during/Post Surgery: Slow post-op progression: GI, pain control, mobility  - Patient is a high risk of re-admission due to: multiple previous medically related admissions in the last year

## 2018-03-03 NOTE — Progress Notes (Addendum)
Patient ID: Gloria [name], female   DOB: [birth year], 77 y.o.   MRN: [id number]     Subjective:  Patient reports pain as mild.  Patient denies CP or SOB  Objective:   VITALS:   Vitals:   03/03/18 0603 03/03/18 0903 03/03/18 1129 03/03/18 1403  BP: (!) 131/57 (!) 123/33  (!) 108/43  Pulse: (!) 50 (!) 48  (!) 44  Resp: 16 18  16   Temp: 98 F (36.7 C) 97.8 F (36.6 C)  98 F (36.7 C)  TempSrc: Oral Oral  Oral  SpO2: 95% 100% 96% 98%  Weight: 83.8 kg     Height:        Wound clean and dry Sensation intact to toes Flexion/ext good Dressing intact  Lab Results  Component Value Date   WBC 10.1 03/03/2018   HGB 8.1 (L) 03/03/2018   HCT 27.5 (L) 03/03/2018   MCV 93.5 03/03/2018   PLT 234 03/03/2018   BMET    Component Value Date/Time   NA 139 03/03/2018 0552   NA 145 (H) 10/08/2017 1555   K 5.0 03/03/2018 0552   CL 104 03/03/2018 0552   CO2 22 03/03/2018 0552   GLUCOSE 220 (H) 03/03/2018 0552   BUN 49 (H) 03/03/2018 0552   BUN 25 10/08/2017 1555   CREATININE 2.38 (H) 03/03/2018 0552   CREATININE 1.60 (H) 02/15/2018 0857   CALCIUM 8.6 (L) 03/03/2018 0552   GFRNONAA 19 (L) 03/03/2018 0552   GFRNONAA 30 (L) 02/15/2018 0857   GFRAA 22 (L) 03/03/2018 0552   GFRAA 35 (L) 02/15/2018 0857     Assessment/Plan: 1 Day Post-Op   Principal Problem:   Primary localized osteoarthritis of right knee Active Problems:   Diabetes mellitus type 2 in obese (HCC)   Atrial fibrillation with rapid ventricular response (HCC)   CKD (chronic kidney disease) stage 3, GFR 30-59 ml/min (HCC)   OSA (obstructive sleep apnea)   Obesity (BMI 30-39.9)   S/P knee replacement   S/P total knee arthroplasty, right   Continue to monitor the BP Patient to take home dose of lantis wbat Dry dressing prn Plan for Pearl, BRANDON 03/03/2018, 7:35 PM  Discussed with patient and family.  Agree with above.   Marchia Bond, MD Cell [phone]

## 2018-03-03 NOTE — Care Management Note (Signed)
Case Management Note  Patient Details  Name: Gloria [name] MRN: [id number] Date of Birth: 09-21-1940  Subjective/Objective: POD1 R TKA. From home w/support,has rw,3n1. Per Renee(office liason)already set up with [name] aware of HHPT. No further CM needs.                   Action/Plan:dc home w/HHC.   Expected Discharge Date:                  Expected Discharge Plan:  [place]  In-House Referral:     Discharge planning Services  CM Consult  Post Acute Care Choice:  Durable Medical Equipment(rw,3n1) Choice offered to:     DME Arranged:    DME Agency:     HH Arranged:  PT HH Agency:  [facility] (formerly [employer])  Status of Service:  In process, will continue to follow  If discussed at Long Length of Stay Meetings, dates discussed:    Additional Comments:  Dessa Phi, RN 03/03/2018, 2:53 PM

## 2018-03-03 NOTE — Progress Notes (Addendum)
Inpatient Diabetes Program Recommendations  AACE/ADA: New Consensus Statement on Inpatient Glycemic Control (2015)  Target Ranges:  Prepandial:   less than 140 mg/dL      Peak postprandial:   less than 180 mg/dL (1-2 hours)      Critically ill patients:  140 - 180 mg/dL   Results for [name], Gloria C (MRN [id number]) as of 03/03/2018 11:06  Ref. Range 03/02/2018 06:19 03/02/2018 10:30 03/02/2018 21:02  Glucose-Capillary Latest Ref Range: 70 - 99 mg/dL 94 116 (H) 220 (H)  25 units LANTUS    Admit with: R Total Knee  History: DM, CKD  Home DM Meds: Januvia 50 mg Daily       Lantus 25 units QHS  Current Orders: Lantus 45 units QHS      Tradjenta 5 mg Daily     MD- Please consider adding Novolog Sensitive Correction Scale/ SSI (0-9 units) TID AC + HS    Patient received 10 mg Decadron yesterday at 8am.  Received another 10 mg Decadron at 10am today.  Patient got 20 units Lantus last PM at bedtime and another 20 units Lantus this AM.  Scheduled to receive 45 units Lantus tonight.  Met with pt today to review home DM meds.  Per pt, Dr. Tisovec (PCP) recently decreased Januvia dose to 50 mg Daily, Stopped Metformin, and had her increase/titrate her Lantus upward.  Current Lantus dose at home is 45 units QHS.  Discussed with patient that she received 2 doses of Decadron that are likely causing her glucose elevations.  Patient aware that her dose of Lantus has been increased for tonight.  Asked RN caring for pt to please contact Ortho MD to get order for CBG checks and possibly Novolog SSI.    --Will follow patient during hospitalization--  Jeannine Johnston Fishel RN, MSN, CDE Diabetes Coordinator Inpatient Glycemic Control Team Team Pager: [phone] (8a-5p)  

## 2018-03-03 NOTE — Progress Notes (Signed)
Physical Therapy Treatment Patient Details Name: Gloria [name] MRN: [id number] DOB: Jul 09, 1940 Today's Date: 03/03/2018    History of Present Illness 77 YO female s/p R TKR on 03/02/18. PMH includes bilateral carotid stenosis, CKDIII, DMII, HLD, HTN, macular degeneration R eye, OSA, afib, PVD. Surgical history includes cardiac caths, cardioversion, cataracts extraction, lumbar laminectomy/discectomy.     PT Comments    Pt ambulated in hallway and HR 60 bpm during ambulation.  Pt reports fatigue from being up in recliner this morning so assisted back to bed.   Follow Up Recommendations  Follow surgeon's recommendation for DC plan and follow-up therapies;Supervision for mobility/OOB     Equipment Recommendations  None recommended by PT    Recommendations for Other Services       Precautions / Restrictions Precautions Precautions: Fall;Knee Restrictions RLE Weight Bearing: Weight bearing as tolerated    Mobility  Bed Mobility Overal bed mobility: Needs Assistance Bed Mobility: Supine to Sit;Sit to Supine     Supine to sit: Min guard Sit to supine: Min guard   General bed mobility comments: verbal cues for self assist; HR 50 bpm at rest  Transfers Overall transfer level: Needs assistance Equipment used: Rolling walker (2 wheeled) Transfers: Sit to/from Stand Sit to Stand: Min guard         General transfer comment: verbal cues for UE and LE positioning  Ambulation/Gait Ambulation/Gait assistance: Min guard Gait Distance (Feet): 80 Feet Assistive device: Rolling walker (2 wheeled) Gait Pattern/deviations: Step-to pattern;Decreased stance time - right;Antalgic Gait velocity: decr    General Gait Details: verbal cues for sequence, RW positioning, step length, HR 60 bpm during ambulation   Stairs             Wheelchair Mobility    Modified Rankin (Stroke Patients Only)       Balance                                             Cognition Arousal/Alertness: Awake/alert Behavior During Therapy: WFL for tasks assessed/performed Overall Cognitive Status: Within Functional Limits for tasks assessed                                        Exercises      General Comments        Pertinent Vitals/Pain Pain Assessment: 0-10 Pain Score: 5  Pain Location: R knee  Pain Descriptors / Indicators: Aching;Sore Pain Intervention(s): Limited activity within patient's tolerance;Monitored during session;Repositioned    Home Living                      Prior Function            PT Goals (current goals can now be found in the care plan section) Progress towards PT goals: Progressing toward goals    Frequency           PT Plan Current plan remains appropriate    Co-evaluation              AM-PAC PT "6 Clicks" Daily Activity  Outcome Measure  Difficulty turning over in bed (including adjusting bedclothes, sheets and blankets)?: A Lot Difficulty moving from lying on back to sitting on the side of the bed? : A Lot Difficulty sitting down on  and standing up from a chair with arms (e.g., wheelchair, bedside commode, etc,.)?: A Lot Help needed moving to and from a bed to chair (including a wheelchair)?: A Little Help needed walking in hospital room?: A Little Help needed climbing 3-5 steps with a railing? : A Little 6 Click Score: 15    End of Session Equipment Utilized During Treatment: Gait belt Activity Tolerance: Patient tolerated treatment well Patient left: in bed;with call bell/phone within reach;with family/visitor present;with bed alarm set   PT Visit Diagnosis: Difficulty in walking, not elsewhere classified (R26.2);Other abnormalities of gait and mobility (R26.89)     Time: 4235-3614 PT Time Calculation (min) (ACUTE ONLY): 11 min  Charges:  $Gait Training: 8-22 mins                    Gloria [name], PT, DPT Acute Rehabilitation Services Office:  [phone] Pager: [phone]  Gloria [name] 03/03/2018, 2:09 PM

## 2018-03-03 NOTE — Progress Notes (Addendum)
PROGRESS NOTE    Gloria [name]   UDJ:[id number]  DOB: Apr 28, 1941  DOA: 03/02/2018 PCP: Haywood Pao, MD   Brief Narrative:  Gloria [name] 77 year old female with a history of right knee osteoarthritis, diastolic heart failure, type 2 diabetes, atrial fibrillation on Eliquis, MGUS, hypertension and multiple other medical problems presenting for an elective knee replacement.  [facility] were consulted for hypotension bradycardia that occurred in the induction of anesthesia..   Subjective: No complaints.  ROS: no complaints of nausea, vomiting, constipation diarrhea, cough, dyspnea or dysuria. No other complaints.   Assessment & Plan:   Principal Problem:   Primary localized osteoarthritis of right knee - s/p TKA- management per primary team  Active Problems: Junctional rhythm/ Bradycardia/ Hypotension/  Atrial fibrillation   - she was in Junctional rhythm initially and is now sinus brady - HR continues to be low in 40-50s range - BP has improved after holding Metoprolol, HCTZ and Lasix - last ECHO (1/19) showed normal systolic function and grade 2 d CHF - have reviewed prior notes and discussed above with her primary cardiologist, Dr Harrington Challenger who recommends to continue to hold medications and will plan on close f/u in the office-  - Eliquis for A-fib resumed  Increasing Creatinine/ AKI/ CKD 3 - renal function has declined andBUN/ Cr has risen from 27/1.60 to 49/2.38 - I have added slow IVF and will continue to hold diuretics  Anemia - Hb 10.0 has dropped to 8.1- follow- obtain anemia panel in AM    Diabetes mellitus type 2 in obese  - cont Lantus and SSI- Lantus will be increased today as AM sugar is elevated     OSA (obstructive sleep apnea) - cont CPAP    Obesity (BMI 30-39.9) - Body mass index is 31.71 kg/m.     Antimicrobials:  Anti-infectives (From admission, onward)   Start     Dose/Rate Route Frequency Ordered Stop   03/02/18 1400   ceFAZolin (ANCEF) IVPB 2g/100 mL premix     2 g 200 mL/hr over 30 Minutes Intravenous Every 6 hours 03/02/18 1159 03/02/18 2212   03/02/18 0600  ceFAZolin (ANCEF) IVPB 2g/100 mL premix     2 g 200 mL/hr over 30 Minutes Intravenous On call to O.R. 03/02/18 0543 03/02/18 0737       Objective: Vitals:   03/02/18 2103 03/03/18 0603 03/03/18 0903 03/03/18 1129  BP: (!) 102/51 (!) 131/57 (!) 123/33   Pulse: (!) 47 (!) 50 (!) 48   Resp: 18 16 18    Temp: 98.5 F (36.9 C) 98 F (36.7 C) 97.8 F (36.6 C)   TempSrc: Oral Oral Oral   SpO2: 93% 95% 100% 96%  Weight:  83.8 kg    Height:        Intake/Output Summary (Last 24 hours) at 03/03/2018 1308 Last data filed at 03/03/2018 0700 Gross per 24 hour  Intake 378.19 ml  Output 750 ml  Net -371.81 ml   Filed Weights   03/02/18 0619 03/03/18 0603  Weight: 76.2 kg 83.8 kg    Examination: General exam: Appears comfortable  HEENT: PERRLA, oral mucosa moist, no sclera icterus or thrush Respiratory system: Clear to auscultation. Respiratory effort normal. Cardiovascular system: S1 & S2 heard, RRR.   Gastrointestinal system: Abdomen soft, non-tender, nondistended. Normal bowel sound. No organomegaly Central nervous system: Alert and oriented. No focal neurological deficits. Extremities: No cyanosis, clubbing or edema-   Skin: No rashes or ulcers Psychiatry:  Mood & affect appropriate.  Data Reviewed: I have personally reviewed following labs and imaging studies  CBC: Recent Labs  Lab 03/03/18 0552  WBC 10.1  HGB 8.1*  HCT 27.5*  MCV 93.5  PLT 335   Basic Metabolic Panel: Recent Labs  Lab 03/03/18 0552  NA 139  K 5.0  CL 104  CO2 22  GLUCOSE 220*  BUN 49*  CREATININE 2.38*  CALCIUM 8.6*   GFR: Estimated Creatinine Clearance: 20.7 mL/min (A) (by C-G formula based on SCr of 2.38 mg/dL (H)). Liver Function Tests: No results for input(s): AST, ALT, ALKPHOS, BILITOT, PROT, ALBUMIN in the last 168 hours. No  results for input(s): LIPASE, AMYLASE in the last 168 hours. No results for input(s): AMMONIA in the last 168 hours. Coagulation Profile: No results for input(s): INR, PROTIME in the last 168 hours. Cardiac Enzymes: No results for input(s): CKTOTAL, CKMB, CKMBINDEX, TROPONINI in the last 168 hours. BNP (last 3 results) No results for input(s): PROBNP in the last 8760 hours. HbA1C: No results for input(s): HGBA1C in the last 72 hours. CBG: Recent Labs  Lab 03/02/18 0619 03/02/18 1030 03/02/18 2102  GLUCAP 94 116* 220*   Lipid Profile: No results for input(s): CHOL, HDL, LDLCALC, TRIG, CHOLHDL, LDLDIRECT in the last 72 hours. Thyroid Function Tests: No results for input(s): TSH, T4TOTAL, FREET4, T3FREE, THYROIDAB in the last 72 hours. Anemia Panel: No results for input(s): VITAMINB12, FOLATE, FERRITIN, TIBC, IRON, RETICCTPCT in the last 72 hours. Urine analysis: No results found for: COLORURINE, APPEARANCEUR, LABSPEC, PHURINE, GLUCOSEU, HGBUR, BILIRUBINUR, KETONESUR, PROTEINUR, UROBILINOGEN, NITRITE, LEUKOCYTESUR Sepsis Labs: @LABRCNTIP (procalcitonin:4,lacticidven:4) ) Recent Results (from the past 240 hour(s))  Surgical pcr screen     Status: None   Collection Time: 02/23/18 10:35 AM  Result Value Ref Range Status   MRSA, PCR NEGATIVE NEGATIVE Final   Staphylococcus aureus NEGATIVE NEGATIVE Final    Comment: (NOTE) The Xpert SA Assay (FDA approved for NASAL specimens in patients 104 years of age and older), is one component of a comprehensive surveillance program. It is not intended to diagnose infection nor to guide or monitor treatment. Performed at [facility] [place] [address] [place], [place] [address]          Radiology Studies: Dg Knee Right Port  Result Date: 03/02/2018 CLINICAL DATA:  Status post right total knee arthroplasty. EXAM: PORTABLE RIGHT KNEE - 1-2 VIEW COMPARISON:  None. FINDINGS: The patient has undergone right total knee  arthroplasty. Radiographic positioning of the prosthetic components is good. The interface with the native bone is normal. There is no acute native bone abnormality. IMPRESSION: There is no immediate postprocedure complication following right total knee arthroplasty. Electronically Signed   By: David  Martinique M.D.   On: 03/02/2018 10:37      Scheduled Meds: . acetaminophen  500 mg Oral Q6H  . apixaban  5 mg Oral BID  . atorvastatin  80 mg Oral q morning - 10a  . docusate sodium  100 mg Oral BID  . fenofibrate  160 mg Oral q morning - 10a  . insulin aspart  0-15 Units Subcutaneous TID WC  . insulin glargine  45 Units Subcutaneous QHS  . linagliptin  5 mg Oral Daily  . multivitamin  1 tablet Oral Daily  . pantoprazole  80 mg Oral Daily  . traMADol  50 mg Oral Q6H   Continuous Infusions: . sodium chloride 50 mL/hr at 03/03/18 0956  . methocarbamol (ROBAXIN) IV 500 mg (03/02/18 1115)     LOS: 1  day    Time spent in minutes: 35    Debbe Odea, MD [facility] Pager: [email or web address] Password [email or web address] 03/03/2018, 1:08 PM

## 2018-03-03 NOTE — Progress Notes (Signed)
Physical Therapy Treatment Patient Details Name: Gloria [name] MRN: [id number] DOB: 05/18/1941 Today's Date: 03/03/2018    History of Present Illness 77 YO female s/p R TKR on 03/02/18. PMH includes bilateral carotid stenosis, CKDIII, DMII, HLD, HTN, macular degeneration R eye, OSA, afib, PVD. Surgical history includes cardiac caths, cardioversion, cataracts extraction, lumbar laminectomy/discectomy.     PT Comments    Pt reports she already performed her exercises x10 each on R LE prior to arrival.  Pt demonstrated heel slides, SLR, ankle pumps, and quad sets.  Added SAQ and hip abduction which pt performed.  Pt then ambulated in hallway and HR presents the same as this morning's session: 50 bpm at rest and increases to 60 bpm with ambulation.  Pt denies any symptoms during mobility (other then slight increase in knee pain as expected).  Pt progressing very well with mobility and anticipate d/c home tomorrow.    Follow Up Recommendations  Follow surgeon's recommendation for DC plan and follow-up therapies;Supervision for mobility/OOB     Equipment Recommendations  None recommended by PT    Recommendations for Other Services       Precautions / Restrictions Precautions Precautions: Fall;Knee Restrictions RLE Weight Bearing: Weight bearing as tolerated    Mobility  Bed Mobility Overal bed mobility: Needs Assistance Bed Mobility: Supine to Sit     Supine to sit: Supervision Sit to supine: Min guard   General bed mobility comments: HR 50 bpm at rest  Transfers Overall transfer level: Needs assistance Equipment used: Rolling walker (2 wheeled) Transfers: Sit to/from Stand Sit to Stand: Min guard;Supervision         General transfer comment: verbal cues for UE and LE positioning  Ambulation/Gait Ambulation/Gait assistance: Min guard;Supervision Gait Distance (Feet): 220 Feet Assistive device: Rolling walker (2 wheeled) Gait Pattern/deviations: Step-to  pattern;Decreased stance time - right;Antalgic;Step-through pattern Gait velocity: decr    General Gait Details: verbal cues for sequence, RW positioning, step length, HR 60 bpm during ambulation   Stairs             Wheelchair Mobility    Modified Rankin (Stroke Patients Only)       Balance                                            Cognition Arousal/Alertness: Awake/alert Behavior During Therapy: WFL for tasks assessed/performed Overall Cognitive Status: Within Functional Limits for tasks assessed                                        Exercises Total Joint Exercises Short Arc Quad: AROM;10 reps;Right Hip ABduction/ADduction: AROM;10 reps;Right Goniometric ROM: AAROM R knee approx 5-80* AROM supine    General Comments        Pertinent Vitals/Pain Pain Assessment: 0-10 Pain Score: 4  Pain Location: R lateral knee  Pain Descriptors / Indicators: Sore;Discomfort Pain Intervention(s): Limited activity within patient's tolerance;Repositioned;Monitored during session;Ice applied    Home Living                      Prior Function            PT Goals (current goals can now be found in the care plan section) Progress towards PT goals: Progressing toward goals  Frequency    7X/week      PT Plan Current plan remains appropriate    Co-evaluation              AM-PAC PT "6 Clicks" Daily Activity  Outcome Measure  Difficulty turning over in bed (including adjusting bedclothes, sheets and blankets)?: A Little Difficulty moving from lying on back to sitting on the side of the bed? : A Little Difficulty sitting down on and standing up from a chair with arms (e.g., wheelchair, bedside commode, etc,.)?: A Little Help needed moving to and from a bed to chair (including a wheelchair)?: A Little Help needed walking in hospital room?: A Little Help needed climbing 3-5 steps with a railing? : A Little 6 Click  Score: 18    End of Session Equipment Utilized During Treatment: Gait belt Activity Tolerance: Patient tolerated treatment well Patient left: with call bell/phone within reach;with family/visitor present;in chair   PT Visit Diagnosis: Difficulty in walking, not elsewhere classified (R26.2);Other abnormalities of gait and mobility (R26.89)     Time: 0511-0211 PT Time Calculation (min) (ACUTE ONLY): 13 min  Charges:  $Gait Training: 8-22 mins                    Carmelia Bake, PT, DPT Acute Rehabilitation Services Office: [phone] Pager: [phone]  Trena Platt 03/03/2018, 4:27 PM

## 2018-03-04 ENCOUNTER — Telehealth: Payer: Self-pay | Admitting: Internal Medicine

## 2018-03-04 LAB — BASIC METABOLIC PANEL
Anion gap: 11 (ref 5–15)
BUN: 50 mg/dL — ABNORMAL HIGH (ref 8–23)
CHLORIDE: 107 mmol/L (ref 98–111)
CO2: 20 mmol/L — AB (ref 22–32)
CREATININE: 2.15 mg/dL — AB (ref 0.44–1.00)
Calcium: 8.6 mg/dL — ABNORMAL LOW (ref 8.9–10.3)
GFR calc Af Amer: 24 mL/min — ABNORMAL LOW (ref >60)
GFR calc non Af Amer: 21 mL/min — ABNORMAL LOW (ref >60)
Glucose, Bld: 209 mg/dL — ABNORMAL HIGH (ref 70–99)
Potassium: 4.9 mmol/L (ref 3.5–5.1)
SODIUM: 138 mmol/L (ref 135–145)

## 2018-03-04 LAB — CBC
HEMATOCRIT: 28.1 % — AB (ref 36.0–46.0)
HEMOGLOBIN: 7.9 g/dL — AB (ref 12.0–15.0)
MCH: 27.4 pg (ref 26.0–34.0)
MCHC: 28.1 g/dL — AB (ref 30.0–36.0)
MCV: 97.6 fL (ref 80.0–100.0)
Platelets: 228 10*3/uL (ref 150–400)
RBC: 2.88 MIL/uL — ABNORMAL LOW (ref 3.87–5.11)
RDW: 15 % (ref 11.5–15.5)
WBC: 8.3 10*3/uL (ref 4.0–10.5)
nRBC: 0.4 % — ABNORMAL HIGH (ref 0.0–0.2)

## 2018-03-04 LAB — VITAMIN B12: Vitamin B-12: 1116 pg/mL — ABNORMAL HIGH (ref 180–914)

## 2018-03-04 LAB — GLUCOSE, CAPILLARY: Glucose-Capillary: 166 mg/dL — ABNORMAL HIGH (ref 70–99)

## 2018-03-04 LAB — FOLATE: Folate: 6.9 ng/mL (ref >5.9)

## 2018-03-04 MED ORDER — METOPROLOL TARTRATE 25 MG PO TABS: 25.0000 mg | ORAL_TABLET | Freq: Every day | ORAL | 0 refills | Status: DC

## 2018-03-04 NOTE — Telephone Encounter (Signed)
New Message   Pt c/o medication issue:  1. Name of Medication: metoprolol tartrate (LOPRESSOR) 25 MG tablet  2. How are you currently taking this medication (dosage and times per day)?Take 1 tablet (25 mg total) by mouth daily  3. Are you having a reaction (difficulty breathing--STAT)? no  4. What is your medication issue? Pt's daughter is calling states that the pt was just released from the hospital and was told to not take her amiodarone and the daughter wants to know what she should fo when he has to use it

## 2018-03-04 NOTE — Telephone Encounter (Signed)
Left message for patient's daughter to call back tomorrow. DC instructions indicate amiodarone was stopped. EKG yesterday pt was in afib.  Will route to Dr. Harrington Challenger for input.

## 2018-03-04 NOTE — Discharge Summary (Addendum)
Physician Discharge Summary  Patient ID: Gloria [name] MRN: [id number] DOB/AGE: [birth year] 78 y.o.  Admit date: 03/02/2018 Discharge date: 03/04/2018  Admission Diagnoses:  Primary localized osteoarthritis of right knee  Discharge Diagnoses:  Principal Problem:   Primary localized osteoarthritis of right knee Active Problems:   Diabetes mellitus type 2 in obese [facility])   Atrial fibrillation with rapid ventricular response (HCC)   CKD (chronic kidney disease) stage 3, GFR 30-59 ml/min (HCC)   OSA (obstructive sleep apnea)   Obesity (BMI 30-39.9)   S/P knee replacement   S/P total knee arthroplasty, right   Past Medical History:  Diagnosis Date  . Anticoagulant long-term use    eliquis  . B12 deficiency   . Carotid stenosis, bilateral    followed by dr Trula Slade--- per last duplex 08-11-2017  patent right ICA,  left ICA 40-59%,  left CEA >50%  . Chronic anemia   . CKD (chronic kidney disease), stage III ([place])   . Diabetic retinopathy ([place])   . Diastolic CHF, chronic ([place])   . Family history of adverse reaction to anesthesia    daughter - violent  . GERD (gastroesophageal reflux disease)   . Hyperlipidemia   . Hypertension   . Macular degeneration    Right eye only,  loss of peripheral vision  . MGUS (monoclonal gammopathy of unknown significance)    followed by dr Lindi Adie ([facility])  . OA (osteoarthritis)    knees, fingers, back  . OSA on CPAP 11/05/2017   Severe sleep apnea during REM sleep with oxygen saturations dropping to 78%.  Due to severe nocturnal hypoxemia as well as significant sleep apnea during REM sleep she is now on CPAP at  11 cm H2O  . PAF (paroxysmal atrial fibrillation) ([place]) 05/2017   primany cardiologist-  dr Dorris Carnes  . Peripheral neuropathy    bilateral feet post back surgery  . Peripheral vascular disease (HCC)    hx rt carotid s/p cea  . PONV (postoperative nausea and vomiting)   . Primary localized osteoarthritis of right knee  03/02/2018  . Type 2 diabetes mellitus treated with insulin ([place])    followed by pcp    Surgeries: Procedure(s): RIGHT TOTAL KNEE ARTHROPLASTY on 03/02/2018   Consultants (if any):   Discharged Condition: Improved  Hospital Course: Gloria [name] is an 77 y.o. female who was admitted 03/02/2018 with a diagnosis of Primary localized osteoarthritis of right knee and went to the operating room on 03/02/2018 and underwent the above named procedures.    She was given perioperative antibiotics:  Anti-infectives (From admission, onward)   Start     Dose/Rate Route Frequency Ordered Stop   03/02/18 1400  ceFAZolin (ANCEF) IVPB 2g/100 mL premix     2 g 200 mL/hr over 30 Minutes Intravenous Every 6 hours 03/02/18 1159 03/02/18 2212   03/02/18 0600  ceFAZolin (ANCEF) IVPB 2g/100 mL premix     2 g 200 mL/hr over 30 Minutes Intravenous On call to O.R. 03/02/18 3220 03/02/18 0737    .  She was given sequential compression devices, early ambulation, and eliquis for DVT prophylaxis.  She had postop and intraop HR in 40s, and hypotension in 60s-70s, BP meds held, BP went back to 160s on POD 2 and meds restarted at discharge with close followup with her cardiologist.  Appreciated inpatient Medical Consultation and guidance.  She benefited maximally from the hospital stay and there were no complications.    Recent vital signs:  Vitals:  03/03/18 2127 03/04/18 0631  BP: (!) 148/54 (!) 164/73  Pulse: (!) 58 62  Resp: 18 20  Temp: 98.2 F (36.8 C) 97.9 F (36.6 C)  SpO2: 93% 97%    Recent laboratory studies:  Lab Results  Component Value Date   HGB 7.9 (L) 03/04/2018   HGB 8.1 (L) 03/03/2018   HGB 9.9 (L) 02/23/2018   Lab Results  Component Value Date   WBC 8.3 03/04/2018   PLT 228 03/04/2018   Lab Results  Component Value Date   INR 1.10 09/03/2017   Lab Results  Component Value Date   NA 138 03/04/2018   K 4.9 03/04/2018   CL 107 03/04/2018   CO2 20 (L) 03/04/2018    BUN 50 (H) 03/04/2018   CREATININE 2.15 (H) 03/04/2018   GLUCOSE 209 (H) 03/04/2018    Discharge Medications:   Allergies as of 03/04/2018      Reactions   Iodinated Diagnostic Agents Anaphylaxis   Iohexol Hives, Swelling, Other (See Comments)    Desc: ANGIO EDEMA W/ ANAPHYLAXIS PRIOR... HIVES POST 13 HR PREP...OK/A.C.   Codeine Nausea And Vomiting   SEVERE   Oxycodone Nausea And Vomiting   SEVERE   Levaquin [levofloxacin In D5w] Nausea Only, Other (See Comments)   dizziness   Propoxyphene N-acetaminophen Nausea And Vomiting      Medication List    STOP taking these medications   amiodarone 200 MG tablet Commonly known as:  PACERONE     TAKE these medications   atorvastatin 80 MG tablet Commonly known as:  LIPITOR Take 80 mg by mouth every morning.   BIOFREEZE EX Apply 1 application topically 2 (two) times daily as needed (for pain).   carboxymethylcellulose 0.5 % Soln Commonly known as:  REFRESH PLUS Place 1 drop into both eyes 3 (three) times daily as needed (for dry/irritated eyes.).   cyanocobalamin 1000 MCG/ML injection Commonly known as:  (VITAMIN B-12) Inject 1 mL (1,000 mcg total) into the muscle every 30 (thirty) days for 10 doses.   diphenhydrAMINE 25 MG tablet Commonly known as:  BENADRYL Take 25 mg by mouth at bedtime.   docusate sodium 100 MG capsule Commonly known as:  COLACE Take 1 capsule (100 mg total) by mouth daily as needed for mild constipation. What changed:  when to take this   ELIQUIS 5 MG Tabs tablet Generic drug:  apixaban TAKE 1 TABLET BY MOUTH TWICE A DAY   fenofibrate 160 MG tablet Take 160 mg by mouth every morning.   furosemide 40 MG tablet Commonly known as:  LASIX take 1/2 tablet by mouth daily only as needed for swelling What changed:    how much to take  how to take this  when to take this  reasons to take this  additional instructions   hydrochlorothiazide 25 MG tablet Commonly known as:  HYDRODIURIL Take  1 tablet (25 mg total) by mouth daily. What changed:  when to take this   HYDROcodone-acetaminophen 5-325 MG tablet Commonly known as:  NORCO/VICODIN Take 1-2 tablets by mouth every 6 (six) hours as needed for moderate pain. MAXIMUM TOTAL ACETAMINOPHEN DOSE IS 4000 MG PER DAY   JANUVIA 50 MG tablet Generic drug:  sitaGLIPtin Take 50 mg by mouth every morning.   LANTUS SOLOSTAR 100 UNIT/ML Solostar Pen Generic drug:  Insulin Glargine Inject 45 Units into the skin at bedtime. Per sliding scale   lidocaine 5 % Commonly known as:  LIDODERM Place 1 patch onto the skin daily  as needed (for pain). Remove & Discard patch within 12 hours or as directed by MD   metoprolol tartrate 25 MG tablet Commonly known as:  LOPRESSOR Take 1 tablet (25 mg total) by mouth daily. What changed:    how much to take  when to take this   Needles & Syringes Misc 1 Units by Does not apply route every 30 (thirty) days.   omeprazole 20 MG capsule Commonly known as:  PRILOSEC Take 20 mg by mouth daily before breakfast.   potassium chloride SA 20 MEQ tablet Commonly known as:  K-DUR,KLOR-CON Take 1/2 tablet daily for 1 week then take 1/2 tablet only when you take Lasix What changed:    how much to take  how to take this  when to take this  reasons to take this  additional instructions   PRESERVISION AREDS 2 PO Take 1 capsule by mouth 2 (two) times daily.   promethazine 12.5 MG tablet Commonly known as:  PHENERGAN Take 1 tablet (12.5 mg total) by mouth every 6 (six) hours as needed for nausea or vomiting.   sennosides-docusate sodium 8.6-50 MG tablet Commonly known as:  SENOKOT-S Take 2 tablets by mouth daily.   traMADol 50 MG tablet Commonly known as:  ULTRAM Take 1-2 tablets (50-100 mg total) by mouth every 6 (six) hours as needed for moderate pain.       Diagnostic Studies: Dg Knee Right Port  Result Date: 03/02/2018 CLINICAL DATA:  Status post right total knee arthroplasty.  EXAM: PORTABLE RIGHT KNEE - 1-2 VIEW COMPARISON:  None. FINDINGS: The patient has undergone right total knee arthroplasty. Radiographic positioning of the prosthetic components is good. The interface with the native bone is normal. There is no acute native bone abnormality. IMPRESSION: There is no immediate postprocedure complication following right total knee arthroplasty. Electronically Signed   By: David  Martinique M.D.   On: 03/02/2018 10:37    Disposition: Discharge disposition: 01-Home or Self Care         Follow-up Information    Marchia Bond, MD. Schedule an appointment as soon as possible for a visit in 2 weeks.   Specialty:  Orthopedic Surgery Contact information: [address] [place] [address] [phone]        [facility] Follow up.   Specialty:  [place] Why:  [facility] physical therapy Contact information: [address] [place] [address] [phone]            Signed: Johnny Bridge 03/04/2018, 10:53 AM

## 2018-03-04 NOTE — Progress Notes (Addendum)
Patient ID: Gloria [name], female   DOB: 11-13-1940, 77 y.o.   MRN: [id number]     Subjective:  Patient reports pain as mild.  Patient in bed and in no acute distress  Objective:   VITALS:   Vitals:   03/03/18 1129 03/03/18 1403 03/03/18 2127 03/04/18 0631  BP:  (!) 108/43 (!) 148/54 (!) 164/73  Pulse:  (!) 44 (!) 58 62  Resp:  16 18 20   Temp:  98 F (36.7 C) 98.2 F (36.8 C) 97.9 F (36.6 C)  TempSrc:  Oral Oral Oral  SpO2: 96% 98% 93% 97%  Weight:      Height:        ABD soft Sensation intact distally Dorsiflexion/Plantar flexion intact Incision: dressing C/D/I and scant drainage   Lab Results  Component Value Date   WBC 8.3 03/04/2018   HGB 7.9 (L) 03/04/2018   HCT 28.1 (L) 03/04/2018   MCV 97.6 03/04/2018   PLT 228 03/04/2018   BMET    Component Value Date/Time   NA 138 03/04/2018 0540   NA 145 (H) 10/08/2017 1555   K 4.9 03/04/2018 0540   CL 107 03/04/2018 0540   CO2 20 (L) 03/04/2018 0540   GLUCOSE 209 (H) 03/04/2018 0540   BUN 50 (H) 03/04/2018 0540   BUN 25 10/08/2017 1555   CREATININE 2.15 (H) 03/04/2018 0540   CREATININE 1.60 (H) 02/15/2018 0857   CALCIUM 8.6 (L) 03/04/2018 0540   GFRNONAA 21 (L) 03/04/2018 0540   GFRNONAA 30 (L) 02/15/2018 0857   GFRAA 24 (L) 03/04/2018 0540   GFRAA 35 (L) 02/15/2018 0857     Assessment/Plan: 2 Days Post-Op   Principal Problem:   Primary localized osteoarthritis of right knee Active Problems:   Diabetes mellitus type 2 in obese (HCC)   Atrial fibrillation with rapid ventricular response (HCC)   CKD (chronic kidney disease) stage 3, GFR 30-59 ml/min (HCC)   OSA (obstructive sleep apnea)   Obesity (BMI 30-39.9)   S/P knee replacement   S/P total knee arthroplasty, right   Advance diet Up with therapy Plan for DC home after morning PT session  WBAT Dry dressing PRN Follow up with Dr Mardelle Matte as scheduled      Rande Brunt, [facility] 03/04/2018, 7:16 AM  ABLA looks stable,  asymptomatic, observe Acute on chronic kidney injury, improved with IVF Agree with above.   Marchia Bond, MD Cell [phone]

## 2018-03-04 NOTE — Progress Notes (Signed)
Physical Therapy Treatment Patient Details Name: Gloria [name] MRN: [id number] DOB: 10/17/1940 Today's Date: 03/04/2018    History of Present Illness 77 YO female s/p R TKR on 03/02/18. PMH includes bilateral carotid stenosis, CKDIII, DMII, HLD, HTN, macular degeneration R eye, OSA, afib, PVD. Surgical history includes cardiac caths, cardioversion, cataracts extraction, lumbar laminectomy/discectomy.     PT Comments    Pt ambulated in hallway and presenting at supervision level.  Pt reports performing exercises already this morning so verbally reviewed HEP program and provided HEP handout.  Pt to d/c home today with assist from daughter.    Follow Up Recommendations  Follow surgeon's recommendation for DC plan and follow-up therapies;Supervision for mobility/OOB     Equipment Recommendations  None recommended by PT    Recommendations for Other Services       Precautions / Restrictions Precautions Precautions: Fall;Knee Restrictions RLE Weight Bearing: Weight bearing as tolerated    Mobility  Bed Mobility Overal bed mobility: Modified Independent             General bed mobility comments: HR 67 bpm at rest  Transfers Overall transfer level: Needs assistance Equipment used: Rolling walker (2 wheeled) Transfers: Sit to/from Stand Sit to Stand: Supervision         General transfer comment: able to perform with safe technique  Ambulation/Gait Ambulation/Gait assistance: Supervision;Min guard Gait Distance (Feet): 300 Feet Assistive device: Rolling walker (2 wheeled) Gait Pattern/deviations: Decreased stance time - right;Antalgic;Step-through pattern Gait velocity: decr    General Gait Details: verbal cues for heel strike, RW positioning, step length, HR 70-86 bpm during ambulation   Stairs             Wheelchair Mobility    Modified Rankin (Stroke Patients Only)       Balance                                            Cognition Arousal/Alertness: Awake/alert Behavior During Therapy: WFL for tasks assessed/performed Overall Cognitive Status: Within Functional Limits for tasks assessed                                        Exercises Total Joint Exercises Heel Slides: AROM;10 reps;5 reps;Seated Long Arc Quad: AROM;Right;5 reps    General Comments        Pertinent Vitals/Pain Pain Assessment: 0-10 Pain Score: 3  Pain Location: R lateral knee  Pain Descriptors / Indicators: Sore;Discomfort Pain Intervention(s): Limited activity within patient's tolerance;Repositioned;Monitored during session    Home Living                      Prior Function            PT Goals (current goals can now be found in the care plan section) Progress towards PT goals: Progressing toward goals    Frequency    7X/week      PT Plan Current plan remains appropriate    Co-evaluation              AM-PAC PT "6 Clicks" Daily Activity  Outcome Measure  Difficulty turning over in bed (including adjusting bedclothes, sheets and blankets)?: A Little Difficulty moving from lying on back to sitting on the side of the bed? : A Little  Difficulty sitting down on and standing up from a chair with arms (e.g., wheelchair, bedside commode, etc,.)?: A Little Help needed moving to and from a bed to chair (including a wheelchair)?: A Little Help needed walking in hospital room?: A Little Help needed climbing 3-5 steps with a railing? : A Little 6 Click Score: 18    End of Session   Activity Tolerance: Patient tolerated treatment well Patient left: with call bell/phone within reach;with family/visitor present;in chair Nurse Communication: Mobility status PT Visit Diagnosis: Difficulty in walking, not elsewhere classified (R26.2);Other abnormalities of gait and mobility (R26.89)     Time: 8182-9937 PT Time Calculation (min) (ACUTE ONLY): 16 min  Charges:  $Gait Training: 8-22 mins                     Carmelia Bake, PT, DPT Acute Rehabilitation Services Office: [phone] Pager: [phone]  Trena Platt 03/04/2018, 4:42 PM

## 2018-03-05 ENCOUNTER — Telehealth: Payer: Self-pay | Admitting: Internal Medicine

## 2018-03-05 MED ORDER — METOPROLOL TARTRATE 25 MG PO TABS: 12.5000 mg | ORAL_TABLET | Freq: Two times a day (BID) | ORAL | 3 refills | Status: AC

## 2018-03-05 MED ORDER — METOPROLOL TARTRATE 25 MG PO TABS: 25.0000 mg | ORAL_TABLET | Freq: Two times a day (BID) | ORAL | 3 refills | Status: DC

## 2018-03-05 MED ORDER — AMIODARONE HCL 200 MG PO TABS: 200.0000 mg | ORAL_TABLET | Freq: Every day | ORAL | 3 refills | Status: DC

## 2018-03-05 NOTE — Telephone Encounter (Signed)
Spoke with daughter Pt had knee sx on Tue.  In OR- HR went down to 40s, SBP 60-70s according to dc note.  .   Anesthesia thought HR was related to her dose of amiodarone (200 mg daily).  Also was taking metoprolol 25 mg BID.  Was sent home to 1--ask cardiology about taking amiodarone 2--decreased metoprolol to 25 mg daily  Went home yesterday, last night HR 60s, today 80s.  Today it feels fast and irregular to her. Gave her metoprolol this am During call BP 158/68, HR 80.  Daughter feels strongly that pt was doing well on her previous regimen - and would like to go back to metoprolol 25 mg BID and amiodarone 200 mg HS.  Advised to return to previous regimen but check HR before each dose of metoprolol and hold if HR <60. Daughter and patient verbalize understanding of this plan. Reviewed with Dr. Burt Knack (DOD) who is in agreement with this plan.

## 2018-03-05 NOTE — Telephone Encounter (Signed)
Fay Records, MD at 03/05/2018 12:53 PM   Status: Signed    Reviewed hsop records   REcomm cut back on metoprolol 12.5 bid  Cont amiodarone       Spoke with patient's daughter and informed. Appointment scheduled for 03/25/18.

## 2018-03-05 NOTE — Addendum Note (Signed)
Addended by: Rodman Key on: 03/05/2018 01:20 PM   Modules accepted: Orders

## 2018-03-05 NOTE — Telephone Encounter (Signed)
Reviewed hsop records   REcomm cut back on metoprolol 12.5 bid  Cont amiodarone

## 2018-03-15 ENCOUNTER — Ambulatory Visit: Payer: Medicare Other

## 2018-03-25 ENCOUNTER — Ambulatory Visit: Payer: Medicare Other | Admitting: Physician Assistant

## 2018-03-25 VITALS — BP 124/58 | HR 95 | Ht 64.0 in | Wt 171.2 lb

## 2018-03-25 DIAGNOSIS — I480 Paroxysmal atrial fibrillation: Secondary | ICD-10-CM | POA: Diagnosis not present

## 2018-03-25 DIAGNOSIS — N183 Chronic kidney disease, stage 3 unspecified: Secondary | ICD-10-CM

## 2018-03-25 DIAGNOSIS — I5032 Chronic diastolic (congestive) heart failure: Secondary | ICD-10-CM | POA: Diagnosis not present

## 2018-03-25 DIAGNOSIS — D649 Anemia, unspecified: Secondary | ICD-10-CM

## 2018-03-25 DIAGNOSIS — D508 Other iron deficiency anemias: Secondary | ICD-10-CM

## 2018-03-25 MED ORDER — FUROSEMIDE 40 MG PO TABS: 40.0000 mg | ORAL_TABLET | Freq: Every day | ORAL | 3 refills | Status: DC

## 2018-03-25 MED ORDER — POTASSIUM CHLORIDE CRYS ER 20 MEQ PO TBCR: 20.0000 meq | EXTENDED_RELEASE_TABLET | Freq: Every day | ORAL | 3 refills | Status: DC

## 2018-03-25 NOTE — Patient Instructions (Addendum)
Medication Instructions:  1. STOP HCTZ  2. CHANGE LASIX TO 40 MG ONCE A DAY EVERY DAY; NEW RX HAS BEEN SENT IN   3. CHANGE POTASSIUM TO 20 MEQ ONCE A DAY EVERY DAY; NEW RX HAS BEEN SENT IN If you need a refill on your cardiac medications before your next appointment, please call your pharmacy.   Lab work: TODAY BMET, CBC If you have labs (blood work) drawn today and your tests are completely normal, you will receive your results only by: Marland Kitchen MyChart Message (if you have MyChart) OR . A paper copy in the mail If you have any lab test that is abnormal or we need to change your treatment, we will call you to review the results.  Testing/Procedures: NONE ORDERED TODAY  Follow-Up: At [facility], you and your health needs are our priority.  As part of our continuing mission to provide you with exceptional heart care, we have created designated Provider Care Teams.  These Care Teams include your primary Cardiologist (physician) and Advanced Practice Providers (APPs -  Physician Assistants and Nurse Practitioners) who all work together to provide you with the care you need, when you need it. You will need a follow up appointment in:  3 months.    You may see Dorris Carnes, MD AT 4:20 ON 06/21/18.  YOU ARE BEING REFERRED TO NEPHROLOGY; THEIR OFFICE WILL CALL YOU WITH AN APPT.   Any Other Special Instructions Will Be Listed Below (If Applicable).

## 2018-03-25 NOTE — Progress Notes (Signed)
Cardiology Office Note    Date:  03/25/2018   ID:  Gloria [name], Gloria [name] Sep 20, 1940, MRN [id number]  PCP:  Haywood Pao, MD  Cardiologist: Dr. Harrington Challenger EP:Dr. Allred CPAP: Dr. Radford Pax  Chief Complaint: Hospital follow up   History of Present Illness:   Gloria [name] is a 77 y.o. female with a history of PAF, chronic diastolic CHF, CKD stage III-IV, HTN, CV dz, HL, DM  presents for follow up.   Seen in ED on 05/28/17 with palpitations and SOB  Found to be in atrial fibrillation. Rate difficlut to control, she failed cardioversion/sotalol and flecainide. Reverted to atrial fib >> Sent home on amiodarone with outpt f/u for cardioversion. She converted to SR with amio alone.  She was doing well on cardiac stand point when last seen by Dr. Harrington Challenger 02/01/18. Admitted 03/02/18-/03/04/18 for total right Knee Arthroplasty. She had postop and intraop HR in 40s, and hypotension in 60s-70s. Discharged home with outpatient cardiology follow up. Dr. Harrington Challenger recommended reducing metoprolol to 12.5mg  BID and continue amiodarone.   Here today for follow up. HR stable at 60-70s at home.  Does PT everyday without chest pain or dyspnea. Has noted LE edema without orthopnea or PND. Uses low sodium diet. Takes lasix 40mg  and 20mg  daily on alternating days with daily HCTZ. Edema same. No chest pain. Has PICC for abx for UTI.    Past Medical History:  Diagnosis Date  . Anticoagulant long-term use    eliquis  . B12 deficiency   . Carotid stenosis, bilateral    followed by dr Trula Slade--- per last duplex 08-11-2017  patent right ICA,  left ICA 40-59%,  left CEA >50%  . Chronic anemia   . CKD (chronic kidney disease), stage III ([place])   . Diabetic retinopathy ([place])   . Diastolic CHF, chronic ([place])   . Family history of adverse reaction to anesthesia    daughter - violent  . GERD (gastroesophageal reflux disease)   . Hyperlipidemia   . Hypertension   . Macular degeneration    Right eye only,  loss  of peripheral vision  . MGUS (monoclonal gammopathy of unknown significance)    followed by dr Lindi Adie ([facility])  . OA (osteoarthritis)    knees, fingers, back  . OSA on CPAP 11/05/2017   Severe sleep apnea during REM sleep with oxygen saturations dropping to 78%.  Due to severe nocturnal hypoxemia as well as significant sleep apnea during REM sleep she is now on CPAP at  11 cm H2O  . PAF (paroxysmal atrial fibrillation) ([place]) 05/2017   primany cardiologist-  dr Dorris Carnes  . Peripheral neuropathy    bilateral feet post back surgery  . Peripheral vascular disease ([facility])    hx rt carotid s/p cea  . PONV (postoperative nausea and vomiting)   . Primary localized osteoarthritis of right knee 03/02/2018  . Type 2 diabetes mellitus treated with insulin ([place])    followed by pcp    Past Surgical History:  Procedure Laterality Date  . CARDIAC CATHETERIZATION  05-27-2005   dr Doreatha Lew   mild to moderate coronary atherosclerosis, 60% ostial narrowing bifurcation OM and 60% D2 w/ minor irregularities otherwise ,  normal LVF  . CARDIOVASCULAR STRESS TEST  02-26-2012    dr Aundra Dubin   normal dobutamin nuclera study w/ no ischemia/  normal LV funciton and wall motion , ef 66%  . CARDIOVERSION N/A 06/07/2017   Procedure: CARDIOVERSION;  Surgeon: Thompson Grayer, MD;  Location: [facility] OR;  Service: Cardiovascular;  Laterality: N/A;  . CAROTID ENDARTERECTOMY Right 06-06-2005   dr Amedeo Plenty @[facility]   . CATARACT EXTRACTION W/ INTRAOCULAR LENS  IMPLANT, BILATERAL  2018  . CHOLECYSTECTOMY OPEN  1968   AND APPENDECTOMY  . EYE SURGERY Right 2017    Retinal tear-   . LUMBAR LAMINECTOMY/DECOMPRESSION MICRODISCECTOMY  02/27/2012   Procedure: LUMBAR LAMINECTOMY/DECOMPRESSION MICRODISCECTOMY 2 LEVELS;  Surgeon: Kristeen Miss, MD;  Location: [facility];  Service: Neurosurgery;  Laterality: N/A;  Lumbar two-three, four-five Laminectomy/foraminotomy  . TONSILLECTOMY  AGE 50  . TOTAL KNEE ARTHROPLASTY Right 03/02/2018    Procedure: RIGHT TOTAL KNEE ARTHROPLASTY;  Surgeon: Marchia Bond, MD;  Location: [facility] ORS;  Service: Orthopedics;  Laterality: Right;  . TRANSTHORACIC ECHOCARDIOGRAM  06-02-2017   dr p. ross   mild LVH,  ef 55%, grade 2 diastoic dysfunction/  trivial MR and TR/  moderate LAE/  mild RAE    Current Medications: Prior to Admission medications   Medication Sig Start Date End Date Taking? Authorizing Provider  amiodarone (PACERONE) 200 MG tablet Take 1 tablet (200 mg total) by mouth daily. 03/05/18   Fay Records, MD  atorvastatin (LIPITOR) 80 MG tablet Take 80 mg by mouth every morning.     [provider]  carboxymethylcellulose (REFRESH PLUS) 0.5 % SOLN Place 1 drop into both eyes 3 (three) times daily as needed (for dry/irritated eyes.).     [provider]  cyanocobalamin (,VITAMIN B-12,) 1000 MCG/ML injection Inject 1 mL (1,000 mcg total) into the muscle every 30 (thirty) days for 10 doses. 02/15/18 11/13/18  Nicholas Lose, MD  diphenhydrAMINE (BENADRYL) 25 MG tablet Take 25 mg by mouth at bedtime.     [provider]  docusate sodium (STOOL SOFTENER) 100 MG capsule Take 1 capsule (100 mg total) by mouth daily as needed for mild constipation. Patient taking differently: Take 100 mg by mouth every evening.  02/15/18   Nicholas Lose, MD  ELIQUIS 5 MG TABS tablet TAKE 1 TABLET BY MOUTH TWICE A DAY 02/22/18   Fay Records, MD  fenofibrate 160 MG tablet Take 160 mg by mouth every morning.     [provider]  furosemide (LASIX) 40 MG tablet take 1/2 tablet by mouth daily only as needed for swelling Patient taking differently: Take 20-40 mg by mouth daily as needed for fluid.  10/20/17   Aicia Babinski, Crista Luria, PA  hydrochlorothiazide (HYDRODIURIL) 25 MG tablet Take 1 tablet (25 mg total) by mouth daily. Patient taking differently: Take 25 mg by mouth 2 (two) times daily.  02/15/18   Nicholas Lose, MD  HYDROcodone-acetaminophen (NORCO) 5-325 MG tablet Take 1-2 tablets by  mouth every 6 (six) hours as needed for moderate pain. MAXIMUM TOTAL ACETAMINOPHEN DOSE IS 4000 MG PER DAY 03/02/18   Marchia Bond, MD  JANUVIA 50 MG tablet Take 50 mg by mouth every morning.  05/20/17   [provider]  LANTUS SOLOSTAR 100 UNIT/ML Solostar Pen Inject 45 Units into the skin at bedtime. Per sliding scale 07/21/17   [provider]  lidocaine (LIDODERM) 5 % Place 1 patch onto the skin daily as needed (for pain). Remove & Discard patch within 12 hours or as directed by MD    [provider]  Menthol, Topical Analgesic, (BIOFREEZE EX) Apply 1 application topically 2 (two) times daily as needed (for pain).     [provider]  metoprolol tartrate (LOPRESSOR) 25 MG tablet Take 0.5 tablets (12.5 mg total)  by mouth 2 (two) times daily. 03/05/18   Fay Records, MD  Multiple Vitamins-Minerals (PRESERVISION AREDS 2 PO) Take 1 capsule by mouth 2 (two) times daily.    [provider]  Needles & Syringes MISC 1 Units by Does not apply route every 30 (thirty) days. Patient not taking: Reported on 02/16/2018 02/15/18   Nicholas Lose, MD  omeprazole (PRILOSEC) 20 MG capsule Take 20 mg by mouth daily before breakfast.     [provider]  potassium chloride SA (K-DUR,KLOR-CON) 20 MEQ tablet Take 1/2 tablet daily for 1 week then take 1/2 tablet only when you take Lasix Patient taking differently: Take 20 mEq by mouth daily as needed (with Lasix).  10/08/17   Neeva Trew, Crista Luria, PA  promethazine (PHENERGAN) 12.5 MG tablet Take 1 tablet (12.5 mg total) by mouth every 6 (six) hours as needed for nausea or vomiting. 03/02/18   Marchia Bond, MD  sennosides-docusate sodium (SENOKOT-S) 8.6-50 MG tablet Take 2 tablets by mouth daily. 03/02/18   Marchia Bond, MD  traMADol (ULTRAM) 50 MG tablet Take 1-2 tablets (50-100 mg total) by mouth every 6 (six) hours as needed for moderate pain. 03/02/18   Marchia Bond, MD    Allergies:   Iodinated diagnostic  agents; Iohexol; Codeine; Oxycodone; Levaquin [levofloxacin in d5w]; and Propoxyphene n-acetaminophen   Social History   Socioeconomic History  . Marital status: Divorced    Spouse name: Not on file  . Number of children: Not on file  . Years of education: Not on file  . Highest education level: Not on file  Occupational History  . Occupation: Critical care nurse  Social Needs  . Financial resource strain: Not on file  . Food insecurity:    Worry: Not on file    Inability: Not on file  . Transportation needs:    Medical: Not on file    Non-medical: Not on file  Tobacco Use  . Smoking status: Former Smoker    Packs/day: 0.25    Years: 32.00    Pack years: 8.00    Types: Cigarettes    Last attempt to quit: 02/20/2004    Years since quitting: 14.1  . Smokeless tobacco: Never Used  Substance and Sexual Activity  . Alcohol use: No  . Drug use: Never  . Sexual activity: Yes    Birth control/protection: Post-menopausal  Lifestyle  . Physical activity:    Days per week: Not on file    Minutes per session: Not on file  . Stress: Not on file  Relationships  . Social connections:    Talks on phone: Not on file    Gets together: Not on file    Attends religious service: Not on file    Active member of club or organization: Not on file    Attends meetings of clubs or organizations: Not on file    Relationship status: Not on file  Other Topics Concern  . Not on file  Social History Narrative  . Not on file     Family History:  The patient's family history includes Breast cancer (age of onset: 50) in her daughter; Heart disease in her father, mother, and sister.   ROS:   Please see the history of present illness.    ROS All other systems reviewed and are negative.   PHYSICAL EXAM:   VS:  BP (!) 124/58   Pulse 95   Ht 5\' 4"  (1.626 m)   Wt 171 lb 3.2 oz (77.7 kg)  BMI 29.39 kg/m    GEN: frail elderly female in no acute distress  HEENT: normal  Neck: no JVD, carotid  bruits, or masses Cardiac: RRR; no murmurs, rubs, or gallops 1 + BL LE  edema  Respiratory:  clear to auscultation bilaterally, normal work of breathing GI: soft, nontender, nondistended, + BS MS: no deformity or atrophy  Skin: warm and dry, no rash Neuro:  Alert and Oriented x 3, Strength and sensation are intact Psych: euthymic mood, full affect  Wt Readings from Last 3 Encounters:  03/25/18 171 lb 3.2 oz (77.7 kg)  03/03/18 184 lb 11.9 oz (83.8 kg)  02/15/18 167 lb 14.4 oz (76.2 kg)      Studies/Labs Reviewed:   EKG:  EKG is not ordered today.   Recent Labs: 06/01/2017: B Natriuretic Peptide 1,532.3 10/19/2017: TSH 3.885 02/15/2018: ALT 33 03/04/2018: BUN 50; Creatinine, Ser 2.15; Hemoglobin 7.9; Platelets 228; Potassium 4.9; Sodium 138   Lipid Panel    Component Value Date/Time   CHOL 113 06/02/2017 0514   TRIG 180 (H) 06/02/2017 0514   HDL 14 (L) 06/02/2017 0514   CHOLHDL 8.1 06/02/2017 0514   VLDL 36 06/02/2017 0514   LDLCALC 63 06/02/2017 0514    Additional studies/ records that were reviewed today include:   Echocardiogram: 05/2017 Study Conclusions  - Left ventricle: The cavity size was normal. Wall thickness was   increased in a pattern of mild LVH. The estimated ejection   fraction was 55%. Wall motion was normal; there were no regional   wall motion abnormalities. Features are consistent with a   pseudonormal left ventricular filling pattern, with concomitant   abnormal relaxation and increased filling pressure (grade 2   diastolic dysfunction). - Aortic valve: There was no stenosis. - Mitral valve: Mildly calcified annulus. Mildly calcified leaflets   . There was trivial regurgitation. - Left atrium: The atrium was moderately dilated. - Right ventricle: The cavity size was normal. Systolic function   was normal. - Right atrium: The atrium was mildly dilated. - Tricuspid valve: Peak RV-RA gradient (S): 41 mm Hg. - Pulmonary arteries: PA peak pressure:  49 mm Hg (S). - Systemic veins: IVC measured 2.0 cm with < 50% respirophasic   variation, suggesting RA pressure 8 mmHg. - Pericardium, extracardiac: A trivial pericardial effusion was   identified posterior to the heart.  Impressions:  - Normal LV size with mild LV hypertrophy. EF 55%. Moderate   diastolic dysfunction. Normal RV size and systolic function. No   significant valvular abnormalities. Biatrial enlargement.   Mild-moderate pulmonary hypertension.  ASSESSMENT & PLAN:    1. Paroxysmal atrial fibrillation Failed sotalol and flecianide. She was maintaining sinus on amiodarone. Bradycardia intra and post op after knee surgery few days ago. Continue metoprolol to 12.5mg  BID and amiodarone 200mg  daily. Eliquis for anticoagulation. Sinus rhythm by exam.   2. HTN - BP stable on current medications.   3. Chronic diastolic CHF - edema improves on high dose of lasix. Increase lasix to 40mg  qd with Kdur 24meq daily. Stop HCTZ. BMET today.   4. CKD stage III-IV - Baselline ~1.5. Recently increase to 2.3. Nephrology referral.  5. Anemia - Required transfusion during admission. Will check CBC.     Medication Adjustments/Labs and Tests Ordered: Current medicines are reviewed at length with the patient today.  Concerns regarding medicines are outlined above.  Medication changes, Labs and Tests ordered today are listed in the Patient Instructions below. Patient Instructions  Medication Instructions:  1.  STOP HCTZ  2. CHANGE LASIX TO 40 MG ONCE A DAY EVERY DAY; NEW RX HAS BEEN SENT IN   3. CHANGE POTASSIUM TO 20 MEQ ONCE A DAY EVERY DAY; NEW RX HAS BEEN SENT IN If you need a refill on your cardiac medications before your next appointment, please call your pharmacy.   Lab work: TODAY BMET, CBC If you have labs (blood work) drawn today and your tests are completely normal, you will receive your results only by: Marland Kitchen MyChart Message (if you have MyChart) OR . A paper copy in the  mail If you have any lab test that is abnormal or we need to change your treatment, we will call you to review the results.  Testing/Procedures: NONE ORDERED TODAY  Follow-Up: At [facility], you and your health needs are our priority.  As part of our continuing mission to provide you with exceptional heart care, we have created designated Provider Care Teams.  These Care Teams include your primary Cardiologist (physician) and Advanced Practice Providers (APPs -  Physician Assistants and Nurse Practitioners) who all work together to provide you with the care you need, when you need it. You will need a follow up appointment in:  3 months.    You may see Dorris Carnes, MD AT 4:20 ON 06/21/18.  YOU ARE BEING REFERRED TO NEPHROLOGY; THEIR OFFICE WILL CALL YOU WITH AN APPT.   Any Other Special Instructions Will Be Listed Below (If Applicable).       Jarrett Soho, [place]  03/25/2018 3:50 PM    [facility] [address], [place], [place]  [address] Phone: [phone]; Fax: [phone]

## 2018-03-26 ENCOUNTER — Telehealth: Payer: Self-pay | Admitting: *Deleted

## 2018-03-26 LAB — BASIC METABOLIC PANEL
BUN / CREAT RATIO: 17 (ref 12–28)
BUN: 23 mg/dL (ref 8–27)
CALCIUM: 9 mg/dL (ref 8.7–10.3)
CO2: 26 mmol/L (ref 20–29)
Chloride: 101 mmol/L (ref 96–106)
Creatinine, Ser: 1.33 mg/dL — ABNORMAL HIGH (ref 0.57–1.00)
GFR, EST AFRICAN AMERICAN: 44 mL/min/{1.73_m2} — AB (ref >59)
GFR, EST NON AFRICAN AMERICAN: 39 mL/min/{1.73_m2} — AB (ref >59)
Glucose: 95 mg/dL (ref 65–99)
POTASSIUM: 4.6 mmol/L (ref 3.5–5.2)
Sodium: 142 mmol/L (ref 134–144)

## 2018-03-26 LAB — CBC
HEMOGLOBIN: 10 g/dL — AB (ref 11.1–15.9)
Hematocrit: 32.9 % — ABNORMAL LOW (ref 34.0–46.6)
MCH: 26.6 pg (ref 26.6–33.0)
MCHC: 30.4 g/dL — AB (ref 31.5–35.7)
MCV: 88 fL (ref 79–97)
Platelets: 337 10*3/uL (ref 150–450)
RBC: 3.76 x10E6/uL — ABNORMAL LOW (ref 3.77–5.28)
RDW: 14.4 % (ref 12.3–15.4)
WBC: 11.6 10*3/uL — ABNORMAL HIGH (ref 3.4–10.8)

## 2018-03-26 NOTE — Telephone Encounter (Signed)
Called pt re: lab results.  Left a message for her to call back.

## 2018-03-26 NOTE — Telephone Encounter (Signed)
-----   Message from [place], [place] sent at 03/26/2018  8:47 AM EST ----- Renal function improved significantly, back to baseline. Hgb improved as well. Forward labs to PCP.

## 2018-03-28 ENCOUNTER — Telehealth: Payer: Self-pay | Admitting: Physician Assistant

## 2018-03-28 NOTE — Telephone Encounter (Signed)
Gloria [name] is a 77 y.o. female with PAF, chronic diastolic CHF, CKD stage III-IV, HTN, CV dz, HL, DM.  She had some complications after knee surgery last month.  She was anemic and received transfusion and her creatinine worsened. She was seen in follow up 03/25/18 by Gloria Kail, PA-C.  She was stable and labs indicated improved Creatinine and hemoglobin.  Of note, the WBC was elevated. The patient's daughter called the answering service today Gloria [name], DPR is on file) with concerns about lethargy.  The patient has been tired today and appears pale.  No other complaints. BP and HR are ok.  Of note, she does have a PICC line and is getting antibiotics for a UTI. PLAN:  1. I have advised Gloria [name] to keep an eye on Gloria [name].  If her lethargy worsens or she has other symptoms, she should go to the ED for evaluation. 2. She can hold her Lasix tomorrow to see if this helps Gloria [name] noted brisk diuresis yesterday with Lasix). 3. HHRN will see the patient tomorrow as planned. Gloria Dopp, PA-C    03/28/2018 12:13 PM

## 2018-04-19 ENCOUNTER — Telehealth: Payer: Self-pay | Admitting: Internal Medicine

## 2018-04-19 DIAGNOSIS — I5032 Chronic diastolic (congestive) heart failure: Secondary | ICD-10-CM

## 2018-04-19 DIAGNOSIS — Z79899 Other long term (current) drug therapy: Secondary | ICD-10-CM

## 2018-04-19 DIAGNOSIS — I10 Essential (primary) hypertension: Secondary | ICD-10-CM

## 2018-04-19 DIAGNOSIS — I4891 Unspecified atrial fibrillation: Secondary | ICD-10-CM

## 2018-04-19 DIAGNOSIS — I480 Paroxysmal atrial fibrillation: Secondary | ICD-10-CM

## 2018-04-19 NOTE — Telephone Encounter (Signed)
Review of clinic note from Nov 11 it looks like she was taken off of HCTZ when lasix was increased It is ok for her to try adding back HCTZ    Would recomm she have BMET checked in 10 days   Keep following BP

## 2018-04-19 NOTE — Telephone Encounter (Signed)
BP is 150-160, one time 180.   this AM (before meds) 164/62, 78 HR is normally 60s on amiodarone, and has been 70s - this is unusual. States BP has been increasing since she was taken off hctz 25 mg, however  - metoprolol was reduced to 12.5 mg BID after her surgery in Oct.  No current BP reading.  BP comes down after meds but not as much as it should be per pt. Was seen by B. Bhagat 03/25/18, hctz was stopped.   Aware we will call her back with recommendations from Dr. Harrington Challenger.

## 2018-04-19 NOTE — Telephone Encounter (Signed)
Patient's daughter called stating her mother is having BP issues.  She does not have the BP readings with her as she is driving when she called.   Patient is having high HR, and feeling tired.

## 2018-04-20 MED ORDER — HYDROCHLOROTHIAZIDE 25 MG PO TABS: 25.0000 mg | ORAL_TABLET | Freq: Two times a day (BID) | ORAL | 3 refills | Status: AC

## 2018-04-20 NOTE — Telephone Encounter (Signed)
Pt advised Dr. Harrington Challenger' recommendations to restart her HCTZ.Marland Kitchen She was on HCTZ 25mg  po bid and will add it back and will keep track of her BP and let us know if she has any problems.. Pt to return on 04/30/18 for a BMET.

## 2018-04-21 ENCOUNTER — Other Ambulatory Visit (HOSPITAL_COMMUNITY): Payer: Self-pay | Admitting: Nurse Practitioner

## 2018-04-30 ENCOUNTER — Other Ambulatory Visit: Payer: Medicare Other | Admitting: *Deleted

## 2018-04-30 DIAGNOSIS — Z79899 Other long term (current) drug therapy: Secondary | ICD-10-CM

## 2018-04-30 DIAGNOSIS — I4891 Unspecified atrial fibrillation: Secondary | ICD-10-CM

## 2018-04-30 DIAGNOSIS — I5032 Chronic diastolic (congestive) heart failure: Secondary | ICD-10-CM

## 2018-04-30 DIAGNOSIS — I10 Essential (primary) hypertension: Secondary | ICD-10-CM

## 2018-04-30 DIAGNOSIS — I480 Paroxysmal atrial fibrillation: Secondary | ICD-10-CM

## 2018-04-30 LAB — BASIC METABOLIC PANEL
BUN / CREAT RATIO: 22 (ref 12–28)
BUN: 32 mg/dL — AB (ref 8–27)
CALCIUM: 9.3 mg/dL (ref 8.7–10.3)
CHLORIDE: 100 mmol/L (ref 96–106)
CO2: 25 mmol/L (ref 20–29)
Creatinine, Ser: 1.46 mg/dL — ABNORMAL HIGH (ref 0.57–1.00)
GFR calc Af Amer: 39 mL/min/{1.73_m2} — ABNORMAL LOW (ref >59)
GFR calc non Af Amer: 34 mL/min/{1.73_m2} — ABNORMAL LOW (ref >59)
GLUCOSE: 98 mg/dL (ref 65–99)
Potassium: 4.3 mmol/L (ref 3.5–5.2)
Sodium: 142 mmol/L (ref 134–144)

## 2018-05-17 ENCOUNTER — Telehealth: Payer: Self-pay | Admitting: Hematology and Oncology

## 2018-05-17 ENCOUNTER — Inpatient Hospital Stay: Payer: Medicare Other | Admitting: Hematology and Oncology

## 2018-05-17 ENCOUNTER — Inpatient Hospital Stay: Payer: Medicare Other | Attending: Hematology and Oncology

## 2018-05-17 VITALS — BP 150/57 | HR 59 | Temp 98.2°F | Resp 18 | Ht 64.0 in | Wt 169.9 lb

## 2018-05-17 DIAGNOSIS — N183 Chronic kidney disease, stage 3 unspecified: Secondary | ICD-10-CM

## 2018-05-17 DIAGNOSIS — D472 Monoclonal gammopathy: Secondary | ICD-10-CM

## 2018-05-17 DIAGNOSIS — D649 Anemia, unspecified: Secondary | ICD-10-CM | POA: Diagnosis not present

## 2018-05-17 DIAGNOSIS — Z79899 Other long term (current) drug therapy: Secondary | ICD-10-CM

## 2018-05-17 LAB — CBC WITH DIFFERENTIAL (CANCER CENTER ONLY)
Abs Immature Granulocytes: 0.05 10*3/uL (ref 0.00–0.07)
BASOS ABS: 0 10*3/uL (ref 0.0–0.1)
Basophils Relative: 0 %
EOS PCT: 2 %
Eosinophils Absolute: 0.3 10*3/uL (ref 0.0–0.5)
HEMATOCRIT: 33.9 % — AB (ref 36.0–46.0)
HEMOGLOBIN: 10 g/dL — AB (ref 12.0–15.0)
Immature Granulocytes: 1 %
LYMPHS ABS: 2.6 10*3/uL (ref 0.7–4.0)
LYMPHS PCT: 24 %
MCH: 26.5 pg (ref 26.0–34.0)
MCHC: 29.5 g/dL — AB (ref 30.0–36.0)
MCV: 89.7 fL (ref 80.0–100.0)
Monocytes Absolute: 0.8 10*3/uL (ref 0.1–1.0)
Monocytes Relative: 7 %
NEUTROS PCT: 66 %
NRBC: 0 % (ref 0.0–0.2)
Neutro Abs: 7.4 10*3/uL (ref 1.7–7.7)
Platelet Count: 244 10*3/uL (ref 150–400)
RBC: 3.78 MIL/uL — AB (ref 3.87–5.11)
RDW: 16.2 % — AB (ref 11.5–15.5)
WBC: 11.1 10*3/uL — AB (ref 4.0–10.5)

## 2018-05-17 LAB — IRON AND TIBC
IRON: 48 ug/dL (ref 41–142)
SATURATION RATIOS: 14 % — AB (ref 21–57)
TIBC: 354 ug/dL (ref 236–444)
UIBC: 305 ug/dL (ref 120–384)

## 2018-05-17 LAB — RETICULOCYTES
IMMATURE RETIC FRACT: 18.8 % — AB (ref 2.3–15.9)
RBC.: 3.78 MIL/uL — ABNORMAL LOW (ref 3.87–5.11)
Retic Count, Absolute: 72.6 10*3/uL (ref 19.0–186.0)
Retic Ct Pct: 1.9 % (ref 0.4–3.1)

## 2018-05-17 LAB — FERRITIN: FERRITIN: 61 ng/mL (ref 11–307)

## 2018-05-17 MED ORDER — POTASSIUM CHLORIDE CRYS ER 20 MEQ PO TBCR: 20.0000 meq | EXTENDED_RELEASE_TABLET | ORAL | 3 refills | Status: DC | PRN

## 2018-05-17 MED ORDER — FUROSEMIDE 40 MG PO TABS: 40.0000 mg | ORAL_TABLET | ORAL | 3 refills | Status: DC | PRN

## 2018-05-17 MED ORDER — CYANOCOBALAMIN 1000 MCG/ML IJ SOLN: 1000.0000 ug | INTRAMUSCULAR | 3 refills | Status: DC

## 2018-05-17 NOTE — Progress Notes (Signed)
Patient Care Team: Tisovec, Fransico Him, MD as PCP - General (Internal Medicine) Fay Records, MD as PCP - Cardiology (Cardiology) Marchia Bond, MD as Consulting Physician (Orthopedic Surgery)  DIAGNOSIS:  Encounter Diagnosis  Name Primary?  Marland Kitchen MGUS (monoclonal gammopathy of unknown significance)     CHIEF COMPLIANT: Fatigue  INTERVAL HISTORY: Gloria [name] is a 77 year old with above-mentioned history of MGUS and anemia of chronic kidney disease who is here to review the results of her labs.  She has been anemic and 2 months ago she had knee replacement surgery and received 1 unit of packed red cells.  She is recovered very well from the surgery in spite of some immediate postoperative infection complications.  She is however feeling extremely tired and she continues to give herself B12 injections at home.  Her daughter wonders if there is a component of depression because of several family issues that are ongoing.  REVIEW OF SYSTEMS:   Constitutional: Denies fevers, chills or abnormal weight loss Eyes: Denies blurriness of vision Ears, nose, mouth, throat, and face: Denies mucositis or sore throat Respiratory: Denies cough, dyspnea or wheezes Cardiovascular: Denies palpitation, chest discomfort Gastrointestinal:  Denies nausea, heartburn or change in bowel habits Skin: Denies abnormal skin rashes Lymphatics: Denies new lymphadenopathy or easy bruising Neurological:Denies numbness, tingling or new weaknesses Behavioral/Psych: Feelings of depression Extremities: No lower extremity edema Breast:  denies any pain or lumps or nodules in either breasts All other systems were reviewed with the patient and are negative.  I have reviewed the past medical history, past surgical history, social history and family history with the patient and they are unchanged from previous note.  ALLERGIES:  is allergic to iodinated diagnostic agents; iohexol; codeine; oxycodone; levaquin  [levofloxacin in d5w]; and propoxyphene n-acetaminophen.  MEDICATIONS:  Current Outpatient Medications  Medication Sig Dispense Refill  . amiodarone (PACERONE) 200 MG tablet TAKE 1 TABLET BY MOUTH EVERY DAY 90 tablet 3  . atorvastatin (LIPITOR) 80 MG tablet Take 80 mg by mouth every morning.     . carboxymethylcellulose (REFRESH PLUS) 0.5 % SOLN Place 1 drop into both eyes 3 (three) times daily as needed (for dry/irritated eyes.).     Marland Kitchen cyanocobalamin (,VITAMIN B-12,) 1000 MCG/ML injection Inject 1 mL (1,000 mcg total) into the muscle every 30 (thirty) days for 10 doses. 10 mL 0  . diphenhydrAMINE (BENADRYL) 25 MG tablet Take 25 mg by mouth at bedtime.     Marland Kitchen ELIQUIS 5 MG TABS tablet TAKE 1 TABLET BY MOUTH TWICE A DAY 60 tablet 5  . fenofibrate 160 MG tablet Take 160 mg by mouth every morning.     . furosemide (LASIX) 40 MG tablet Take 1 tablet (40 mg total) by mouth daily. 90 tablet 3  . hydrochlorothiazide (HYDRODIURIL) 25 MG tablet Take 1 tablet (25 mg total) by mouth 2 (two) times daily. 180 tablet 3  . HYDROcodone-acetaminophen (NORCO) 5-325 MG tablet Take 1-2 tablets by mouth every 6 (six) hours as needed for moderate pain. MAXIMUM TOTAL ACETAMINOPHEN DOSE IS 4000 MG PER DAY 30 tablet 0  . JANUVIA 50 MG tablet Take 50 mg by mouth every morning.   1  . LANTUS SOLOSTAR 100 UNIT/ML Solostar Pen Inject 45 Units into the skin at bedtime. Per sliding scale    . lidocaine (LIDODERM) 5 % Place 1 patch onto the skin daily as needed (for pain). Remove & Discard patch within 12 hours or as directed by MD    . Menthol,  Topical Analgesic, (BIOFREEZE EX) Apply 1 application topically 2 (two) times daily as needed (for pain).     . metoprolol tartrate (LOPRESSOR) 25 MG tablet Take 0.5 tablets (12.5 mg total) by mouth 2 (two) times daily. 90 tablet 3  . metroNIDAZOLE (FLAGYL) 500 MG tablet Take 500 mg by mouth 2 (two) times daily.    . Multiple Vitamins-Minerals (PRESERVISION AREDS 2 PO) Take 1 capsule by  mouth 2 (two) times daily.    . Needles & Syringes MISC 1 Units by Does not apply route every 30 (thirty) days.  0  . omeprazole (PRILOSEC) 20 MG capsule Take 20 mg by mouth daily before breakfast.     . potassium chloride SA (KLOR-CON M20) 20 MEQ tablet Take 1 tablet (20 mEq total) by mouth daily. 90 tablet 3  . promethazine (PHENERGAN) 12.5 MG tablet Take 1 tablet (12.5 mg total) by mouth every 6 (six) hours as needed for nausea or vomiting. 30 tablet 0  . sennosides-docusate sodium (SENOKOT-S) 8.6-50 MG tablet Take 2 tablets by mouth daily. 30 tablet 1  . traMADol (ULTRAM) 50 MG tablet Take 1-2 tablets (50-100 mg total) by mouth every 6 (six) hours as needed for moderate pain. 30 tablet 0   No current facility-administered medications for this visit.     PHYSICAL EXAMINATION: ECOG PERFORMANCE STATUS: 1 - Symptomatic but completely ambulatory  Vitals:   05/17/18 1011  BP: (!) 150/57  Pulse: (!) 59  Resp: 18  Temp: 98.2 F (36.8 C)  SpO2: 96%   Filed Weights   05/17/18 1011  Weight: 169 lb 14.4 oz (77.1 kg)    GENERAL:alert, no distress and comfortable SKIN: skin color, texture, turgor are normal, no rashes or significant lesions EYES: normal, Conjunctiva are pink and non-injected, sclera clear OROPHARYNX:no exudate, no erythema and lips, buccal mucosa, and tongue normal  NECK: supple, thyroid normal size, non-tender, without nodularity LYMPH:  no palpable lymphadenopathy in the cervical, axillary or inguinal LUNGS: clear to auscultation and percussion with normal breathing effort HEART: regular rate & rhythm and no murmurs and no lower extremity edema ABDOMEN:abdomen soft, non-tender and normal bowel sounds MUSCULOSKELETAL:no cyanosis of digits and no clubbing  NEURO: alert & oriented x 3 with fluent speech, no focal motor/sensory deficits EXTREMITIES: No lower extremity edema   LABORATORY DATA:  I have reviewed the data as listed CMP Latest Ref Rng & Units 04/30/2018  03/25/2018 03/04/2018  Glucose 65 - 99 mg/dL 98 95 209(H)  BUN 8 - 27 mg/dL 32(H) 23 50(H)  Creatinine 0.57 - 1.00 mg/dL 1.46(H) 1.33(H) 2.15(H)  Sodium 134 - 144 mmol/L 142 142 138  Potassium 3.5 - 5.2 mmol/L 4.3 4.6 4.9  Chloride 96 - 106 mmol/L 100 101 107  CO2 20 - 29 mmol/L 25 26 20(L)  Calcium 8.7 - 10.3 mg/dL 9.3 9.0 8.6(L)  Total Protein 6.5 - 8.1 g/dL - - -  Total Bilirubin 0.3 - 1.2 mg/dL - - -  Alkaline Phos 38 - 126 U/L - - -  AST 15 - 41 U/L - - -  ALT 0 - 44 U/L - - -    Lab Results  Component Value Date   WBC 11.1 (H) 05/17/2018   HGB 10.0 (L) 05/17/2018   HCT 33.9 (L) 05/17/2018   MCV 89.7 05/17/2018   PLT 244 05/17/2018   NEUTROABS 7.4 05/17/2018    ASSESSMENT & PLAN:  MGUS (monoclonal gammopathy of unknown significance) Lab review of7/05/2017: Hemoglobin 10.7, MCV 92 previously. B12 95  July 2019: M protein: 0.6 g IgM lambda 02/15/2018: M protein: 0.3 g, IgM lambda, lambda: 31.6, kappa: 57.9, ratio 1.83, creatinine 1.6, hemoglobin 10 05/17/2018: Hemoglobin 10   Current treatment: B12 injections monthly at home I renewed her B12 prescription.  Patient had knee replacement surgery and postoperatively developed infection 2 months ago.  She received 1 unit of blood transfusion during her hospitalization.  Fatigue: Due to anemia as well as probably underlying depression.  I will consult case manager to have a conversation with her. She does not need Aranesp injections at this time.  We will consider this if her anemia continues to get worse.  We can see her every 3 months with labs and follow-up.    No orders of the defined types were placed in this encounter.  The patient has a good understanding of the overall plan. she agrees with it. she will call with any problems that may develop before the next visit here.   Harriette Ohara, MD 05/17/18

## 2018-05-17 NOTE — Telephone Encounter (Signed)
Gave avs and calendar ° °

## 2018-05-17 NOTE — Assessment & Plan Note (Signed)
Lab review of7/05/2017: Hemoglobin 10.7, MCV 92 previously. B12 95 July 2019: M protein: 0.6 g IgM lambda 02/15/2018: M protein: 0.3 g, IgM lambda, lambda: 31.6, kappa: 57.9, ratio 1.83, creatinine 1.6, hemoglobin 10  We plan to recheck her labs today. I will call her with results of this test.  We can see her every 6 months with labs and follow-up.

## 2018-05-18 ENCOUNTER — Encounter: Payer: Self-pay | Admitting: *Deleted

## 2018-05-18 NOTE — Progress Notes (Signed)
[facility] Clinical Social Work  [place] representative received referral from medical oncology for counseling and resources.  CSW contacted patient at home to offer support and assess for needs.  Patient stated her daughter passed away from cancer this past year and she was still "not doing well".  Patient contributed most of her emotional stress to grief after her daughter passed away.  CSW offered support and discussed counseling options.  Patient stated she was familiar with [facility] and had contact information.  CSW also shared information for hospice/grief counseling in [place]; where patient is currently living.  CSW encouraged patient to call CSW if she has any difficulties scheduling or additional needs or concerns.   Johnnye Lana, MSW, LCSW, OSW-C Clinical Social Worker [facility] [phone]

## 2018-06-21 ENCOUNTER — Ambulatory Visit: Payer: Medicare Other | Admitting: Internal Medicine

## 2018-08-09 ENCOUNTER — Telehealth: Payer: Self-pay | Admitting: *Deleted

## 2018-08-09 ENCOUNTER — Telehealth: Payer: Self-pay | Admitting: Hematology and Oncology

## 2018-08-09 NOTE — Telephone Encounter (Signed)
Tried to reach °

## 2018-08-09 NOTE — Telephone Encounter (Signed)
Received call from pt daughter Robing, wanting to reschedule apt for end of this month because of pt's increase risk of obtaining Covid-19. Pt daughter also states that pt is having labs drawn at a facility in [place] and requested our fax number to send over her labs for Dr. Lindi Adie to review.  Pt daughter states that pt is needling a mitral valve clip surgery and is working with cardiology to schedule this.  Based on pt labs and when the surgery will be scheduled, pt daughter wanting Dr. Geralyn Flash advice on if she should have IV iron infusion prior to mitral valve surgery. Instructed pt daughter to send [place] the labs and will have Dr. Lindi Adie review. Message also sent to scheduling to reschedule pts apt with [place].

## 2018-08-10 ENCOUNTER — Inpatient Hospital Stay: Payer: Medicare Other

## 2018-08-17 ENCOUNTER — Ambulatory Visit: Payer: Medicare Other | Admitting: Hematology and Oncology

## 2018-09-06 NOTE — Assessment & Plan Note (Deleted)
Lab review of7/05/2017: Hemoglobin 10.7, MCV 92 previously. B12 95 [id number]: M protein: 0.6g IgM lambda 02/15/2018: M protein: 0.3 g, IgM lambda, lambda: 31.6, kappa: 57.9, ratio 1.83, creatinine 1.6, hemoglobin 10 05/17/2018: Hemoglobin 10   Current treatment: B12 injections monthly at home  Fatigue: Due to anemia as well as probably underlying depression She does not need Aranesp injections at this time.  We will consider this if her anemia continues to get worse.  We can see her every 3 months with labs and follow-up.

## 2018-09-09 ENCOUNTER — Telehealth: Payer: Self-pay | Admitting: Hematology and Oncology

## 2018-09-09 ENCOUNTER — Telehealth: Payer: Self-pay | Admitting: *Deleted

## 2018-09-09 NOTE — Telephone Encounter (Signed)
09/08/2018- received call from pt daughter Gloria [name] requesting pt upcoming labs be sent to [facility] in [place] [phone]).  Lab orders printed off for CBC with Differential, Ferritin, Iron and TIBC, Beta 2 Microglobulin serum, Kappa/Lambda Light Chains, and Multiple Myeloma Panel and faxed to [facility] [phone]).  I called Gloria [name] and let her know that the lab orders were faxed and I received a transmission log stating the status was okay and the fax was delivered.  I educated Gloria [name] she needed to make an apt for this Friday 09/10/2018 at that office to have labs drawn.  Verbalized understanding and very appreciative.

## 2018-09-09 NOTE — Telephone Encounter (Signed)
Called regarding upcoming Webex appointment, patient is notified and Webex invite has been sent.  °

## 2018-09-10 ENCOUNTER — Inpatient Hospital Stay: Payer: Medicare Other

## 2018-09-14 ENCOUNTER — Inpatient Hospital Stay: Payer: Medicare Other | Admitting: Hematology and Oncology

## 2018-09-16 NOTE — Assessment & Plan Note (Signed)
Lab review of7/05/2017: Hemoglobin 10.7, MCV 92 previously. B12 95 [id number]: M protein: 0.6g IgM lambda 02/15/2018: M protein: 0.3 g, IgM lambda, lambda: 31.6, kappa: 57.9, ratio 1.83, creatinine 1.6, hemoglobin 10 05/17/2018: Hemoglobin 10 09/10/2018: Hemoglobin 9.5, MCV 87, ferritin 46, beta-2 microglobulin 5.8 SPEP pending   Current treatment: B12 injections monthly at home  Fatigue: Due to anemia as well as probably underlying depression She does not need Aranesp injections at this time.  We will consider this if her anemia continues to get worse.  We can see her every 3 months with labs and follow-up.

## 2018-09-17 ENCOUNTER — Telehealth: Payer: Self-pay | Admitting: Hematology and Oncology

## 2018-09-17 NOTE — Telephone Encounter (Signed)
Contacted patient's daughter regarding upcoming Webex appointment per patient's request. Patient's daughter is notified and e-mail has been sent.

## 2018-09-20 NOTE — Progress Notes (Addendum)
HEMATOLOGY-ONCOLOGY [place] VISIT PROGRESS NOTE  I connected with [name] on 09/21/2018 at  3:15 PM EDT by Webex video conference and verified that I am speaking with the correct person using two identifiers.  I discussed the limitations, risks, security and privacy concerns of performing an evaluation and management service by Webex and the availability of in person appointments.  I also discussed with the patient that there may be a patient responsible charge related to this service. The patient expressed understanding and agreed to proceed.  Patient's Location: Home Physician Location: Clinic  CHIEF COMPLIANT: MGUS and anemia of chronic kidney disease   INTERVAL HISTORY: Gloria [name] is a 78 y.o. female with above-mentioned history of MGUS and anemia of chronic kidney disease. Her most recent labs on 09/10/18 showed: Hg 9.5, hematocrit 29.7, ferritin 46, iron saturation 15%, beta-2 microglobulin 5.8.  Recently she has had multiple complications including hospitalization for heart related problems including irregular heart rate.  She had to undergo heart catheterization.  She has recovered from some of these and has been home.  REVIEW OF SYSTEMS:   Constitutional: Denies fevers, chills or abnormal weight loss Eyes: Denies blurriness of vision Ears, nose, mouth, throat, and face: Denies mucositis or sore throat Respiratory: Denies cough, dyspnea or wheezes Cardiovascular: Denies palpitation, chest discomfort Gastrointestinal:  Denies nausea, heartburn or change in bowel habits Skin: Denies abnormal skin rashes Lymphatics: Denies new lymphadenopathy or easy bruising Neurological:Denies numbness, tingling or new weaknesses Behavioral/Psych: Mood is stable, no new changes  Extremities: No lower extremity edema Breast: denies any pain or lumps or nodules in either breasts All other systems were reviewed with the patient and are negative.  Observations/Objective:  There were  no vitals filed for this visit. There is no height or weight on file to calculate BMI.  I have reviewed the data as listed CMP Latest Ref Rng & Units 04/30/2018 03/25/2018 03/04/2018  Glucose 65 - 99 mg/dL 98 95 209(H)  BUN 8 - 27 mg/dL 32(H) 23 50(H)  Creatinine 0.57 - 1.00 mg/dL 1.46(H) 1.33(H) 2.15(H)  Sodium 134 - 144 mmol/L 142 142 138  Potassium 3.5 - 5.2 mmol/L 4.3 4.6 4.9  Chloride 96 - 106 mmol/L 100 101 107  CO2 20 - 29 mmol/L 25 26 20(L)  Calcium 8.7 - 10.3 mg/dL 9.3 9.0 8.6(L)  Total Protein 6.5 - 8.1 g/dL - - -  Total Bilirubin 0.3 - 1.2 mg/dL - - -  Alkaline Phos 38 - 126 U/L - - -  AST 15 - 41 U/L - - -  ALT 0 - 44 U/L - - -    Lab Results  Component Value Date   WBC 11.1 (H) 05/17/2018   HGB 10.0 (L) 05/17/2018   HCT 33.9 (L) 05/17/2018   MCV 89.7 05/17/2018   PLT 244 05/17/2018   NEUTROABS 7.4 05/17/2018      Assessment Plan:  MGUS (monoclonal gammopathy of unknown significance) Lab review of7/05/2017: Hemoglobin 10.7, MCV 92 previously. B12 95 [id number]: M protein: 0.6g IgM lambda 02/15/2018: M protein: 0.3 g, IgM lambda, lambda: 31.6, kappa: 57.9, ratio 1.83, creatinine 1.6, hemoglobin 10 05/17/2018: Hemoglobin 10 09/10/2018: Hemoglobin 9.5, MCV 87, ferritin 46, beta-2 microglobulin 5.8 SPEP M protein 0.5 g, IgM 957, kappa 62, lambda 42, ratio 1.46, iron saturation 15%, ferritin 46, beta-2 microglobulin 5.8   Current treatment: B12 injections monthly at home  Fatigue: Due to anemia as well as probably underlying depression She does not need Aranesp injections at this time.  I suspect that the current anemia is related to her chronic disease and once her symptoms with related to her heart improves then the anemia would also likely improved.  We can see her every 6 months with labs and follow-up.    I discussed the assessment and treatment plan with the patient. The patient was provided an opportunity to ask questions and all were answered. The patient  agreed with the plan and demonstrated an understanding of the instructions. The patient was advised to call back or seek an in-person evaluation if the symptoms worsen or if the condition fails to improve as anticipated.   I provided 15 minutes of face-to-face Web Ex time during this encounter.    Rulon Eisenmenger, MD 09/21/2018   I, Molly Dorshimer, am acting as scribe for Nicholas Lose, MD.  I have reviewed the above documentation for accuracy and completeness, and I agree with the above.

## 2018-09-21 ENCOUNTER — Inpatient Hospital Stay: Payer: Medicare Other | Attending: Hematology and Oncology | Admitting: Hematology and Oncology

## 2018-09-21 DIAGNOSIS — D631 Anemia in chronic kidney disease: Secondary | ICD-10-CM | POA: Diagnosis not present

## 2018-09-21 DIAGNOSIS — E538 Deficiency of other specified B group vitamins: Secondary | ICD-10-CM | POA: Diagnosis not present

## 2018-09-21 DIAGNOSIS — D472 Monoclonal gammopathy: Secondary | ICD-10-CM

## 2018-09-21 DIAGNOSIS — N189 Chronic kidney disease, unspecified: Secondary | ICD-10-CM | POA: Diagnosis not present

## 2018-09-22 ENCOUNTER — Telehealth: Payer: Self-pay | Admitting: Hematology and Oncology

## 2018-09-22 NOTE — Telephone Encounter (Signed)
Called regarding schedule °

## 2018-09-24 ENCOUNTER — Other Ambulatory Visit: Payer: Self-pay | Admitting: *Deleted

## 2018-09-24 MED ORDER — "SYRINGE/NEEDLE (DISP) 25G X 5/8"" 3 ML MISC": 1.0000 mL | 0 refills | Status: DC

## 2018-09-27 ENCOUNTER — Other Ambulatory Visit: Payer: Self-pay

## 2018-09-27 MED ORDER — "SYRINGE/NEEDLE (DISP) 25G X 5/8"" 3 ML MISC": 1.0000 mL | 0 refills | Status: DC

## 2018-10-12 IMAGING — MR MR LUMBAR SPINE WO/W CM
4 of 7 series · 17 of 48 positions shown · IV contrast (multihance)
Comparison: MRI of the lumbar spine 07/04/16. MRI lumbar spine
01/10/2014.

CLINICAL DATA: Right foot drop. Previous surgeries. Weakness and
numbness in the right foot.

Creatinine was obtained on site at [HOSPITAL] at [HOSPITAL].
Results: Creatinine 1.2 mg/dL.
EXAM:
MRI LUMBAR SPINE WITHOUT AND WITH CONTRAST
TECHNIQUE: Multiplanar and multiecho pulse sequences of the lumbar spine were
obtained without and with intravenous contrast.
CONTRAST:  10mL MULTIHANCE GADOBENATE DIMEGLUMINE 529 MG/ML IV SOLN

[Series 5: T1 · sagittal · 4.0mm · 0.73mm/px · 3 of 15 slices shown]
[im 1/15]
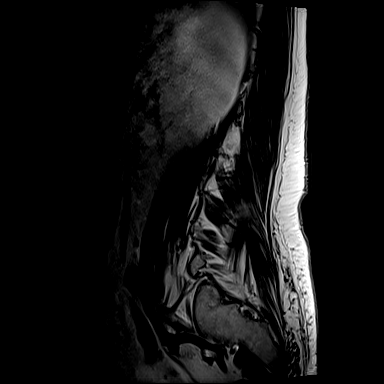
[im 8/15]
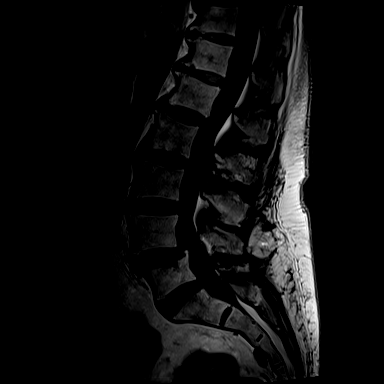
[im 15/15]
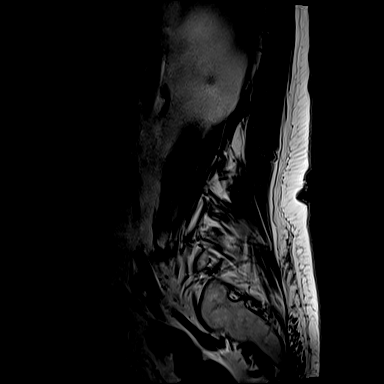

[Series 9: T2 · axial · 4.0mm · 0.28mm/px · z∈[-117,+84]mm · 8 of 40 slices shown (1 of 2)]
[im 1/40]
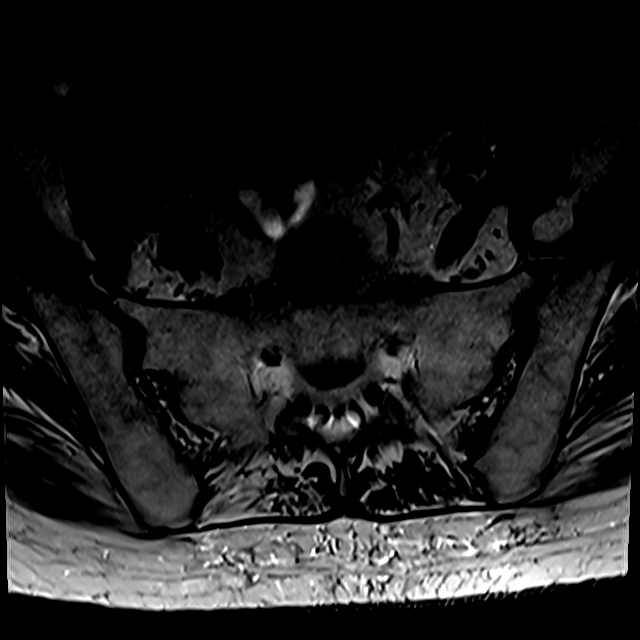
[im 4/40]
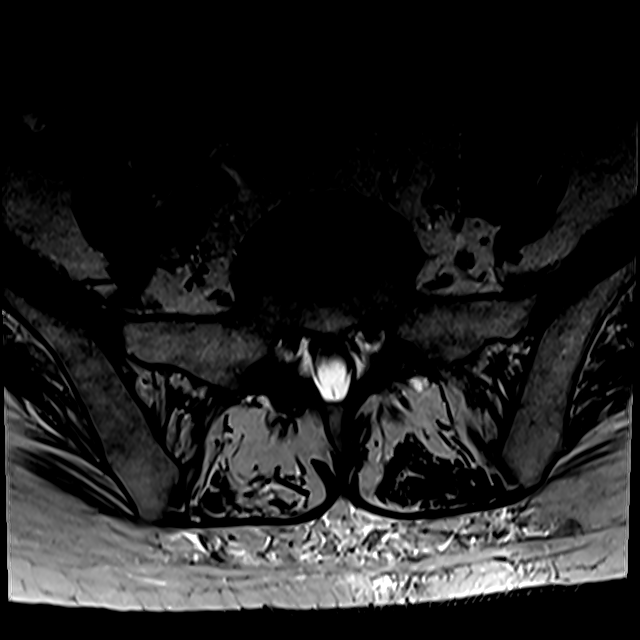
[im 8/40]
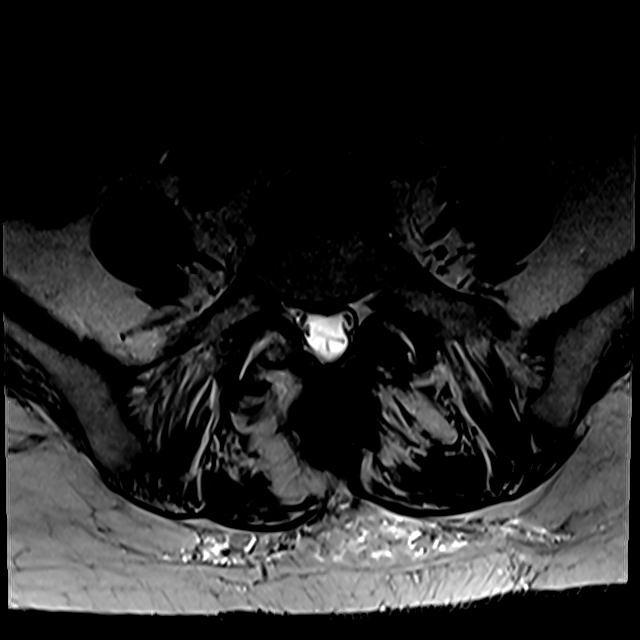
[im 12/40]
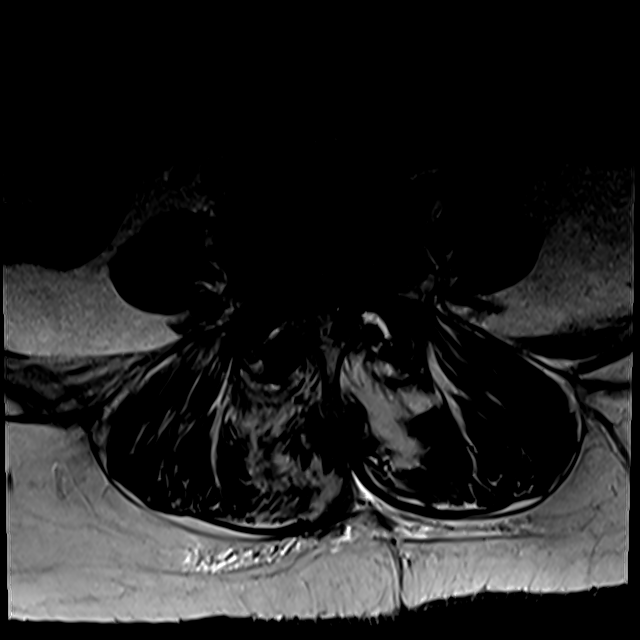
[im 16/40]
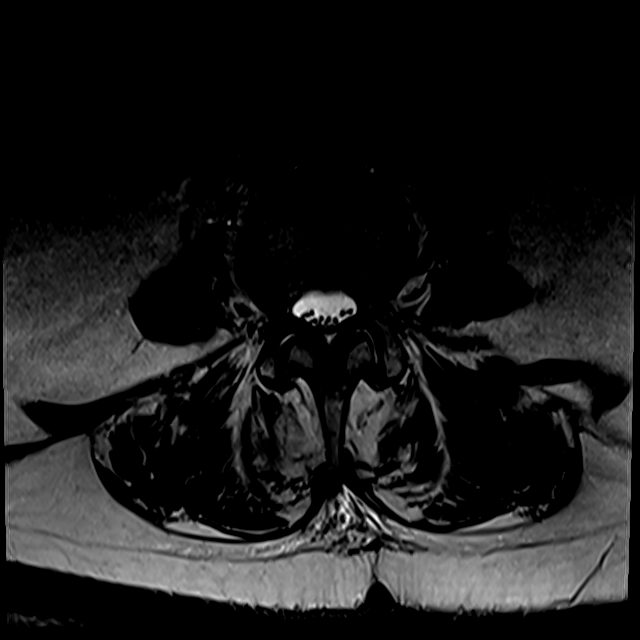
[im 20/40]
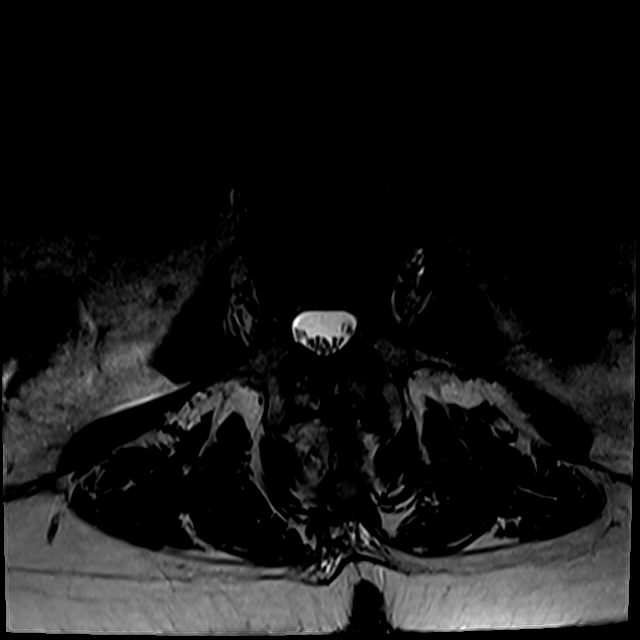
[im 24/40]
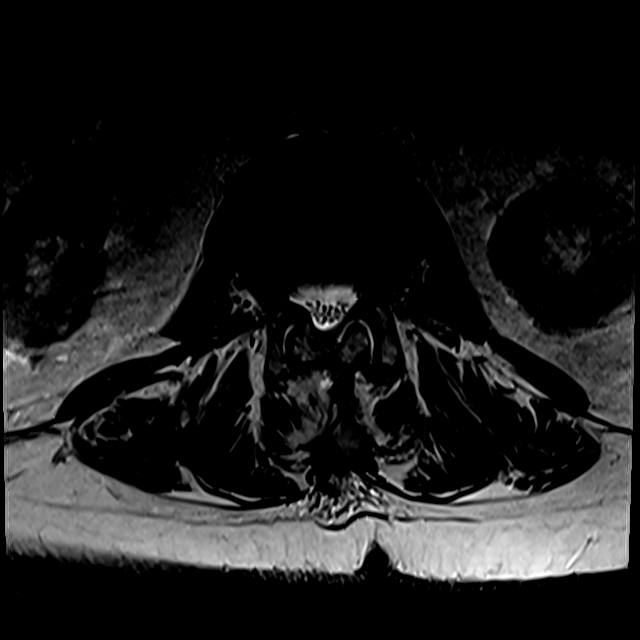
[im 36/40]
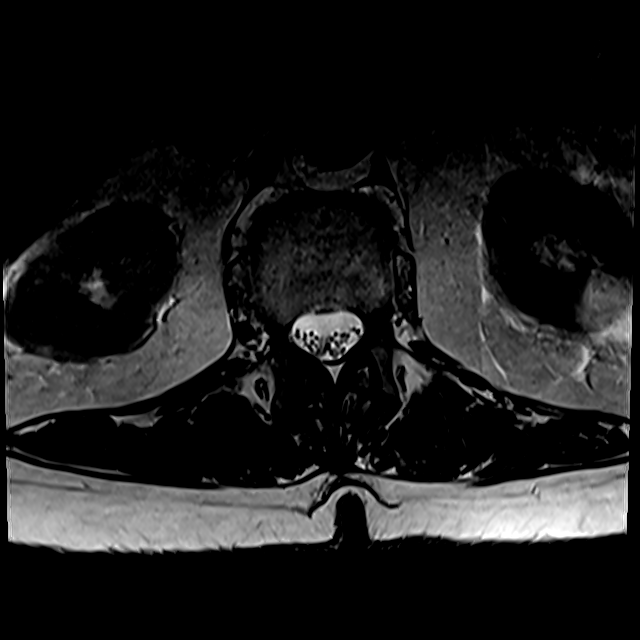

[Series 12: T2 · sagittal · 4.0mm · 0.73mm/px · 3 of 15 slices shown (2 of 2)]
[im 1/15]
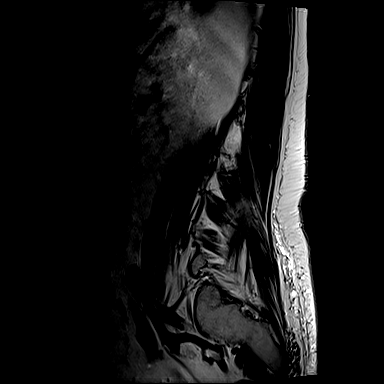
[im 10/15]
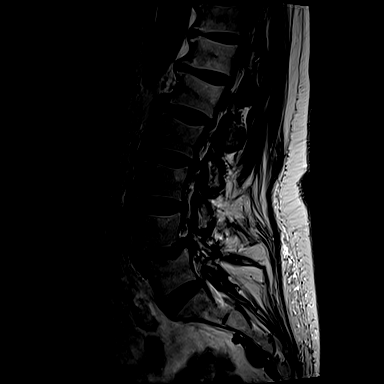
[im 15/15]
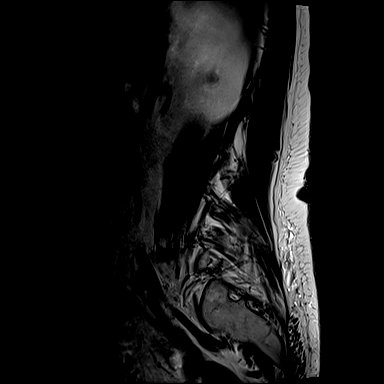

[Series 13: T1 fat-sat post-contrast · sagittal · 4.0mm · 0.73mm/px · 3 of 15 slices shown]
[im 1/15]
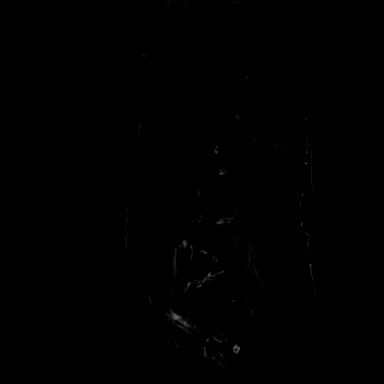
[im 10/15]
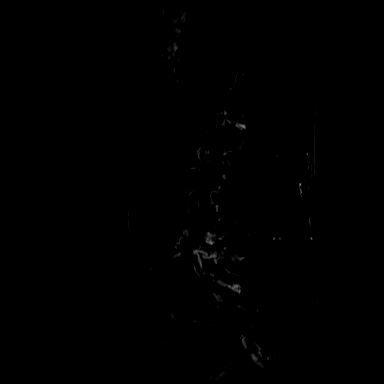
[im 15/15]
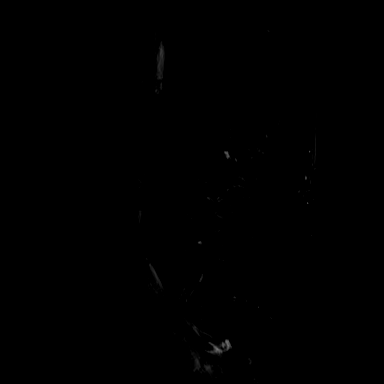

[17 of 48 positions shown; findings below may reference images not displayed]

FINDINGS: Segmentation: 5 non rib-bearing lumbar type vertebral bodies are
present.

Alignment: Grade 1 anterolisthesis demonstrates slight progression
from 7 mm to 8 mm. In 7954 the anterolisthesis was 5 mm. Slight
retrolisthesis at L1-2 is stable.

Vertebrae: Anterior endplate changes are again noted at T11-12 and
T12-L1. Superior endplate Schmorl's node at L5 is stable. Marrow
signal and vertebral body heights are otherwise normal.

Conus medullaris and cauda equina: Conus extends to the T12-L1
level. Conus and cauda equina appear normal.

Paraspinal and other soft tissues: Atherosclerotic disease in the
abdominal aorta has progressed. There is no aneurysm.

Left renal cyst is stable. Kidneys are otherwise unremarkable.
Limited imaging the abdomen is otherwise unremarkable. There is no
significant adenopathy.

Disc levels:

T12-L1: A broad-based disc bulge is stable. There is no significant
stenosis or change.

L1-2: A leftward disc protrusion is present. Moderate facet
hypertrophy is noted. Mild subarticular narrowing is stable, left
greater than right. Mild foraminal narrowing is stable.

L2-3: A broad-based disc protrusion is present. Facet hypertrophy is
again seen. Mild subarticular narrowing worse on the right, stable.
Mild foraminal narrowing is stable.

L3-4: A broad-based disc protrusion is asymmetric on the left.
Moderate facet hypertrophy is noted. Mild subarticular narrowing is
worse on the left. Mild foraminal narrowing is stable bilaterally.

L4-5: Advanced facet hypertrophy is present. The anterolisthesis
results in uncovering of a broad-based disc protrusion. Severe
central canal stenosis has progressed. A synovial cyst from the
right sided facet joint contributes to the stenosis. Right
laminectomy is noted. Severe subarticular stenosis is evident
bilaterally. Severe foraminal stenosis has progressed as well.

L5-S1: Advanced facet hypertrophy is present. A shallow central disc
protrusion is present. Moderate facet hypertrophy is worse on the
right. There is no significant stenosis.
IMPRESSION: 1. Progressive disease at L4-5 with progression of severe central
and bilateral foraminal narrowing despite prior surgery.
2. A synovial cyst on the right at L4-5 is new, now contributing to
the severe stenosis.
3. Multilevel disease elsewhere in the lumbar spine is stable.
4. Mild subarticular and foraminal narrowing bilaterally at L1-2 and
L2-3 is stable.
5. Stable shallow central disc protrusion and moderate facet
hypertrophy at L5-S1 without significant stenosis.

## 2018-12-19 ENCOUNTER — Other Ambulatory Visit (HOSPITAL_COMMUNITY): Payer: Self-pay | Admitting: Internal Medicine

## 2018-12-20 NOTE — Telephone Encounter (Signed)
Pt last saw Vin [place], PA 03/25/18, last labs 04/30/18 Creat 1.46, age 78, weight 77.1kg, based on specified criteria pt is on appropriate dosage of Eliquis 5mg  BID.  Will refill rx.

## 2019-01-19 ENCOUNTER — Other Ambulatory Visit: Payer: Self-pay | Admitting: Internal Medicine

## 2019-01-19 DIAGNOSIS — Z8781 Personal history of (healed) traumatic fracture: Secondary | ICD-10-CM

## 2019-03-07 ENCOUNTER — Other Ambulatory Visit: Payer: Self-pay

## 2019-03-07 ENCOUNTER — Ambulatory Visit
Admission: RE | Admit: 2019-03-07 | Discharge: 2019-03-07 | Disposition: A | Discharge status: Home / Self Care | Payer: Medicare Other | Source: Ambulatory Visit | Attending: Internal Medicine | Admitting: Internal Medicine

## 2019-03-07 DIAGNOSIS — Z8781 Personal history of (healed) traumatic fracture: Secondary | ICD-10-CM

## 2019-03-11 ENCOUNTER — Telehealth: Payer: Self-pay

## 2019-03-11 NOTE — Telephone Encounter (Signed)
Pt's daughter, [name], requesting pt upcoming labs be sent to [facility] in [place] [phone]).  Lab orders printed off for CBC with Differential, Ferritin, Iron and TIBC, Beta 2 Microglobulin serum, Kappa/Lambda Light Chains, and Multiple Myeloma Panel and faxed to [facility] [phone]).  Pt's daughter aware to have labs drawn by 10/29 to have for upcoming follow up.

## 2019-03-17 ENCOUNTER — Other Ambulatory Visit: Payer: Medicare Other

## 2019-03-18 ENCOUNTER — Encounter: Payer: Self-pay | Admitting: Hematology and Oncology

## 2019-03-23 NOTE — Progress Notes (Signed)
[facility] video call: I verified the patient information with 2 identifiers.  Patient Care Team: Tisovec, Fransico Him, MD as PCP - General (Internal Medicine) Fay Records, MD as PCP - Cardiology (Cardiology) Marchia Bond, MD as Consulting Physician (Orthopedic Surgery)  DIAGNOSIS:    ICD-10-CM   1. 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 with above-mentioned history of MGUS and anemia of chronic kidney disease.  She presents to the clinic today for follow-up through [facility] video call.  She reports to be doing quite well.  She is currently taking B12 injections.  REVIEW OF SYSTEMS:   Constitutional: Denies fevers, chills or abnormal weight loss Eyes: Denies blurriness of vision Ears, nose, mouth, throat, and face: Denies mucositis or sore throat Respiratory: Denies cough, dyspnea or wheezes Cardiovascular: Denies palpitation, chest discomfort Gastrointestinal: Denies nausea, heartburn or change in bowel habits Skin: Denies abnormal skin rashes Lymphatics: Denies new lymphadenopathy or easy bruising Neurological: Denies numbness, tingling or new weaknesses Behavioral/Psych: Mood is stable, no new changes  Extremities: No lower extremity edema Breast: denies any pain or lumps or nodules in either breasts All other systems were reviewed with the patient and are negative.  I have reviewed the past medical history, past surgical history, social history and family history with the patient and they are unchanged from previous note.  ALLERGIES:  is allergic to iodinated diagnostic agents; iohexol; codeine; oxycodone; levaquin [levofloxacin in d5w]; and propoxyphene n-acetaminophen.  MEDICATIONS:  Current Outpatient Medications  Medication Sig Dispense Refill  . amiodarone (PACERONE) 200 MG tablet TAKE 1 TABLET BY MOUTH  EVERY DAY 90 tablet 3  . atorvastatin (LIPITOR) 80 MG tablet Take 80 mg by mouth every morning.     . carboxymethylcellulose (REFRESH PLUS) 0.5 % SOLN Place 1 drop into both eyes 3 (three) times daily as needed (for dry/irritated eyes.).     Marland Kitchen diphenhydrAMINE (BENADRYL) 25 MG tablet Take 25 mg by mouth at bedtime.     Marland Kitchen ELIQUIS 5 MG TABS tablet TAKE 1 TABLET BY MOUTH TWICE A DAY 60 tablet 5  . fenofibrate 160 MG tablet Take 160 mg by mouth every morning.     . furosemide (LASIX) 40 MG tablet Take 1 tablet (40 mg total) by mouth as needed. 90 tablet 3  . hydrochlorothiazide (HYDRODIURIL) 25 MG tablet Take 1 tablet (25 mg total) by mouth 2 (two) times daily. 180 tablet 3  . HYDROcodone-acetaminophen (NORCO) 5-325 MG tablet Take 1-2 tablets by mouth every 6 (six) hours as needed for moderate pain. MAXIMUM TOTAL ACETAMINOPHEN DOSE IS 4000 MG PER DAY 30 tablet 0  . JANUVIA 50 MG tablet Take 50 mg by mouth every morning.   1  . LANTUS SOLOSTAR 100 UNIT/ML Solostar Pen Inject 45 Units into the skin at bedtime. Per sliding scale    . lidocaine (LIDODERM) 5 % Place 1 patch onto the skin daily as needed (for pain). Remove & Discard patch within 12 hours or as directed by MD    . Menthol, Topical Analgesic, (BIOFREEZE EX) Apply 1 application topically 2 (two) times daily as needed (for pain).     . metoprolol tartrate (LOPRESSOR) 25 MG tablet Take 0.5 tablets (12.5 mg total) by mouth 2 (two) times daily. 90 tablet 3  . metroNIDAZOLE (FLAGYL) 500 MG tablet Take 500 mg by mouth 2 (two)  times daily.    . Multiple Vitamins-Minerals (PRESERVISION AREDS 2 PO) Take 1 capsule by mouth 2 (two) times daily.    . Needles & Syringes MISC 1 Units by Does not apply route every 30 (thirty) days.  0  . omeprazole (PRILOSEC) 20 MG capsule Take 20 mg by mouth daily before breakfast.     . potassium chloride SA (KLOR-CON M20) 20 MEQ tablet Take 1 tablet (20 mEq total) by mouth as needed. 90 tablet 3  . promethazine (PHENERGAN)  12.5 MG tablet Take 1 tablet (12.5 mg total) by mouth every 6 (six) hours as needed for nausea or vomiting. 30 tablet 0  . sennosides-docusate sodium (SENOKOT-S) 8.6-50 MG tablet Take 2 tablets by mouth daily. 30 tablet 1  . SYRINGE-NEEDLE, DISP, 3 ML (B-D 3CC LUER-LOK SYR 25GX5/8") 25G X 5/8" 3 ML MISC Inject 1 mL into the muscle every 30 (thirty) days. 3 each 0   No current facility-administered medications for this visit.     PHYSICAL EXAMINATION: ECOG PERFORMANCE STATUS: 1 - Symptomatic but completely ambulatory  There were no vitals filed for this visit. There were no vitals filed for this visit.  GENERAL: alert, no distress and comfortable SKIN: skin color, texture, turgor are normal, no rashes or significant lesions EYES: normal, Conjunctiva are pink and non-injected, sclera clear OROPHARYNX: no exudate, no erythema and lips, buccal mucosa, and tongue normal  NECK: supple, thyroid normal size, non-tender, without nodularity LYMPH: no palpable lymphadenopathy in the cervical, axillary or inguinal LUNGS: clear to auscultation and percussion with normal breathing effort HEART: regular rate & rhythm and no murmurs and no lower extremity edema ABDOMEN: abdomen soft, non-tender and normal bowel sounds MUSCULOSKELETAL: no cyanosis of digits and no clubbing  NEURO: alert & oriented x 3 with fluent speech, no focal motor/sensory deficits EXTREMITIES: No lower extremity edema  LABORATORY DATA:  I have reviewed the data as listed CMP Latest Ref Rng & Units 04/30/2018 03/25/2018 03/04/2018  Glucose 65 - 99 mg/dL 98 95 209(H)  BUN 8 - 27 mg/dL 32(H) 23 50(H)  Creatinine 0.57 - 1.00 mg/dL 1.46(H) 1.33(H) 2.15(H)  Sodium 134 - 144 mmol/L 142 142 138  Potassium 3.5 - 5.2 mmol/L 4.3 4.6 4.9  Chloride 96 - 106 mmol/L 100 101 107  CO2 20 - 29 mmol/L 25 26 20(L)  Calcium 8.7 - 10.3 mg/dL 9.3 9.0 8.6(L)  Total Protein 6.5 - 8.1 g/dL - - -  Total Bilirubin 0.3 - 1.2 mg/dL - - -  Alkaline Phos  38 - 126 U/L - - -  AST 15 - 41 U/L - - -  ALT 0 - 44 U/L - - -    Lab Results  Component Value Date   WBC 11.1 (H) 05/17/2018   HGB 10.0 (L) 05/17/2018   HCT 33.9 (L) 05/17/2018   MCV 89.7 05/17/2018   PLT 244 05/17/2018   NEUTROABS 7.4 05/17/2018    ASSESSMENT & PLAN:  MGUS (monoclonal gammopathy of unknown significance) Lab review of7/05/2017: Hemoglobin 10.7, MCV 92 previously. B12 95 [id number]: M protein: 0.6g IgM lambda 02/15/2018: M protein: 0.3 g, IgM lambda,lambda: 31.6, kappa: 57.9, ratio 1.83, creatinine 1.6, hemoglobin 10 05/17/2018: Hemoglobin 10 09/10/2018: Hemoglobin 9.5, MCV 87, ferritin 46, beta-2 microglobulin 5.8 SPEP M protein 0.5 g, IgM 957, kappa 62, lambda 42, ratio 1.46, iron saturation 15%, ferritin 46, beta-2 microglobulin 5.8  Current treatment: B12 injections monthly at home  Lab review done at [facility] 03/18/2019: WBC 12.3, hemoglobin 11, MCV 83,  ANC 9.4, creatinine 1.58, M spike 0.4 g (previously was 0.5 g) IgM 944 (previously was 957), beta-2 microglobulin 4.9  Lab discussion: Patient had an excellent response to B12 injections with improvement of hemoglobin.  She probably has mild degree of anemia of chronic kidney disease but it is not low enough to consider Aranesp treatments.  Continue with monthly B12 injections at home. Severe fatigue: I increase the B12 injections to every 2 weeks. I sent a new prescription today. MGUS has remained stable.  Return to clinic in 6 months with labs and follow-up.  Labs to be done at [facility].    No orders of the defined types were placed in this encounter.  The patient has a good understanding of the overall plan. she agrees with it. she will call with any problems that may develop before the next visit here.  Nicholas Lose, MD 03/24/2019  Julious Oka Dorshimer am acting as scribe for Dr. Nicholas Lose.  I have reviewed the above documentation for accuracy and completeness, and I agree with the above.

## 2019-03-24 ENCOUNTER — Other Ambulatory Visit: Payer: Self-pay

## 2019-03-24 ENCOUNTER — Inpatient Hospital Stay: Payer: Medicare Other | Attending: Hematology and Oncology | Admitting: Hematology and Oncology

## 2019-03-24 DIAGNOSIS — D472 Monoclonal gammopathy: Secondary | ICD-10-CM

## 2019-03-24 DIAGNOSIS — D649 Anemia, unspecified: Secondary | ICD-10-CM

## 2019-03-24 MED ORDER — CYANOCOBALAMIN 1000 MCG/ML IJ SOLN: 1000.0000 ug | INTRAMUSCULAR | 3 refills | Status: AC

## 2019-03-24 NOTE — Assessment & Plan Note (Addendum)
Lab review of7/05/2017: Hemoglobin 10.7, MCV 92 previously. B12 95 [id number]: M protein: 0.6g IgM lambda 02/15/2018: M protein: 0.3 g, IgM lambda,lambda: 31.6, kappa: 57.9, ratio 1.83, creatinine 1.6, hemoglobin 10 05/17/2018: Hemoglobin 10 09/10/2018: Hemoglobin 9.5, MCV 87, ferritin 46, beta-2 microglobulin 5.8 SPEP M protein 0.5 g, IgM 957, kappa 62, lambda 42, ratio 1.46, iron saturation 15%, ferritin 46, beta-2 microglobulin 5.8  Current treatment: B12 injections monthly at home  Lab review done at [facility] 03/18/2019: WBC 12.3, hemoglobin 11, MCV 83, ANC 9.4, creatinine 1.58, M spike 0.4 g (previously was 0.5 g) IgM 944 (previously was 957), beta-2 microglobulin 4.9  Lab discussion: Patient had an excellent response to B12 injections with improvement of hemoglobin.  She probably has mild degree of anemia of chronic kidney disease but it is not low enough to consider Aranesp treatments.  Continue with monthly B12 injections at home. MGUS has remained stable.  Return to clinic in 1 year with labs and follow-up

## 2019-03-24 NOTE — Progress Notes (Signed)
Orders for SPEP with IFE, Beta 2 microglobulin, TSH, LDH, CMP, and CBC with Diff faxed successfully to [facility] in [place] [phone] for repeat labs in 6 months.     Pt's daughter, [name], notified and given copies of orders.

## 2019-04-07 ENCOUNTER — Other Ambulatory Visit: Payer: Self-pay | Admitting: Hematology and Oncology

## 2019-06-03 IMAGING — DX DG KNEE 1-2V PORT*R*
2 series · 2 of 2 positions shown · non-contrast
Comparison: None.

CLINICAL DATA: Status post right total knee arthroplasty.

EXAM:
PORTABLE RIGHT KNEE - 1-2 VIEW

[knee ap]
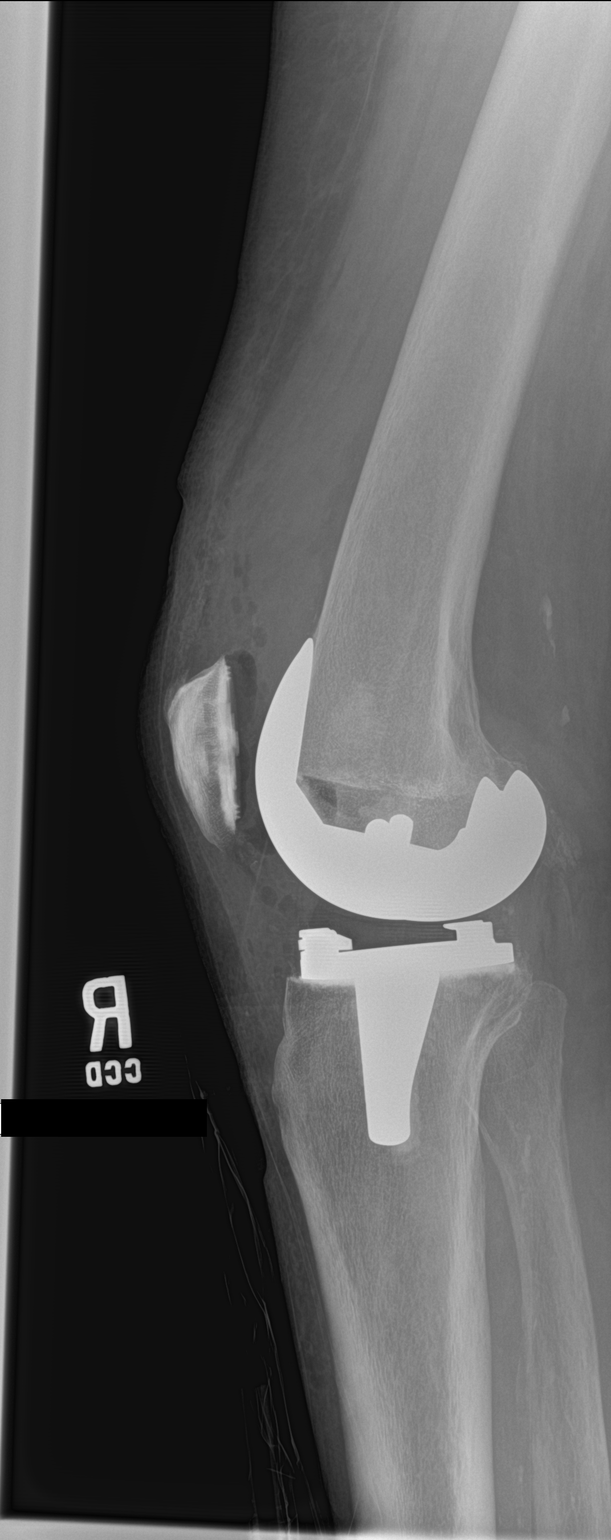

[knee lat]
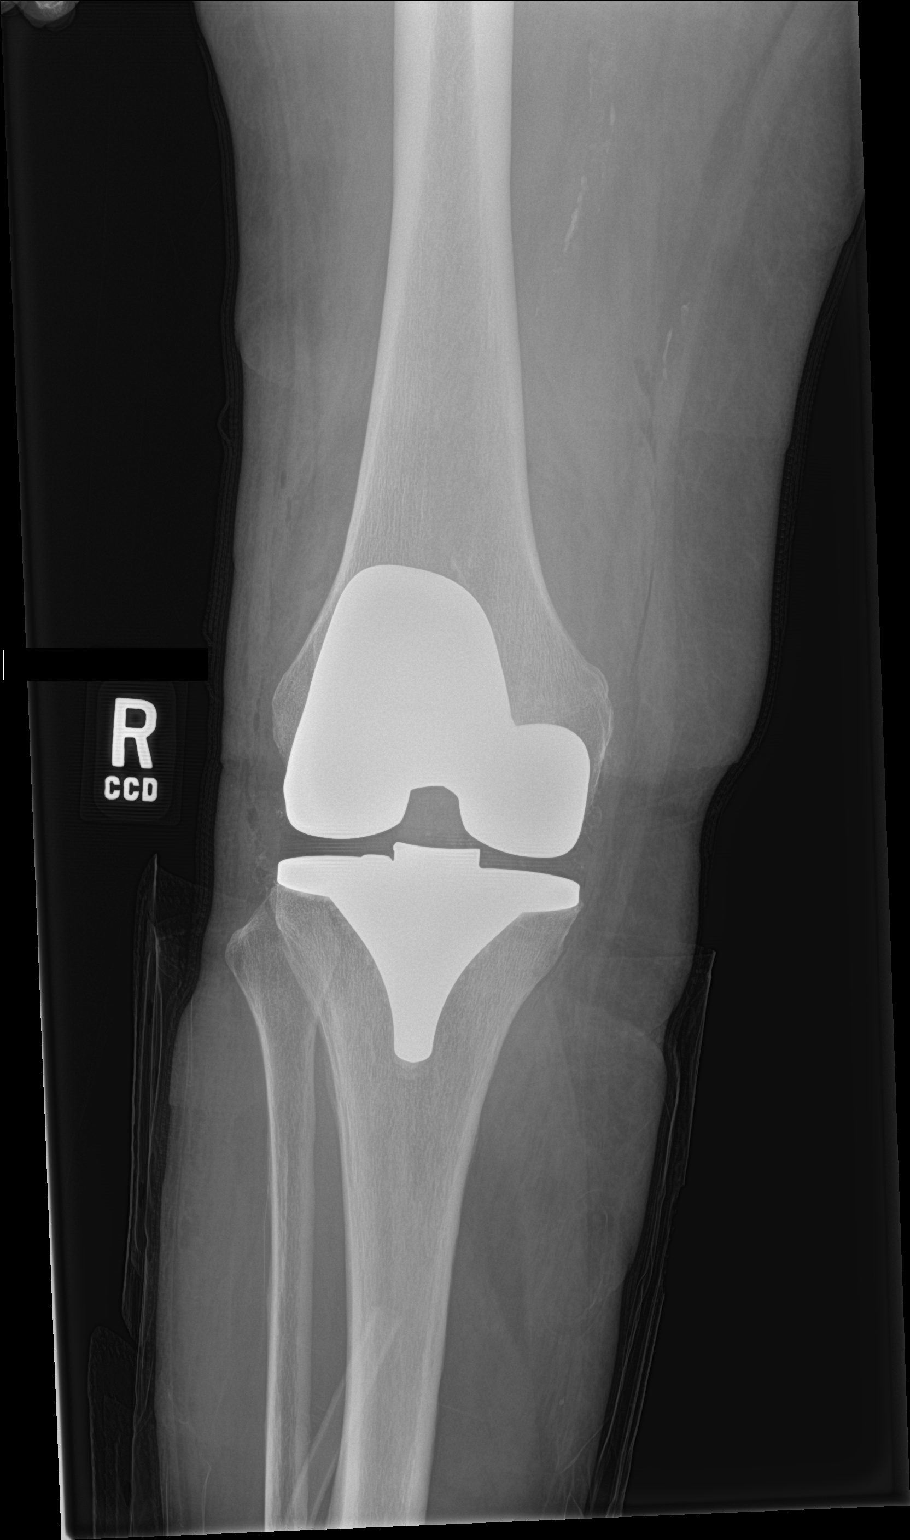

[2 of 2 positions shown; findings below may reference images not displayed]

FINDINGS: The patient has undergone right total knee arthroplasty.
Radiographic positioning of the prosthetic components is good. The
interface with the native bone is normal. There is no acute native
bone abnormality.
IMPRESSION: There is no immediate postprocedure complication following right
total knee arthroplasty.

## 2019-06-09 ENCOUNTER — Other Ambulatory Visit (HOSPITAL_COMMUNITY): Payer: Self-pay | Admitting: Internal Medicine

## 2019-06-09 NOTE — Telephone Encounter (Addendum)
Eliquis 5mg  refill request received, pt is 79yrs old, weight-77.1kg, Crea-1.58 on 03/18/2019 via [facility] at [place], [place], and last seen by Vin[facility]PA on 03/25/2018 and per records pt has been following at other facilities-[facility]. Will call to the pt to inquire since she has not been seen here in over a year.  Called pt and she states that she is going somewhere else and please do not fill cause she will call the correct Physician. Advised I will deny this and she will call the pharmacy to send to correct place.

## 2019-08-25 ENCOUNTER — Telehealth (INDEPENDENT_AMBULATORY_CARE_PROVIDER_SITE_OTHER): Payer: Self-pay

## 2019-08-25 NOTE — Telephone Encounter (Signed)
[name], pts daughter states Pt had stroke in [place]. Pt was seen by Dr. Posey Pronto on Saturday and immediately sent for a stroke workup at [facility]. Pt is currently in hospital waiting for a Temporal Artery Biopsy, which possibly cause the occulusion. Pt states this AM she started to see a veil/"snake skin" that comes and goes and moves across vision.   Per [name], RN, Pt needs to been seen by Dr. Zadie Rhine and [name] can be put on our schedule the same day she gets out of the hospital. This was communicated to [name] and she will call when she knows [name]'s release date.

## 2019-08-30 ENCOUNTER — Encounter (INDEPENDENT_AMBULATORY_CARE_PROVIDER_SITE_OTHER): Payer: Medicare Other | Admitting: Ophthalmology

## 2019-09-01 ENCOUNTER — Other Ambulatory Visit: Payer: Self-pay

## 2019-09-01 ENCOUNTER — Encounter (INDEPENDENT_AMBULATORY_CARE_PROVIDER_SITE_OTHER): Payer: Self-pay | Admitting: Ophthalmology

## 2019-09-01 ENCOUNTER — Ambulatory Visit (INDEPENDENT_AMBULATORY_CARE_PROVIDER_SITE_OTHER): Payer: Medicare Other | Admitting: Ophthalmology

## 2019-09-01 DIAGNOSIS — H34232 Retinal artery branch occlusion, left eye: Secondary | ICD-10-CM | POA: Insufficient documentation

## 2019-09-01 DIAGNOSIS — H353212 Exudative age-related macular degeneration, right eye, with inactive choroidal neovascularization: Secondary | ICD-10-CM | POA: Diagnosis not present

## 2019-09-01 DIAGNOSIS — E119 Type 2 diabetes mellitus without complications: Secondary | ICD-10-CM | POA: Diagnosis not present

## 2019-09-01 DIAGNOSIS — H34212 Partial retinal artery occlusion, left eye: Secondary | ICD-10-CM | POA: Diagnosis not present

## 2019-09-01 DIAGNOSIS — Z9889 Other specified postprocedural states: Secondary | ICD-10-CM | POA: Diagnosis not present

## 2019-09-01 DIAGNOSIS — Z8669 Personal history of other diseases of the nervous system and sense organs: Secondary | ICD-10-CM

## 2019-09-01 NOTE — Patient Instructions (Signed)
Central Retinal Artery Occlusion, Adult  Central retinal artery occlusion (CRAO) is a blockage in the main blood vessel that carries blood to the retina (central retinal artery). The retina is the part of your eye that senses light and sends signals to the brain that allow you to see. CRAO can make you lose some or all of your vision. If the condition is not treated within 90 minutes, it can result in permanent vision loss in the affected eye. What are the causes? This condition may be caused by:  A blood clot that forms in the artery (thrombus). A clot is blood that has thickened into a gel or solid.  A collection of blood or solid material, such as fat that forms somewhere else and travels to the artery (embolus).  A disease that causes the artery to swell (vasculitis).  An infection of the heart's inner lining (endocarditis).  An injury to the artery. Sometimes the cause is not known. What increases the risk? This condition is more likely to develop in:  Males.  Adults who are age 37 or older.  Women who take birth control pills.  Pregnant women.  People who have certain medical conditions, including: ? A blood vessel disease that causes narrowing of the arteries (atherosclerosis). ? High levels of fat in the blood (hyperlipidemia). ? Giant cell arteritis. ? Diabetes. ? Heart disease. ? Sickle cell disease. ? High blood pressure (hypertension). ? A heart rhythm problem (atrial fibrillation). ? A heart valve condition. ? A blood-clotting disorder (coagulopathy). ? Inflammation of blood vessels (vasculitis). What are the signs or symptoms? The only symptom of this condition is sudden and painless vision loss. How is this diagnosed? This condition is usually diagnosed by a health care provider who specializes in eye diseases (ophthalmologist). To diagnose the condition, the ophthalmologist will do an eye exam. She or he may also:  Do an exam that involves having eye drops  put in the eye (slit lamp exam or dilated fundus exam).  Order tests, including: ? A vision test. ? A measurement of the pressure inside your eye. ? A test that checks the nerves in your retina (electroretinogram). ? A fluorescein angiogram. In this test, pictures are taken of your retina after a dye is injected into your bloodstream. ? Optical coherence tomography. In this test, light waves are used to create pictures of your retina.  Check your blood pressure.  Order other tests to find the cause of the condition. These tests may include: ? Blood tests to check for vasculitis or coagulopathy. ? An ultrasound to check for heart disease or atherosclerosis. How is this treated? Treatment for this condition may include:  Massaging the eye.  Medicines, such as steroids to treat any underlying inflammation.  Breathing in a mixture of oxygen and carbon dioxide. This widens (dilates) the arteries of your retina.  Having fluid removed from the front of your eye.  Using drops to reduce pressure in your eye.  An injection of a clot-busting medicine. You may be referred to a specialist or receive additional treatment if any of these apply:  Your condition was caused by a blood clot that formed outside your eye.  Your optic nerve is swollen.  You have symptoms of a condition called giant cell arteritis. Symptoms of this condition include: ? Headache. ? Pain with chewing. ? Scalp tenderness. ? Recent poor appetite and weight loss. Follow these instructions at home:  Use over-the-counter and prescription medicines only as told by your health  care provider. These include any eye drops.  If you lose some vision again: ? Breathe into a paper bag. ? Massage your eye as directed. ? Get medical care right away.  Keep all follow-up visits as told by your health care provider. This is important. How is this prevented? To help prevent this condition:  Work with your health care  providers to reduce your risk factors.  Do not use any products that contain nicotine or tobacco, such as cigarettes and e-cigarettes. If you need help quitting, ask your health care provider.  Maintain a healthy weight.  Follow a heart-healthy diet. This diet includes a lot of fruits, vegetables, grains, and lean protein. It is low in saturated fat and sugar.  Exercise regularly. Contact a health care provider if:  You have any changes in your vision. Get help right away if:  You have eye pain.  You have new or sudden vision loss. Summary  Central retinal artery occlusion (CRAO) is a blockage in the main blood vessel that carries blood to the retina (central retinal artery). The retina is the part of your eye that senses light and sends signals to the brain that allow you to see.  CRAO can make you lose some or all of your vision. If the condition is not treated within 90 minutes, it can result in permanent vision loss in the affected eye.  Treatment for this condition may include medicines, eye massage, eye drops, and certain procedures.  Get help right away if you have new or sudden vision loss. This information is not intended to replace advice given to you by your health care provider. Make sure you discuss any questions you have with your health care provider. Document Revised: 04/17/2017 Document Reviewed: 07/11/2016 Elsevier Patient Education  2020 [employer].

## 2019-09-01 NOTE — Progress Notes (Signed)
09/01/2019     CHIEF COMPLAINT Patient presents for Retina Follow Up   HISTORY OF PRESENT ILLNESS: Gloria [name] is a 79 y.o. female who presents to the clinic today for:   HPI    Retina Follow Up    Patient presents with  Other.  In left eye.  Severity is severe.  Since onset it is stable.  I, the attending physician,  performed the HPI with the patient and updated documentation appropriately.          Comments    Retinal Vein Occulusion OS  Daughter states the occulusion happened on 08/19/19. Pt saw Dr. Posey Pronto and he immediately sent her to the ER to have a stroke workup. Pt states she saw a veil over OS. She now has partal vision. Pt states the upper inner corner of OS vision is blurred. Pt also sees a "snakeskin" go across OS vision. Pt states she has a blood clot in heart. Denies OS pain.  BGL: 475 (on prednisone) A1C: 6.5       Last edited by Tilda Franco on 09/01/2019  1:49 PM. (History)      Referring physician: Haywood Pao, MD [place],  [place] [address]  HISTORICAL INFORMATION:   Selected notes from the MEDICAL RECORD NUMBER    Lab Results  Component Value Date   HGBA1C 7.1 (H) 02/23/2018     CURRENT MEDICATIONS: Current Outpatient Medications (Ophthalmic Drugs)  Medication Sig  . carboxymethylcellulose (REFRESH PLUS) 0.5 % SOLN Place 1 drop into both eyes 3 (three) times daily as needed (for dry/irritated eyes.).    No current facility-administered medications for this visit. (Ophthalmic Drugs)   Current Outpatient Medications (Other)  Medication Sig  . amiodarone (PACERONE) 200 MG tablet TAKE 1 TABLET BY MOUTH EVERY DAY  . atorvastatin (LIPITOR) 80 MG tablet Take 80 mg by mouth every morning.   . B-D 3CC LUER-LOK SYR 25GX5/8" 25G X 5/8" 3 ML MISC INJECT 1 ML INTO THE MUSCLE EVERY 30 (THIRTY) DAYS.  Marland Kitchen Cyanocobalamin (B-12 COMPLIANCE INJECTION) 1000 MCG/ML KIT Inject 1,000 mcg as directed every 14 (fourteen) days.  .  diphenhydrAMINE (BENADRYL) 25 MG tablet Take 25 mg by mouth at bedtime.   Marland Kitchen JANUVIA 50 MG tablet Take 50 mg by mouth every morning.   . lidocaine (LIDODERM) 5 % Place 1 patch onto the skin daily as needed (for pain). Remove & Discard patch within 12 hours or as directed by MD  . Menthol, Topical Analgesic, (BIOFREEZE EX) Apply 1 application topically 2 (two) times daily as needed (for pain).   . metoprolol tartrate (LOPRESSOR) 25 MG tablet Take 0.5 tablets (12.5 mg total) by mouth 2 (two) times daily.  . Multiple Vitamins-Minerals (PRESERVISION AREDS 2 PO) Take 1 capsule by mouth 2 (two) times daily.  . Needles & Syringes MISC 1 Units by Does not apply route every 30 (thirty) days.  Marland Kitchen omeprazole (PRILOSEC) 20 MG capsule Take 20 mg by mouth daily before breakfast.   . ELIQUIS 5 MG TABS tablet TAKE 1 TABLET BY MOUTH TWICE A DAY (Patient not taking: Reported on 09/01/2019)  . fenofibrate 160 MG tablet Take 160 mg by mouth every morning.   . furosemide (LASIX) 40 MG tablet Take 1 tablet (40 mg total) by mouth as needed.  . hydrochlorothiazide (HYDRODIURIL) 25 MG tablet Take 1 tablet (25 mg total) by mouth 2 (two) times daily.  Marland Kitchen HYDROcodone-acetaminophen (NORCO) 5-325 MG tablet Take 1-2 tablets by mouth  every 6 (six) hours as needed for moderate pain. MAXIMUM TOTAL ACETAMINOPHEN DOSE IS 4000 MG PER DAY  . LANTUS SOLOSTAR 100 UNIT/ML Solostar Pen Inject 45 Units into the skin at bedtime. Per sliding scale  . Menthol, Topical Analgesic, 10 % LIQD Apply topically.  . metroNIDAZOLE (FLAGYL) 500 MG tablet Take 500 mg by mouth 2 (two) times daily.  . potassium chloride SA (KLOR-CON M20) 20 MEQ tablet Take 1 tablet (20 mEq total) by mouth as needed.  . promethazine (PHENERGAN) 12.5 MG tablet Take 1 tablet (12.5 mg total) by mouth every 6 (six) hours as needed for nausea or vomiting.  . sennosides-docusate sodium (SENOKOT-S) 8.6-50 MG tablet Take 2 tablets by mouth daily.   No current facility-administered  medications for this visit. (Other)      REVIEW OF SYSTEMS: ROS    Positive for: Endocrine   Last edited by Tilda Franco on 09/01/2019  1:37 PM. (History)       ALLERGIES Allergies  Allergen Reactions  . Iodinated Diagnostic Agents Anaphylaxis  . Iohexol Hives, Swelling and Other (See Comments)     Desc: ANGIO EDEMA W/ ANAPHYLAXIS PRIOR... HIVES POST 13 HR PREP...OK/A.C.   . Codeine Nausea And Vomiting    SEVERE  . Oxycodone Nausea And Vomiting    SEVERE  . Levaquin [Levofloxacin In D5w] Nausea Only and Other (See Comments)    dizziness  . Propoxyphene N-Acetaminophen Nausea And Vomiting    PAST MEDICAL HISTORY Past Medical History:  Diagnosis Date  . Anticoagulant long-term use    eliquis  . B12 deficiency   . Carotid stenosis, bilateral    followed by dr Trula Slade--- per last duplex 08-11-2017  patent right ICA,  left ICA 40-59%,  left CEA >50%  . Chronic anemia   . CKD (chronic kidney disease), stage III   . Diabetic retinopathy ([place])   . Diastolic CHF, chronic ([place])   . Family history of adverse reaction to anesthesia    daughter - violent  . GERD (gastroesophageal reflux disease)   . Hyperlipidemia   . Hypertension   . Macular degeneration    Right eye only,  loss of peripheral vision  . MGUS (monoclonal gammopathy of unknown significance)    followed by dr Lindi Adie ([facility])  . OA (osteoarthritis)    knees, fingers, back  . OSA on CPAP 11/05/2017   Severe sleep apnea during REM sleep with oxygen saturations dropping to 78%.  Due to severe nocturnal hypoxemia as well as significant sleep apnea during REM sleep she is now on CPAP at  11 cm H2O  . PAF (paroxysmal atrial fibrillation) ([place]) 05/2017   [facility] cardiologist-  dr Dorris Carnes  . Peripheral neuropathy    bilateral feet post back surgery  . Peripheral vascular disease (HCC)    hx rt carotid s/p cea  . PONV (postoperative nausea and vomiting)   . Primary localized osteoarthritis of  right knee 03/02/2018  . Type 2 diabetes mellitus treated with insulin ([place])    followed by pcp   Past Surgical History:  Procedure Laterality Date  . CARDIAC CATHETERIZATION  05-27-2005   dr Doreatha Lew   mild to moderate coronary atherosclerosis, 60% ostial narrowing bifurcation OM and 60% D2 w/ minor irregularities otherwise ,  normal LVF  . CARDIOVASCULAR STRESS TEST  02-26-2012    dr Aundra Dubin   normal dobutamin nuclera study w/ no ischemia/  normal LV funciton and wall motion , ef 66%  . CARDIOVERSION N/A 06/07/2017  Procedure: CARDIOVERSION;  Surgeon: Thompson Grayer, MD;  Location: [facility];  Service: Cardiovascular;  Laterality: N/A;  . CAROTID ENDARTERECTOMY Right 06-06-2005   dr Amedeo Plenty '@[facility]'   . CATARACT EXTRACTION Right 08/20/2016   Dr. Katy Fitch  . CATARACT EXTRACTION Left 10/11/2016   Dr. Katy Fitch  . CATARACT EXTRACTION W/ INTRAOCULAR LENS  IMPLANT, BILATERAL  2018  . CHOLECYSTECTOMY OPEN  1968   AND APPENDECTOMY  . EYE SURGERY Right 2017    Retinal tear-   . LUMBAR LAMINECTOMY/DECOMPRESSION MICRODISCECTOMY  02/27/2012   Procedure: LUMBAR LAMINECTOMY/DECOMPRESSION MICRODISCECTOMY 2 LEVELS;  Surgeon: Kristeen Miss, MD;  Location: [facility];  Service: Neurosurgery;  Laterality: N/A;  Lumbar two-three, four-five Laminectomy/foraminotomy  . TONSILLECTOMY  AGE 67  . TOTAL KNEE ARTHROPLASTY Right 03/02/2018   Procedure: RIGHT TOTAL KNEE ARTHROPLASTY;  Surgeon: Marchia Bond, MD;  Location: [facility];  Service: Orthopedics;  Laterality: Right;  . TRANSTHORACIC ECHOCARDIOGRAM  06-02-2017   dr p. ross   mild LVH,  ef 55%, grade 2 diastoic dysfunction/  trivial MR and TR/  moderate LAE/  mild RAE    FAMILY HISTORY Family History  Problem Relation Age of Onset  . Heart disease Mother        Heart Disease before age 72  . Heart disease Father   . Heart disease Sister        Heart Disease before age 41  . Breast cancer Daughter 12    SOCIAL HISTORY Social History   Tobacco Use  . Smoking  status: Former Smoker    Packs/day: 0.25    Years: 32.00    Pack years: 8.00    Types: Cigarettes    Quit date: 02/20/2004    Years since quitting: 15.5  . Smokeless tobacco: Never Used  Substance Use Topics  . Alcohol use: No  . Drug use: Never         OPHTHALMIC EXAM:  Base Eye Exam    Visual Acuity (Snellen - Linear)      Right Left   Dist [place] E Card @ 3' 20/30 -2   Dist ph [place] NI        Tonometry (Tonopen, 1:49 PM)      Right Left   Pressure 12 12       Pupils      Pupils Dark Light Shape React APD   Right PERRL 3 3 Round Minimal None   Left PERRL 3 3 Round Minimal None       Visual Fields (Counting fingers)      Left Right    Full Full       Neuro/Psych    Oriented x3: Yes   Mood/Affect: Normal       Dilation    Both eyes: 1.0% Mydriacyl, 2.5% Phenylephrine @ 1:49 PM        Slit Lamp and Fundus Exam    External Exam      Right Left   External Normal Normal       Slit Lamp Exam      Right Left   Lids/Lashes Normal Normal   Conjunctiva/Sclera White and quiet White and quiet   Cornea Clear Clear   Anterior Chamber Deep and quiet Deep and quiet   Iris Round and reactive Round and reactive   Lens Posterior chamber intraocular lens Posterior chamber intraocular lens   Anterior Vitreous Normal Normal       Fundus Exam      Right Left   Posterior Vitreous Posterior vitreous  detachment    Disc Normal Normal   C/D Ratio 0.3           IMAGING AND PROCEDURES  Imaging and Procedures for 09/01/19  OCT, Retina - OU - Both Eyes       Right Eye Quality was good. Scan locations included subfoveal. Progression has been stable. Findings include abnormal foveal contour.   Left Eye Quality was good. Scan locations included subfoveal. Progression has worsened. Findings include retinal drusen , no IRF, no SRF.   Notes OD with chronic disc for macular scar And no evidence of active macular degeneration at this time.  OS, age-related macular  degeneration, no choroidal neovascularization at this time. Intraretinal opacity along the inferotemporal arcade suggestive of recent BRA O.,  Branch retinal artery occlusion       Color Fundus Photography Optos - OU - Both Eyes       Right Eye Progression has been stable. Disc findings include normal observations. Macula : normal observations. Periphery : normal observations.   Left Eye Progression has worsened. Disc findings include normal observations. Macula : normal observations. Periphery : normal observations.   Notes Calcific intra-arterial Hollenhorst plaque at the nerve affecting the inferior branch retinal artery OS with inferotemporal retinal whitening and inner retinal infarction along inferotemporal arcade  OD, with chronic old unchanged disc a form macular scar                ASSESSMENT/PLAN:  No problem-specific Assessment & Plan notes found for this encounter.      ICD-10-CM   1. Branch retinal artery occlusion of left eye  H34.232 Color Fundus Photography Optos - OU - Both Eyes  2. Exudative age-related macular degeneration of right eye with inactive choroidal neovascularization (HCC)  H35.3212 OCT, Retina - OU - Both Eyes    Color Fundus Photography Optos - OU - Both Eyes  3. History of detached retina repair  Z98.890    Z86.69   4. Diabetes mellitus without complication (HCC)  Z16.9 OCT, Retina - OU - Both Eyes  5. Hollenhorst plaque, left eye  H34.212 Color Fundus Photography Optos - OU - Both Eyes    1.  New branch retinal artery occlusion.  This involves the left eye with a significant visual field loss.  Patient reports not having carotid Doppler evaluation during her recent visit to the emergency room.  2.  I recommend follow-up with CVTS, vascular surgeon to evaluate though carotid Dopplers on each side.  3.  The left eye has clearly suffered a calcific plaque embolus to the left eye  Ophthalmic Meds Ordered this visit:  No orders of the  defined types were placed in this encounter.      Return in about 8 weeks (around 10/27/2019) for COLOR FP, DILATE OU.  Patient Instructions  Central Retinal Artery Occlusion, Adult  Central retinal artery occlusion (CRAO) is a blockage in the main blood vessel that carries blood to the retina (central retinal artery). The retina is the part of your eye that senses light and sends signals to the brain that allow you to see. CRAO can make you lose some or all of your vision. If the condition is not treated within 90 minutes, it can result in permanent vision loss in the affected eye. What are the causes? This condition may be caused by:  A blood clot that forms in the artery (thrombus). A clot is blood that has thickened into a gel or solid.  A collection of blood  or solid material, such as fat that forms somewhere else and travels to the artery (embolus).  A disease that causes the artery to swell (vasculitis).  An infection of the heart's inner lining (endocarditis).  An injury to the artery. Sometimes the cause is not known. What increases the risk? This condition is more likely to develop in:  Males.  Adults who are age 43 or older.  Women who take birth control pills.  Pregnant women.  People who have certain medical conditions, including: ? A blood vessel disease that causes narrowing of the arteries (atherosclerosis). ? High levels of fat in the blood (hyperlipidemia). ? Giant cell arteritis. ? Diabetes. ? Heart disease. ? Sickle cell disease. ? High blood pressure (hypertension). ? A heart rhythm problem (atrial fibrillation). ? A heart valve condition. ? A blood-clotting disorder (coagulopathy). ? Inflammation of blood vessels (vasculitis). What are the signs or symptoms? The only symptom of this condition is sudden and painless vision loss. How is this diagnosed? This condition is usually diagnosed by a health care provider who specializes in eye diseases  (ophthalmologist). To diagnose the condition, the ophthalmologist will do an eye exam. She or he may also:  Do an exam that involves having eye drops put in the eye (slit lamp exam or dilated fundus exam).  Order tests, including: ? A vision test. ? A measurement of the pressure inside your eye. ? A test that checks the nerves in your retina (electroretinogram). ? A fluorescein angiogram. In this test, pictures are taken of your retina after a dye is injected into your bloodstream. ? Optical coherence tomography. In this test, light waves are used to create pictures of your retina.  Check your blood pressure.  Order other tests to find the cause of the condition. These tests may include: ? Blood tests to check for vasculitis or coagulopathy. ? An ultrasound to check for heart disease or atherosclerosis. How is this treated? Treatment for this condition may include:  Massaging the eye.  Medicines, such as steroids to treat any underlying inflammation.  Breathing in a mixture of oxygen and carbon dioxide. This widens (dilates) the arteries of your retina.  Having fluid removed from the front of your eye.  Using drops to reduce pressure in your eye.  An injection of a clot-busting medicine. You may be referred to a specialist or receive additional treatment if any of these apply:  Your condition was caused by a blood clot that formed outside your eye.  Your optic nerve is swollen.  You have symptoms of a condition called giant cell arteritis. Symptoms of this condition include: ? Headache. ? Pain with chewing. ? Scalp tenderness. ? Recent poor appetite and weight loss. Follow these instructions at home:  Use over-the-counter and prescription medicines only as told by your health care provider. These include any eye drops.  If you lose some vision again: ? Breathe into a paper bag. ? Massage your eye as directed. ? Get medical care right away.  Keep all follow-up visits  as told by your health care provider. This is important. How is this prevented? To help prevent this condition:  Work with your health care providers to reduce your risk factors.  Do not use any products that contain nicotine or tobacco, such as cigarettes and e-cigarettes. If you need help quitting, ask your health care provider.  Maintain a healthy weight.  Follow a heart-healthy diet. This diet includes a lot of fruits, vegetables, grains, and lean protein. It  is low in saturated fat and sugar.  Exercise regularly. Contact a health care provider if:  You have any changes in your vision. Get help right away if:  You have eye pain.  You have new or sudden vision loss. Summary  Central retinal artery occlusion (CRAO) is a blockage in the main blood vessel that carries blood to the retina (central retinal artery). The retina is the part of your eye that senses light and sends signals to the brain that allow you to see.  CRAO can make you lose some or all of your vision. If the condition is not treated within 90 minutes, it can result in permanent vision loss in the affected eye.  Treatment for this condition may include medicines, eye massage, eye drops, and certain procedures.  Get help right away if you have new or sudden vision loss. This information is not intended to replace advice given to you by your health care provider. Make sure you discuss any questions you have with your health care provider. Document Revised: 04/17/2017 Document Reviewed: 07/11/2016 Elsevier Patient Education  2020 [employer].     Explained the diagnoses, plan, and follow up with the patient and they expressed understanding.  Patient expressed understanding of the importance of proper follow up care.   Clent Demark Decari Duggar M.D. Diseases & Surgery of the Retina and Vitreous [facility]ina [facility] Di[facility] [place] 09/01/19     Abbreviations: M myopia (nearsighted); A astigmatism; H hyperopia (farsighted);  P presbyopia; Mrx spectacle prescription;  CTL contact lenses; OD right eye; OS left eye; OU both eyes  XT exotropia; ET esotropia; PEK punctate epithelial keratitis; PEE punctate epithelial erosions; DES dry eye syndrome; MGD meibomian gland dysfunction; ATs artificial tears; PFAT's preservative free artificial tears; [place] nuclear sclerotic cataract; PSC posterior subcapsular cataract; ERM epi-retinal membrane; PVD posterior vitreous detachment; RD retinal detachment; DM diabetes mellitus; DR diabetic retinopathy; NPDR non-proliferative diabetic retinopathy; PDR proliferative diabetic retinopathy; CSME clinically significant macular edema; DME diabetic macular edema; dbh dot blot hemorrhages; CWS cotton wool spot; POAG primary open angle glaucoma; C/D cup-to-disc ratio; HVF humphrey visual field; GVF goldmann visual field; OCT optical coherence tomography; IOP intraocular pressure; BRVO Branch retinal vein occlusion; CRVO central retinal vein occlusion; CRAO central retinal artery occlusion; BRAO branch retinal artery occlusion; RT retinal tear; SB scleral buckle; PPV pars plana vitrectomy; VH Vitreous hemorrhage; PRP panretinal laser photocoagulation; IVK intravitreal kenalog; VMT vitreomacular traction; MH Macular hole;  NVD neovascularization of the disc; NVE neovascularization elsewhere; AREDS age related eye disease study; ARMD age related macular degeneration; POAG primary open angle glaucoma; EBMD epithelial/anterior basement membrane dystrophy; ACIOL anterior chamber intraocular lens; IOL intraocular lens; PCIOL posterior chamber intraocular lens; Phaco/IOL phacoemulsification with intraocular lens placement; [place] photorefractive keratectomy; LASIK laser assisted in situ keratomileusis; HTN hypertension; DM diabetes mellitus; COPD chronic obstructive pulmonary disease

## 2019-09-20 ENCOUNTER — Other Ambulatory Visit: Payer: Self-pay

## 2019-09-20 ENCOUNTER — Telehealth: Payer: Self-pay

## 2019-09-20 ENCOUNTER — Telehealth: Payer: Self-pay | Admitting: Hematology and Oncology

## 2019-09-20 DIAGNOSIS — D472 Monoclonal gammopathy: Secondary | ICD-10-CM

## 2019-09-20 DIAGNOSIS — D649 Anemia, unspecified: Secondary | ICD-10-CM

## 2019-09-20 NOTE — Telephone Encounter (Signed)
Called pt daughter per 5/4 sch message - no answer ./ left message for pt daughter to call back and reschedule appt

## 2019-09-20 NOTE — Progress Notes (Signed)
Lab orders faxed to [facility] in [place] per daughter request.  Successfully faxed to [phone].

## 2019-09-20 NOTE — Telephone Encounter (Signed)
RN returned call to daughter, voicemail left for return call.

## 2019-09-21 ENCOUNTER — Ambulatory Visit: Payer: Medicare Other | Admitting: Hematology and Oncology

## 2019-09-21 ENCOUNTER — Other Ambulatory Visit: Payer: Medicare Other

## 2019-09-26 ENCOUNTER — Encounter: Payer: Self-pay | Admitting: Hematology and Oncology

## 2019-10-05 NOTE — Progress Notes (Signed)
Patient Care Team: Tisovec, Fransico Him, MD as PCP - General (Internal Medicine) Fay Records, MD as PCP - Cardiology (Cardiology) Marchia Bond, MD as Consulting Physician (Orthopedic Surgery)  DIAGNOSIS:    ICD-10-CM   1. 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 with above-mentioned history of MGUS, B-12 deficiency, and anemia of chronic kidney disease. She presents to the clinic today for follow-up.  Patient has been going through multiple health issues including admissions for pneumonia several times and her cardiac issues as well including atrial fibrillation with rapid ventricular rate and she is going to undergo cardioversion very soon.  She is unable to tolerate anticoagulation because of bleeding issues.  She continues to receive every 2-week B12 injections but it has not changed her overall health situation because of all the other medical issues that she is going through.  ALLERGIES:  is allergic to iodinated diagnostic agents; iohexol; codeine; oxycodone; levaquin [levofloxacin in d5w]; and propoxyphene n-acetaminophen.  MEDICATIONS:  Current Outpatient Medications  Medication Sig Dispense Refill  . amiodarone (PACERONE) 200 MG tablet TAKE 1 TABLET BY MOUTH EVERY DAY 90 tablet 3  . atorvastatin (LIPITOR) 80 MG tablet Take 80 mg by mouth every morning.     . B-D 3CC LUER-LOK SYR 25GX5/8" 25G X 5/8" 3 ML MISC INJECT 1 ML INTO THE MUSCLE EVERY 30 (THIRTY) DAYS. 3 each 0  . carboxymethylcellulose (REFRESH PLUS) 0.5 % SOLN Place 1 drop into both eyes 3 (three) times daily as needed (for dry/irritated eyes.).     Marland Kitchen Cyanocobalamin (B-12 COMPLIANCE INJECTION) 1000 MCG/ML KIT Inject 1,000 mcg as directed every 14 (fourteen) days.    . diphenhydrAMINE (BENADRYL) 25 MG tablet Take 25 mg by mouth at bedtime.     Marland Kitchen ELIQUIS 5 MG TABS  tablet TAKE 1 TABLET BY MOUTH TWICE A DAY (Patient not taking: Reported on 09/01/2019) 60 tablet 5  . fenofibrate 160 MG tablet Take 160 mg by mouth every morning.     . furosemide (LASIX) 40 MG tablet Take 1 tablet (40 mg total) by mouth as needed. 90 tablet 3  . hydrochlorothiazide (HYDRODIURIL) 25 MG tablet Take 1 tablet (25 mg total) by mouth 2 (two) times daily. 180 tablet 3  . HYDROcodone-acetaminophen (NORCO) 5-325 MG tablet Take 1-2 tablets by mouth every 6 (six) hours as needed for moderate pain. MAXIMUM TOTAL ACETAMINOPHEN DOSE IS 4000 MG PER DAY 30 tablet 0  . JANUVIA 50 MG tablet Take 50 mg by mouth every morning.   1  . LANTUS SOLOSTAR 100 UNIT/ML Solostar Pen Inject 45 Units into the skin at bedtime. Per sliding scale    . lidocaine (LIDODERM) 5 % Place 1 patch onto the skin daily as needed (for pain). Remove & Discard patch within 12 hours or as directed by MD    . Menthol, Topical Analgesic, (BIOFREEZE EX) Apply 1 application topically 2 (two) times daily as needed (for pain).     . Menthol, Topical Analgesic, 10 % LIQD Apply topically.    . metoprolol tartrate (LOPRESSOR) 25 MG tablet Take 0.5 tablets (12.5 mg total) by mouth 2 (two) times daily. 90 tablet 3  . metroNIDAZOLE (FLAGYL) 500 MG tablet Take 500 mg by mouth 2 (two) times daily.    . Multiple Vitamins-Minerals (PRESERVISION AREDS 2 PO) Take 1 capsule by mouth 2 (two)  times daily.    . Needles & Syringes MISC 1 Units by Does not apply route every 30 (thirty) days.  0  . omeprazole (PRILOSEC) 20 MG capsule Take 20 mg by mouth daily before breakfast.     . potassium chloride SA (KLOR-CON M20) 20 MEQ tablet Take 1 tablet (20 mEq total) by mouth as needed. 90 tablet 3  . promethazine (PHENERGAN) 12.5 MG tablet Take 1 tablet (12.5 mg total) by mouth every 6 (six) hours as needed for nausea or vomiting. 30 tablet 0  . sennosides-docusate sodium (SENOKOT-S) 8.6-50 MG tablet Take 2 tablets by mouth daily. 30 tablet 1   No current  facility-administered medications for this visit.    PHYSICAL EXAMINATION: ECOG PERFORMANCE STATUS: 3 - Symptomatic, >50% confined to bed  Vitals:   10/06/19 1446  BP: 116/61  Pulse: 96  Resp: 17  Temp: 98.7 F (37.1 C)  SpO2: 95%   Filed Weights   10/06/19 1446  Weight: 191 lb (86.6 kg)    LABORATORY DATA:  I have reviewed the data as listed CMP Latest Ref Rng & Units 04/30/2018 03/25/2018 03/04/2018  Glucose 65 - 99 mg/dL 98 95 209(H)  BUN 8 - 27 mg/dL 32(H) 23 50(H)  Creatinine 0.57 - 1.00 mg/dL 1.46(H) 1.33(H) 2.15(H)  Sodium 134 - 144 mmol/L 142 142 138  Potassium 3.5 - 5.2 mmol/L 4.3 4.6 4.9  Chloride 96 - 106 mmol/L 100 101 107  CO2 20 - 29 mmol/L 25 26 20(L)  Calcium 8.7 - 10.3 mg/dL 9.3 9.0 8.6(L)  Total Protein 6.5 - 8.1 g/dL - - -  Total Bilirubin 0.3 - 1.2 mg/dL - - -  Alkaline Phos 38 - 126 U/L - - -  AST 15 - 41 U/L - - -  ALT 0 - 44 U/L - - -    Lab Results  Component Value Date   WBC 11.1 (H) 05/17/2018   HGB 10.0 (L) 05/17/2018   HCT 33.9 (L) 05/17/2018   MCV 89.7 05/17/2018   PLT 244 05/17/2018   NEUTROABS 7.4 05/17/2018    ASSESSMENT & PLAN:  MGUS (monoclonal gammopathy of unknown significance) Lab review  previously. B12 95 [id number]: M protein: 0.6g IgM lambda 02/15/2018: M protein: 0.3 g, IgM lambda,lambda: 31.6, kappa: 57.9, ratio 1.83, creatinine 1.6, hemoglobin 10 05/17/2018: Hemoglobin 10 09/10/2018: Hemoglobin 9.5, MCV 87, ferritin 46, beta-2 microglobulin 5.8 SPEPM protein 0.5 g, IgM 957, kappa 62, lambda 42, ratio 1.46, iron saturation 15%, ferritin 46, beta-2 microglobulin 5.8  Current treatment: B12 injections monthly at home every 2 weeks   Multiple health issues including frequent pneumonias Atrial fibrillation with RVR: Going to undergo cardioversion Patient had labs done at [facility] in [place].  We are trying to get a copy of those results. She would like to do annual blood work at [facility] as well.  I  discussed with the patient that MGUS and breast cancer are probably the least of her concerns at this time. We will see her back in 1 year with labs.    No orders of the defined types were placed in this encounter.  The patient has a good understanding of the overall plan. she agrees with it. she will call with any problems that may develop before the next visit here.  Total time spent: 20 mins including face to face time and time spent for planning, charting and coordination of care  Nicholas Lose, MD 10/06/2019  I, Cloyde Reams Dorshimer, am acting as scribe for Dr. Loleta Dicker  Antonio Woodhams.  I have reviewed the above documentation for accuracy and completeness, and I agree with the above.

## 2019-10-06 ENCOUNTER — Inpatient Hospital Stay: Payer: Medicare Other | Attending: Hematology and Oncology | Admitting: Hematology and Oncology

## 2019-10-06 ENCOUNTER — Other Ambulatory Visit: Payer: Self-pay

## 2019-10-06 DIAGNOSIS — D472 Monoclonal gammopathy: Secondary | ICD-10-CM | POA: Insufficient documentation

## 2019-10-06 DIAGNOSIS — N189 Chronic kidney disease, unspecified: Secondary | ICD-10-CM | POA: Insufficient documentation

## 2019-10-06 DIAGNOSIS — E538 Deficiency of other specified B group vitamins: Secondary | ICD-10-CM | POA: Insufficient documentation

## 2019-10-06 DIAGNOSIS — D631 Anemia in chronic kidney disease: Secondary | ICD-10-CM | POA: Insufficient documentation

## 2019-10-06 NOTE — Assessment & Plan Note (Signed)
Lab review  previously. B12 95 [id number]: M protein: 0.6g IgM lambda 02/15/2018: M protein: 0.3 g, IgM lambda,lambda: 31.6, kappa: 57.9, ratio 1.83, creatinine 1.6, hemoglobin 10 05/17/2018: Hemoglobin 10 09/10/2018: Hemoglobin 9.5, MCV 87, ferritin 46, beta-2 microglobulin 5.8 SPEPM protein 0.5 g, IgM 957, kappa 62, lambda 42, ratio 1.46, iron saturation 15%, ferritin 46, beta-2 microglobulin 5.8  Current treatment: B12 injections monthly at home every 2 weeks Lab review

## 2019-10-27 ENCOUNTER — Encounter (INDEPENDENT_AMBULATORY_CARE_PROVIDER_SITE_OTHER): Payer: Medicare Other | Admitting: Ophthalmology

## 2019-11-03 ENCOUNTER — Encounter (INDEPENDENT_AMBULATORY_CARE_PROVIDER_SITE_OTHER): Payer: Medicare Other | Admitting: Ophthalmology

## 2019-12-05 ENCOUNTER — Other Ambulatory Visit: Payer: Self-pay

## 2019-12-05 ENCOUNTER — Ambulatory Visit (INDEPENDENT_AMBULATORY_CARE_PROVIDER_SITE_OTHER): Payer: Medicare Other | Admitting: Ophthalmology

## 2019-12-05 ENCOUNTER — Encounter (INDEPENDENT_AMBULATORY_CARE_PROVIDER_SITE_OTHER): Payer: Self-pay | Admitting: Ophthalmology

## 2019-12-05 DIAGNOSIS — H34232 Retinal artery branch occlusion, left eye: Secondary | ICD-10-CM

## 2019-12-05 DIAGNOSIS — H353132 Nonexudative age-related macular degeneration, bilateral, intermediate dry stage: Secondary | ICD-10-CM

## 2019-12-05 NOTE — Patient Instructions (Signed)
Patient to notify us if either eye develops new onset permanent persistent distorted or wavy vision

## 2019-12-05 NOTE — Assessment & Plan Note (Signed)
Work-up for prior BRA O OS complete, clear carotids on Doppler examination as well as intracranial vasculature examination per patient.  She also notes that the biopsy for the temporal artery was normal.

## 2019-12-05 NOTE — Progress Notes (Addendum)
12/05/2019     CHIEF COMPLAINT Patient presents for Retina Follow Up   HISTORY OF PRESENT ILLNESS: Gloria [name] is a 79 y.o. female who presents to the clinic today for:   HPI    Retina Follow Up    Patient presents with  CRVO/BRVO.  In both eyes.  Duration of 3 months.  Since onset it is gradually worsening.          Comments    3 month f.u - OCT OU  Patient states that she has had a decrease in vision since last visit OS distortion of vision, which is intermittent when she is looking at lines on the driving down the road. Patient states when she looks at a straight line, it has curves in it. A1C 6.5 /// LBS 105        Last edited by Hurman Horn, MD on 12/05/2019 12:26 PM. (History)      Referring physician: Haywood Pao, MD [place],  [place] [address]  HISTORICAL INFORMATION:   Selected notes from the MEDICAL RECORD NUMBER    Lab Results  Component Value Date   HGBA1C 7.1 (H) 02/23/2018     CURRENT MEDICATIONS: Current Outpatient Medications (Ophthalmic Drugs)  Medication Sig   carboxymethylcellulose (REFRESH PLUS) 0.5 % SOLN Place 1 drop into both eyes 3 (three) times daily as needed (for dry/irritated eyes.).    No current facility-administered medications for this visit. (Ophthalmic Drugs)   Current Outpatient Medications (Other)  Medication Sig   amiodarone (PACERONE) 200 MG tablet TAKE 1 TABLET BY MOUTH EVERY DAY   atorvastatin (LIPITOR) 80 MG tablet Take 80 mg by mouth every morning.    B-D 3CC LUER-LOK SYR 25GX5/8" 25G X 5/8" 3 ML MISC INJECT 1 ML INTO THE MUSCLE EVERY 30 (THIRTY) DAYS.   Cyanocobalamin (B-12 COMPLIANCE INJECTION) 1000 MCG/ML KIT Inject 1,000 mcg as directed every 14 (fourteen) days.   diltiazem (DILACOR XR) 180 MG 24 hr capsule Take 180 mg by mouth daily.   diphenhydrAMINE (BENADRYL) 25 MG tablet Take 25 mg by mouth at bedtime.    ELIQUIS 5 MG TABS tablet TAKE 1 TABLET BY MOUTH TWICE A DAY (Patient  not taking: Reported on 09/01/2019)   fenofibrate 160 MG tablet Take 160 mg by mouth every morning.    ferrous sulfate 324 MG TBEC Take 324 mg by mouth.   hydrALAZINE (APRESOLINE) 25 MG tablet Take 25 mg by mouth daily.   hydrochlorothiazide (HYDRODIURIL) 25 MG tablet Take 1 tablet (25 mg total) by mouth 2 (two) times daily.   JANUVIA 50 MG tablet Take 50 mg by mouth every morning.    LANTUS SOLOSTAR 100 UNIT/ML Solostar Pen Inject 45 Units into the skin at bedtime. Per sliding scale   lidocaine (LIDODERM) 5 % Place 1 patch onto the skin daily as needed (for pain). Remove & Discard patch within 12 hours or as directed by MD   Menthol, Topical Analgesic, (BIOFREEZE EX) Apply 1 application topically 2 (two) times daily as needed (for pain).    metoprolol tartrate (LOPRESSOR) 25 MG tablet Take 0.5 tablets (12.5 mg total) by mouth 2 (two) times daily.   metroNIDAZOLE (FLAGYL) 500 MG tablet Take 500 mg by mouth 2 (two) times daily.   Needles & Syringes MISC 1 Units by Does not apply route every 30 (thirty) days.   omeprazole (PRILOSEC) 20 MG capsule Take 20 mg by mouth daily before breakfast.    promethazine (PHENERGAN) 12.5  MG tablet Take 1 tablet (12.5 mg total) by mouth every 6 (six) hours as needed for nausea or vomiting.   sennosides-docusate sodium (SENOKOT-S) 8.6-50 MG tablet Take 2 tablets by mouth daily.   torsemide (DEMADEX) 20 MG tablet Take 20 mg by mouth in the morning, at noon, and at bedtime.   warfarin (COUMADIN) 2.5 MG tablet Take 2.5 mg by mouth daily. Tuesday, Wednesday, Thursday, Saturday, Sunday   warfarin (COUMADIN) 5 MG tablet Take 5 mg by mouth daily. Monday and Friday   No current facility-administered medications for this visit. (Other)      REVIEW OF SYSTEMS:    ALLERGIES Allergies  Allergen Reactions   Iodinated Diagnostic Agents Anaphylaxis   Iohexol Hives, Swelling and Other (See Comments)     Desc: ANGIO EDEMA W/ ANAPHYLAXIS PRIOR... HIVES  POST 13 HR PREP...OK/A.C.    Codeine Nausea And Vomiting    SEVERE   Oxycodone Nausea And Vomiting    SEVERE   Levaquin [Levofloxacin In D5w] Nausea Only and Other (See Comments)    dizziness   Propoxyphene N-Acetaminophen Nausea And Vomiting    PAST MEDICAL HISTORY Past Medical History:  Diagnosis Date   Anticoagulant long-term use    eliquis   B12 deficiency    Carotid stenosis, bilateral    followed by dr Trula Slade--- per last duplex 08-11-2017  patent right ICA,  left ICA 40-59%,  left CEA >50%   Chronic anemia    CKD (chronic kidney disease), stage III    Diabetic retinopathy ([place])    Diastolic CHF, chronic ([place])    Family history of adverse reaction to anesthesia    daughter - violent   GERD (gastroesophageal reflux disease)    Hyperlipidemia    Hypertension    Macular degeneration    Right eye only,  loss of peripheral vision   MGUS (monoclonal gammopathy of unknown significance)    followed by dr Lindi Adie ([facility])   OA (osteoarthritis)    knees, fingers, back   OSA on CPAP 11/05/2017   Severe sleep apnea during REM sleep with oxygen saturations dropping to 78%.  Due to severe nocturnal hypoxemia as well as significant sleep apnea during REM sleep she is now on CPAP at  11 cm H2O   PAF (paroxysmal atrial fibrillation) ([place]) 05/2017   [facility] cardiologist-  dr Dorris Carnes   Peripheral neuropathy    bilateral feet post back surgery   Peripheral vascular disease ([place])    hx rt carotid s/p cea   PONV (postoperative nausea and vomiting)    Primary localized osteoarthritis of right knee 03/02/2018   Type 2 diabetes mellitus treated with insulin ([place])    followed by pcp   Past Surgical History:  Procedure Laterality Date   CARDIAC CATHETERIZATION  05-27-2005   dr Doreatha Lew   mild to moderate coronary atherosclerosis, 60% ostial narrowing bifurcation OM and 60% D2 w/ minor irregularities otherwise ,  normal LVF   CARDIOVASCULAR STRESS  TEST  02-26-2012    dr Aundra Dubin   normal dobutamin nuclera study w/ no ischemia/  normal LV funciton and wall motion , ef 66%   CARDIOVERSION N/A 06/07/2017   Procedure: CARDIOVERSION;  Surgeon: Thompson Grayer, MD;  Location: [facility];  Service: Cardiovascular;  Laterality: N/A;   CAROTID ENDARTERECTOMY Right 06-06-2005   dr Amedeo Plenty _0    CATARACT EXTRACTION Right 08/20/2016   Dr. Katy Fitch   CATARACT EXTRACTION Left 10/11/2016   Dr. Katy Fitch   CATARACT EXTRACTION W/ INTRAOCULAR LENS  IMPLANT, BILATERAL  2018   CHOLECYSTECTOMY OPEN  1968   AND APPENDECTOMY   EYE SURGERY Right 2017    Retinal tear-    LUMBAR LAMINECTOMY/DECOMPRESSION MICRODISCECTOMY  02/27/2012   Procedure: LUMBAR LAMINECTOMY/DECOMPRESSION MICRODISCECTOMY 2 LEVELS;  Surgeon: Kristeen Miss, MD;  Location: [place] [facility];  Service: Neurosurgery;  Laterality: N/A;  Lumbar two-three, four-five Laminectomy/foraminotomy   TONSILLECTOMY  AGE 80   TOTAL KNEE ARTHROPLASTY Right 03/02/2018   Procedure: RIGHT TOTAL KNEE ARTHROPLASTY;  Surgeon: Marchia Bond, MD;  Location: [facility];  Service: Orthopedics;  Laterality: Right;   TRANSTHORACIC ECHOCARDIOGRAM  06-02-2017   dr p. ross   mild LVH,  ef 55%, grade 2 diastoic dysfunction/  trivial MR and TR/  moderate LAE/  mild RAE    FAMILY HISTORY Family History  Problem Relation Age of Onset   Heart disease Mother        Heart Disease before age 64   Heart disease Father    Heart disease Sister        Heart Disease before age 44   Breast cancer Daughter 74    SOCIAL HISTORY Social History   Tobacco Use   Smoking status: Former Smoker    Packs/day: 0.25    Years: 32.00    Pack years: 8.00    Types: Cigarettes    Quit date: 02/20/2004    Years since quitting: 15.8   Smokeless tobacco: Never Used  Scientific laboratory technician Use: Never used  Substance Use Topics   Alcohol use: No   Drug use: Never         OPHTHALMIC EXAM:  Base Eye Exam    Visual Acuity (Snellen -  Linear)      Right Left   Dist [place] CF @ 3' 20/40   Dist ph [place] NI 20/40+2       Tonometry (Tonopen, 11:07 AM)      Right Left   Pressure 15 13       Pupils      Pupils Dark Light Shape React APD   Right PERRL 3 3 Round Minimal None   Left PERRL 3 3 Round Minimal None       Visual Fields (Counting fingers)      Left Right    Full Full       Extraocular Movement      Right Left    Full Full       Neuro/Psych    Oriented x3: Yes   Mood/Affect: Normal       Dilation    Both eyes: 1.0% Mydriacyl, 2.5% Phenylephrine @ 11:07 AM        Slit Lamp and Fundus Exam    External Exam      Right Left   External Normal Normal       Slit Lamp Exam      Right Left   Lids/Lashes Normal Normal   Conjunctiva/Sclera White and quiet White and quiet   Cornea Clear Clear   Anterior Chamber Deep and quiet Deep and quiet   Iris Round and reactive Round and reactive   Lens Posterior chamber intraocular lens Posterior chamber intraocular lens   Anterior Vitreous Normal Normal       Fundus Exam      Right Left   Posterior Vitreous Posterior vitreous detachment Normal   Disc Normal Normal, calcific Hollenhorst plaque at the nerve inferior branch retinal artery bifurcation   C/D Ratio 0.3 0.25   Macula Pigmented atrophy  Geographic atrophy, Retinal pigment epithelial mottling   Vessels Normal Retinal artery occlusion left eye inferiorly with cholesterol calcific embolus   Periphery , Peripheral, retina attached. Normal          IMAGING AND PROCEDURES  Imaging and Procedures for 12/05/19  Color Fundus Photography TopCon - OU - Both Eyes       Right Eye Progression has been stable.   Left Eye Progression has been stable.   Notes OS, calcific Hollenhorst plaque at the first branch of the inferotemporal retinal artery stable over time, no new retinal whitening.       OCT, Retina - OU - Both Eyes       Right Eye Quality was borderline. Scan locations included  subfoveal. Progression has been stable. Findings include central retinal atrophy, outer retinal atrophy, inner retinal atrophy, no SRF, no IRF, abnormal foveal contour, subretinal scarring.   Left Eye Quality was good. Scan locations included subfoveal. Central Foveal Thickness: 281.   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  She also notes that the biopsy for the temporal artery was normal.      ICD-10-CM   1. 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 Intermediate stage nonexudative age-related macular degeneration of both eyes  H35.3132 OCT, Retina - OU - Both Eyes    1.  No progression of branch retinal artery occlusion left eye, patient is on anticoagulation.  The work-up was negative.  2.  New symptoms of intermittent distortion of vision the left eye does suggest the possibility of wet ARMD however no evidence on OCT of subretinal fluid nor CME nor intraretinal fluid.  Will observe 3.  Follow-up in 3 weeks simple OCT to make sure that no progression occurs  Ophthalmic Meds Ordered this visit:  No orders of the defined types were placed in this encounter.      Return in about 2 months (around 02/05/2020) for DILATE OU, OCT.  Patient Instructions  Patient to notify us if either eye develops new onset permanent persistent distorted or wavy vision    Explained the diagnoses, plan, and follow up with the patient and they expressed understanding.  Patient expressed understanding of the importance of proper follow up care.   Clent Demark Baine Decesare M.D. Diseases & Surgery of the Retina and Vitreous Retina & Diabetic [place] 12/05/19     Abbreviations: M myopia (nearsighted); A  astigmatism; H hyperopia (farsighted); P presbyopia; Mrx spectacle prescription;  CTL contact lenses; OD right eye; OS left eye; OU both eyes  XT exotropia; ET esotropia; PEK punctate epithelial keratitis; PEE punctate epithelial erosions; DES dry eye syndrome; MGD meibomian gland dysfunction; ATs artificial tears; PFAT's preservative free artificial tears; [place] nuclear sclerotic cataract; PSC posterior subcapsular cataract; ERM epi-retinal membrane; PVD posterior vitreous detachment; RD retinal detachment; DM diabetes mellitus; DR diabetic retinopathy; NPDR non-proliferative diabetic retinopathy; PDR proliferative diabetic retinopathy; CSME clinically significant macular edema; DME diabetic macular edema; dbh dot blot hemorrhages; CWS cotton wool spot; POAG primary open angle glaucoma; C/D cup-to-disc ratio; HVF humphrey visual field; GVF goldmann visual field; OCT optical coherence tomography; IOP intraocular pressure; BRVO  Branch retinal vein occlusion; CRVO central retinal vein occlusion; CRAO central retinal artery occlusion; BRAO branch retinal artery occlusion; RT retinal tear; SB scleral buckle; PPV pars plana vitrectomy; VH Vitreous hemorrhage; PRP panretinal laser photocoagulation; IVK intravitreal kenalog; VMT vitreomacular traction; MH Macular hole;  NVD neovascularization of the disc; NVE neovascularization elsewhere; AREDS age related eye disease study; ARMD age related macular degeneration; POAG primary open angle glaucoma; EBMD epithelial/anterior basement membrane dystrophy; ACIOL anterior chamber intraocular lens; IOL intraocular lens; PCIOL posterior chamber intraocular lens; Phaco/IOL phacoemulsification with intraocular lens placement; Scott photorefractive keratectomy; LASIK laser assisted in situ keratomileusis; HTN hypertension; DM diabetes mellitus; COPD chronic obstructive pulmonary disease

## 2019-12-29 ENCOUNTER — Encounter (INDEPENDENT_AMBULATORY_CARE_PROVIDER_SITE_OTHER): Payer: Medicare Other | Admitting: Ophthalmology

## 2020-01-09 ENCOUNTER — Encounter (INDEPENDENT_AMBULATORY_CARE_PROVIDER_SITE_OTHER): Payer: Medicare Other | Admitting: Ophthalmology

## 2020-02-06 ENCOUNTER — Ambulatory Visit (INDEPENDENT_AMBULATORY_CARE_PROVIDER_SITE_OTHER): Payer: Medicare Other | Admitting: Ophthalmology

## 2020-02-06 ENCOUNTER — Other Ambulatory Visit: Payer: Self-pay

## 2020-02-06 ENCOUNTER — Encounter (INDEPENDENT_AMBULATORY_CARE_PROVIDER_SITE_OTHER): Payer: Self-pay | Admitting: Ophthalmology

## 2020-02-06 DIAGNOSIS — H34232 Retinal artery branch occlusion, left eye: Secondary | ICD-10-CM

## 2020-02-06 DIAGNOSIS — E119 Type 2 diabetes mellitus without complications: Secondary | ICD-10-CM | POA: Diagnosis not present

## 2020-02-06 NOTE — Assessment & Plan Note (Signed)

## 2020-02-06 NOTE — Progress Notes (Signed)
02/06/2020     CHIEF COMPLAINT Patient presents for Retina Follow Up   HISTORY OF PRESENT ILLNESS: Gloria [name] is a 79 y.o. female who presents to the clinic today for:   HPI    Retina Follow Up    Patient presents with  CRVO/BRVO.  In left eye.  This started 2 months ago.  Severity is moderate.  Duration of 2 months.  Since onset it is gradually worsening.          Comments    2 Month F/U OU  Pt reports increased waviness OS x 1 month approx. Pt sts waviness is "all the time now." OD VA stable.       Last edited by Rockie Neighbours, [place] on 02/06/2020 10:27 AM. (History)      Referring physician: Haywood Pao, MD [place],  [place] [id number]  HISTORICAL INFORMATION:   Selected notes from the MEDICAL RECORD NUMBER    Lab Results  Component Value Date   HGBA1C 7.1 (H) 02/23/2018     CURRENT MEDICATIONS: Current Outpatient Medications (Ophthalmic Drugs)  Medication Sig   carboxymethylcellulose (REFRESH PLUS) 0.5 % SOLN Place 1 drop into both eyes 3 (three) times daily as needed (for dry/irritated eyes.).    No current facility-administered medications for this visit. (Ophthalmic Drugs)   Current Outpatient Medications (Other)  Medication Sig   amiodarone (PACERONE) 200 MG tablet TAKE 1 TABLET BY MOUTH EVERY DAY   atorvastatin (LIPITOR) 80 MG tablet Take 80 mg by mouth every morning.    B-D 3CC LUER-LOK SYR 25GX5/8" 25G X 5/8" 3 ML MISC INJECT 1 ML INTO THE MUSCLE EVERY 30 (THIRTY) DAYS.   Cyanocobalamin (B-12 COMPLIANCE INJECTION) 1000 MCG/ML KIT Inject 1,000 mcg as directed every 14 (fourteen) days.   diltiazem (DILACOR XR) 180 MG 24 hr capsule Take 180 mg by mouth daily.   diphenhydrAMINE (BENADRYL) 25 MG tablet Take 25 mg by mouth at bedtime.    ELIQUIS 5 MG TABS tablet TAKE 1 TABLET BY MOUTH TWICE A DAY (Patient not taking: Reported on 09/01/2019)   fenofibrate 160 MG tablet Take 160 mg by mouth every morning.    ferrous  sulfate 324 MG TBEC Take 324 mg by mouth.   hydrALAZINE (APRESOLINE) 25 MG tablet Take 25 mg by mouth daily.   hydrochlorothiazide (HYDRODIURIL) 25 MG tablet Take 1 tablet (25 mg total) by mouth 2 (two) times daily.   JANUVIA 50 MG tablet Take 50 mg by mouth every morning.    LANTUS SOLOSTAR 100 UNIT/ML Solostar Pen Inject 45 Units into the skin at bedtime. Per sliding scale   lidocaine (LIDODERM) 5 % Place 1 patch onto the skin daily as needed (for pain). Remove & Discard patch within 12 hours or as directed by MD   Menthol, Topical Analgesic, (BIOFREEZE EX) Apply 1 application topically 2 (two) times daily as needed (for pain).    metoprolol tartrate (LOPRESSOR) 25 MG tablet Take 0.5 tablets (12.5 mg total) by mouth 2 (two) times daily.   metroNIDAZOLE (FLAGYL) 500 MG tablet Take 500 mg by mouth 2 (two) times daily.   Needles & Syringes MISC 1 Units by Does not apply route every 30 (thirty) days.   omeprazole (PRILOSEC) 20 MG capsule Take 20 mg by mouth daily before breakfast.    promethazine (PHENERGAN) 12.5 MG tablet Take 1 tablet (12.5 mg total) by mouth every 6 (six) hours as needed for nausea or vomiting.   sennosides-docusate sodium (SENOKOT-S) 8.6-50  MG tablet Take 2 tablets by mouth daily.   torsemide (DEMADEX) 20 MG tablet Take 20 mg by mouth in the morning, at noon, and at bedtime.   warfarin (COUMADIN) 2.5 MG tablet Take 2.5 mg by mouth daily. Tuesday, Wednesday, Thursday, Saturday, Sunday   warfarin (COUMADIN) 5 MG tablet Take 5 mg by mouth daily. Monday and Friday   No current facility-administered medications for this visit. (Other)      REVIEW OF SYSTEMS:    ALLERGIES Allergies  Allergen Reactions   Iodinated Diagnostic Agents Anaphylaxis   Iohexol Hives, Swelling and Other (See Comments)     Desc: ANGIO EDEMA W/ ANAPHYLAXIS PRIOR... HIVES POST 13 HR PREP...OK/A.C.    Codeine Nausea And Vomiting    SEVERE   Oxycodone Nausea And Vomiting     SEVERE   Levaquin [Levofloxacin In D5w] Nausea Only and Other (See Comments)    dizziness   Propoxyphene N-Acetaminophen Nausea And Vomiting    PAST MEDICAL HISTORY Past Medical History:  Diagnosis Date   Anticoagulant long-term use    eliquis   B12 deficiency    Carotid stenosis, bilateral    followed by dr Trula Slade--- per last duplex 08-11-2017  patent right ICA,  left ICA 40-59%,  left CEA >50%   Chronic anemia    CKD (chronic kidney disease), stage III    Diabetic retinopathy ([place])    Diastolic CHF, chronic ([place])    Family history of adverse reaction to anesthesia    daughter - violent   GERD (gastroesophageal reflux disease)    Hyperlipidemia    Hypertension    Macular degeneration    Right eye only,  loss of peripheral vision   MGUS (monoclonal gammopathy of unknown significance)    followed by dr Lindi Adie ([facility])   OA (osteoarthritis)    knees, fingers, back   OSA on CPAP 11/05/2017   Severe sleep apnea during REM sleep with oxygen saturations dropping to 78%.  Due to severe nocturnal hypoxemia as well as significant sleep apnea during REM sleep she is now on CPAP at  11 cm H2O   PAF (paroxysmal atrial fibrillation) ([place]) 05/2017   [facility] cardiologist-  dr Dorris Carnes   Peripheral neuropathy    bilateral feet post back surgery   Peripheral vascular disease ([place])    hx rt carotid s/p cea   PONV (postoperative nausea and vomiting)    Primary localized osteoarthritis of right knee 03/02/2018   Type 2 diabetes mellitus treated with insulin ([place])    followed by pcp   Past Surgical History:  Procedure Laterality Date   CARDIAC CATHETERIZATION  05-27-2005   dr Doreatha Lew   mild to moderate coronary atherosclerosis, 60% ostial narrowing bifurcation OM and 60% D2 w/ minor irregularities otherwise ,  normal LVF   CARDIOVASCULAR STRESS TEST  02-26-2012    dr Aundra Dubin   normal dobutamin nuclera study w/ no ischemia/  normal LV funciton and wall  motion , ef 66%   CARDIOVERSION N/A 06/07/2017   Procedure: CARDIOVERSION;  Surgeon: Thompson Grayer, MD;  Location: [facility];  Service: Cardiovascular;  Laterality: N/A;   CAROTID ENDARTERECTOMY Right 06-06-2005   dr Amedeo Plenty '@[facility]'    CATARACT EXTRACTION Right 08/20/2016   Dr. Katy Fitch   CATARACT EXTRACTION Left 10/11/2016   Dr. Katy Fitch   CATARACT EXTRACTION W/ INTRAOCULAR LENS  IMPLANT, BILATERAL  2018   [place] Right 2017    Retinal tear-  LUMBAR LAMINECTOMY/DECOMPRESSION MICRODISCECTOMY  02/27/2012   Procedure: LUMBAR LAMINECTOMY/DECOMPRESSION MICRODISCECTOMY 2 LEVELS;  Surgeon: Kristeen Miss, MD;  Location: [facility];  Service: Neurosurgery;  Laterality: N/A;  Lumbar two-three, four-five Laminectomy/foraminotomy   TONSILLECTOMY  AGE 42   TOTAL KNEE ARTHROPLASTY Right 03/02/2018   Procedure: RIGHT TOTAL KNEE ARTHROPLASTY;  Surgeon: Marchia Bond, MD;  Location: [facility];  Service: Orthopedics;  Laterality: Right;   TRANSTHORACIC ECHOCARDIOGRAM  06-02-2017   dr p. ross   mild LVH,  ef 55%, grade 2 diastoic dysfunction/  trivial MR and TR/  moderate LAE/  mild RAE    FAMILY HISTORY Family History  Problem Relation Age of Onset   Heart disease Mother        Heart Disease before age 86   Heart disease Father    Heart disease Sister        Heart Disease before age 64   Breast cancer Daughter 65    SOCIAL HISTORY Social History   Tobacco Use   Smoking status: Former Smoker    Packs/day: 0.25    Years: 32.00    Pack years: 8.00    Types: Cigarettes    Quit date: 02/20/2004    Years since quitting: 15.9   Smokeless tobacco: Never Used  Scientific laboratory technician Use: Never used  Substance Use Topics   Alcohol use: No   Drug use: Never         OPHTHALMIC EXAM:  Base Eye Exam    Visual Acuity (ETDRS)      Right Left   Dist [place] 20/400 20/40 +1   Dist ph [place] NI NI       Tonometry (Tonopen, 10:28 AM)      Right  Left   Pressure 17 12       Pupils      Pupils Dark Light Shape React APD   Right PERRL 3 3 Round Minimal None   Left PERRL 3 3 Round Minimal None       Visual Fields (Counting fingers)      Left Right    Full Full       Extraocular Movement      Right Left    Full Full       Neuro/Psych    Oriented x3: Yes   Mood/Affect: Normal       Dilation    Both eyes: 1.0% Mydriacyl, 2.5% Phenylephrine @ 10:31 AM        Slit Lamp and Fundus Exam    External Exam      Right Left   External Normal Normal       Slit Lamp Exam      Right Left   Lids/Lashes Normal Normal   Conjunctiva/Sclera White and quiet White and quiet   Cornea Clear Clear   Anterior Chamber Deep and quiet Deep and quiet   Iris Round and reactive Round and reactive   Lens Posterior chamber intraocular lens Posterior chamber intraocular lens   Anterior Vitreous Normal Normal       Fundus Exam      Right Left   Posterior Vitreous Posterior vitreous detachment Posterior vitreous detachment   Disc Normal Normal, calcific Hollenhorst plaque at the nerve inferior branch retinal artery bifurcation   C/D Ratio 0.3 0.25   Macula Pigmented atrophy Geographic atrophy, Retinal pigment epithelial mottling   Vessels Normal Retinal artery occlusion left eye inferiorly with cholesterol calcific embolus   Periphery , Peripheral, retina attached. ,, blot  hemorrhage inferiorly in 2 quadrants, Dot-blot hemorrhage          IMAGING AND PROCEDURES  Imaging and Procedures for 02/06/20  OCT, Retina - OU - Both Eyes       Right Eye Quality was good. Scan locations included subfoveal. Central Foveal Thickness: 371. Progression has been stable.   Left Eye Quality was good. Scan locations included subfoveal. Central Foveal Thickness: 244. Progression has been stable. Findings include abnormal foveal contour.   Notes Inferior retinal atrophy in the macular region left eye along the region of branch retinal artery  occlusion.  No change.  Signs of wet AMD yet significant atrophy of the outer retinal layers and evidence of pigment migration into the retina                  ASSESSMENT/PLAN:  Diabetes mellitus without complication ([place]) The patient has diabetes without any evidence of retinopathy. The patient advised to maintain good blood glucose control, excellent blood pressure control, and favorable levels of cholesterol, low density lipoprotein, and high density lipoproteins. Follow up in 1 year was recommended. Explained that fluctuations in visual acuity , or "out of focus", may result from large variations of blood sugar control.  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 Diabetes mellitus without complication (HCC)  R91.6     1.  2.  3.  Ophthalmic Meds Ordered this visit:  No orders of the defined types were placed in this encounter.      Return in about 3 months (around 05/07/2020) for DILATE OU, COLOR FP.  There are no Patient Instructions on file for this visit.   Explained the diagnoses, plan, and follow up with the patient and they expressed understanding.  Patient expressed understanding of the importance of proper follow up care.   Clent Demark Gregoire Bennis M.D. Diseases & Surgery of the Retina and Vitreous [facility]ina [facility] Diabetic [place] 02/06/20     Abbreviations: M myopia (nearsighted); A astigmatism; H hyperopia (farsighted); P presbyopia; Mrx spectacle prescription;  CTL contact lenses; OD right eye; OS left eye; OU both eyes  XT exotropia; ET esotropia; PEK punctate epithelial keratitis; PEE punctate epithelial erosions; DES dry eye syndrome; MGD meibomian gland dysfunction; ATs artificial tears; PFAT's preservative free artificial tears; [place] nuclear sclerotic cataract; PSC posterior subcapsular  cataract; ERM epi-retinal membrane; PVD posterior vitreous detachment; RD retinal detachment; DM diabetes mellitus; DR diabetic retinopathy; NPDR non-proliferative diabetic retinopathy; PDR proliferative diabetic retinopathy; CSME clinically significant macular edema; DME diabetic macular edema; dbh dot blot hemorrhages; CWS cotton wool spot; POAG primary open angle glaucoma; C/D cup-to-disc ratio; HVF humphrey visual field; GVF goldmann visual field; OCT optical coherence tomography; IOP intraocular pressure; BRVO Branch retinal vein occlusion; CRVO central retinal vein occlusion; CRAO central retinal artery occlusion; BRAO branch retinal artery occlusion; RT retinal tear; SB scleral buckle; PPV pars plana vitrectomy; VH Vitreous hemorrhage; PRP panretinal laser photocoagulation; IVK intravitreal kenalog; VMT vitreomacular traction; MH Macular hole;  NVD neovascularization of the disc; NVE neovascularization elsewhere; AREDS age related eye disease study; ARMD age related macular degeneration; POAG primary open angle glaucoma; EBMD epithelial/anterior basement membrane dystrophy; ACIOL anterior chamber intraocular lens; IOL intraocular lens; PCIOL posterior chamber intraocular lens; Phaco/IOL phacoemulsification with intraocular lens placement; PRK photorefractive keratectomy; LASIK laser assisted in  situ keratomileusis; HTN hypertension; DM diabetes mellitus; COPD chronic obstructive pulmonary disease

## 2020-02-06 NOTE — Assessment & Plan Note (Signed)
Signs of neovascular complication left eye will continue to observe

## 2020-02-27 ENCOUNTER — Other Ambulatory Visit: Payer: Self-pay | Admitting: Internal Medicine

## 2020-02-27 DIAGNOSIS — Z1231 Encounter for screening mammogram for malignant neoplasm of breast: Secondary | ICD-10-CM

## 2020-04-09 ENCOUNTER — Other Ambulatory Visit: Payer: Self-pay

## 2020-04-09 ENCOUNTER — Ambulatory Visit
Admission: RE | Admit: 2020-04-09 | Discharge: 2020-04-09 | Disposition: A | Discharge status: Home / Self Care | Payer: Medicare Other | Source: Ambulatory Visit | Attending: Internal Medicine | Admitting: Internal Medicine

## 2020-04-09 DIAGNOSIS — Z1231 Encounter for screening mammogram for malignant neoplasm of breast: Secondary | ICD-10-CM

## 2020-05-07 ENCOUNTER — Encounter (INDEPENDENT_AMBULATORY_CARE_PROVIDER_SITE_OTHER): Payer: Medicare Other | Admitting: Ophthalmology

## 2020-05-16 ENCOUNTER — Other Ambulatory Visit: Payer: Self-pay

## 2020-05-16 ENCOUNTER — Encounter (INDEPENDENT_AMBULATORY_CARE_PROVIDER_SITE_OTHER): Payer: Self-pay | Admitting: Ophthalmology

## 2020-05-16 ENCOUNTER — Ambulatory Visit (INDEPENDENT_AMBULATORY_CARE_PROVIDER_SITE_OTHER): Payer: Medicare Other | Admitting: Ophthalmology

## 2020-05-16 DIAGNOSIS — H353212 Exudative age-related macular degeneration, right eye, with inactive choroidal neovascularization: Secondary | ICD-10-CM | POA: Diagnosis not present

## 2020-05-16 DIAGNOSIS — H34212 Partial retinal artery occlusion, left eye: Secondary | ICD-10-CM

## 2020-05-16 DIAGNOSIS — H34232 Retinal artery branch occlusion, left eye: Secondary | ICD-10-CM | POA: Diagnosis not present

## 2020-05-16 DIAGNOSIS — E119 Type 2 diabetes mellitus without complications: Secondary | ICD-10-CM

## 2020-05-16 DIAGNOSIS — H353112 Nonexudative age-related macular degeneration, right eye, intermediate dry stage: Secondary | ICD-10-CM

## 2020-05-16 NOTE — Assessment & Plan Note (Signed)

## 2020-05-16 NOTE — Assessment & Plan Note (Signed)
Stable and no neovascular complications

## 2020-05-16 NOTE — Assessment & Plan Note (Signed)
No interval changes OD

## 2020-05-16 NOTE — Progress Notes (Signed)
05/16/2020     CHIEF COMPLAINT Patient presents for Retina Follow Up (3 MO FU OU////Pt reports still seeing "wavy lines" when looking at lines, no new F/F, no pain or pressure./////Last A1C:  6.5 taken 01/2020////Last BS: 112 this AM)   HISTORY OF PRESENT ILLNESS: Gloria [name] is a 79 y.o. female who presents to the clinic today for:   HPI    Retina Follow Up    Patient presents with  CRVO/BRVO.  In both eyes.  This started 3 months ago.  Duration of 3 months. Additional comments: 3 MO FU OU    Pt reports still seeing "wavy lines" when looking at lines, no new F/F, no pain or pressure.     Last A1C:  6.5 taken 01/2020    Last BS: 112 this AM       Last edited by Nichola Sizer D on 05/16/2020 10:34 AM. (History)      Referring physician: Haywood Pao, MD [place],  [place] [address]  HISTORICAL INFORMATION:   Selected notes from the MEDICAL RECORD NUMBER    Lab Results  Component Value Date   HGBA1C 7.1 (H) 02/23/2018     CURRENT MEDICATIONS: Current Outpatient Medications (Ophthalmic Drugs)  Medication Sig   carboxymethylcellulose (REFRESH PLUS) 0.5 % SOLN Place 1 drop into both eyes 3 (three) times daily as needed (for dry/irritated eyes.).    No current facility-administered medications for this visit. (Ophthalmic Drugs)   Current Outpatient Medications (Other)  Medication Sig   amiodarone (PACERONE) 200 MG tablet TAKE 1 TABLET BY MOUTH EVERY DAY   atorvastatin (LIPITOR) 80 MG tablet Take 80 mg by mouth every morning.    B-D 3CC LUER-LOK SYR 25GX5/8" 25G X 5/8" 3 ML MISC INJECT 1 ML INTO THE MUSCLE EVERY 30 (THIRTY) DAYS.   Cyanocobalamin (B-12 COMPLIANCE INJECTION) 1000 MCG/ML KIT Inject 1,000 mcg as directed every 14 (fourteen) days.   diltiazem (DILACOR XR) 180 MG 24 hr capsule Take 180 mg by mouth daily.   diphenhydrAMINE (BENADRYL) 25 MG tablet Take 25 mg by mouth at bedtime.    ELIQUIS 5 MG TABS tablet TAKE 1  TABLET BY MOUTH TWICE A DAY (Patient not taking: Reported on 09/01/2019)   fenofibrate 160 MG tablet Take 160 mg by mouth every morning.    ferrous sulfate 324 MG TBEC Take 324 mg by mouth.   hydrALAZINE (APRESOLINE) 25 MG tablet Take 25 mg by mouth daily.   hydrochlorothiazide (HYDRODIURIL) 25 MG tablet Take 1 tablet (25 mg total) by mouth 2 (two) times daily.   JANUVIA 50 MG tablet Take 50 mg by mouth every morning.    LANTUS SOLOSTAR 100 UNIT/ML Solostar Pen Inject 45 Units into the skin at bedtime. Per sliding scale   lidocaine (LIDODERM) 5 % Place 1 patch onto the skin daily as needed (for pain). Remove & Discard patch within 12 hours or as directed by MD   Menthol, Topical Analgesic, (BIOFREEZE EX) Apply 1 application topically 2 (two) times daily as needed (for pain).    metoprolol tartrate (LOPRESSOR) 25 MG tablet Take 0.5 tablets (12.5 mg total) by mouth 2 (two) times daily.   metroNIDAZOLE (FLAGYL) 500 MG tablet Take 500 mg by mouth 2 (two) times daily.   Needles & Syringes MISC 1 Units by Does not apply route every 30 (thirty) days.   omeprazole (PRILOSEC) 20 MG capsule Take 20 mg by mouth daily before breakfast.    promethazine (PHENERGAN) 12.5 MG tablet  Take 1 tablet (12.5 mg total) by mouth every 6 (six) hours as needed for nausea or vomiting.   sennosides-docusate sodium (SENOKOT-S) 8.6-50 MG tablet Take 2 tablets by mouth daily.   torsemide (DEMADEX) 20 MG tablet Take 20 mg by mouth in the morning, at noon, and at bedtime.   warfarin (COUMADIN) 2.5 MG tablet Take 2.5 mg by mouth daily. Tuesday, Wednesday, Thursday, Saturday, Sunday   warfarin (COUMADIN) 5 MG tablet Take 5 mg by mouth daily. Monday and Friday   No current facility-administered medications for this visit. (Other)      REVIEW OF SYSTEMS:    ALLERGIES Allergies  Allergen Reactions   Iodinated Diagnostic Agents Anaphylaxis   Iohexol Hives, Swelling and Other (See Comments)     Desc: ANGIO  EDEMA W/ ANAPHYLAXIS PRIOR... HIVES POST 13 HR PREP...OK/A.C.    Codeine Nausea And Vomiting    SEVERE   Oxycodone Nausea And Vomiting    SEVERE   Levaquin [Levofloxacin In D5w] Nausea Only and Other (See Comments)    dizziness   Propoxyphene N-Acetaminophen Nausea And Vomiting    PAST MEDICAL HISTORY Past Medical History:  Diagnosis Date   Anticoagulant long-term use    eliquis   B12 deficiency    Carotid stenosis, bilateral    followed by dr Trula Slade--- per last duplex 08-11-2017  patent right ICA,  left ICA 40-59%,  left CEA >50%   Chronic anemia    CKD (chronic kidney disease), stage III ([place])    Diabetic retinopathy ([place])    Diastolic CHF, chronic ([place])    Family history of adverse reaction to anesthesia    daughter - violent   GERD (gastroesophageal reflux disease)    Hyperlipidemia    Hypertension    Macular degeneration    Right eye only,  loss of peripheral vision   MGUS (monoclonal gammopathy of unknown significance)    followed by dr Lindi Adie ([facility])   OA (osteoarthritis)    knees, fingers, back   OSA on CPAP 11/05/2017   Severe sleep apnea during REM sleep with oxygen saturations dropping to 78%.  Due to severe nocturnal hypoxemia as well as significant sleep apnea during REM sleep she is now on CPAP at  11 cm H2O   PAF (paroxysmal atrial fibrillation) ([place]) 05/2017   [facility] cardiologist-  dr Dorris Carnes   Peripheral neuropathy    bilateral feet post back surgery   Peripheral vascular disease ([place])    hx rt carotid s/p cea   PONV (postoperative nausea and vomiting)    Primary localized osteoarthritis of right knee 03/02/2018   Type 2 diabetes mellitus treated with insulin ([place])    followed by pcp   Past Surgical History:  Procedure Laterality Date   CARDIAC CATHETERIZATION  05-27-2005   dr Doreatha Lew   mild to moderate coronary atherosclerosis, 60% ostial narrowing bifurcation OM and 60% D2 w/ minor irregularities otherwise  ,  normal LVF   CARDIOVASCULAR STRESS TEST  02-26-2012    dr Aundra Dubin   normal dobutamin nuclera study w/ no ischemia/  normal LV funciton and wall motion , ef 66%   CARDIOVERSION N/A 06/07/2017   Procedure: CARDIOVERSION;  Surgeon: Thompson Grayer, MD;  Location: [facility];  Service: Cardiovascular;  Laterality: N/A;   CAROTID ENDARTERECTOMY Right 06-06-2005   dr Amedeo Plenty _0    CATARACT EXTRACTION Right 08/20/2016   Dr. Katy Fitch   CATARACT EXTRACTION Left 10/11/2016   Dr. Katy Fitch   CATARACT EXTRACTION W/ INTRAOCULAR LENS  IMPLANT,  BILATERAL  2018   CHOLECYSTECTOMY OPEN  1968   AND APPENDECTOMY   EYE SURGERY Right 2017    Retinal tear-    LUMBAR LAMINECTOMY/DECOMPRESSION MICRODISCECTOMY  02/27/2012   Procedure: LUMBAR LAMINECTOMY/DECOMPRESSION MICRODISCECTOMY 2 LEVELS;  Surgeon: Kristeen Miss, MD;  Location: [facility];  Service: Neurosurgery;  Laterality: N/A;  Lumbar two-three, four-five Laminectomy/foraminotomy   TONSILLECTOMY  AGE 81   TOTAL KNEE ARTHROPLASTY Right 03/02/2018   Procedure: RIGHT TOTAL KNEE ARTHROPLASTY;  Surgeon: Marchia Bond, MD;  Location: [facility];  Service: Orthopedics;  Laterality: Right;   TRANSTHORACIC ECHOCARDIOGRAM  06-02-2017   dr p. ross   mild LVH,  ef 55%, grade 2 diastoic dysfunction/  trivial MR and TR/  moderate LAE/  mild RAE    FAMILY HISTORY Family History  Problem Relation Age of Onset   Heart disease Mother        Heart Disease before age 29   Heart disease Father    Heart disease Sister        Heart Disease before age 57   Breast cancer Daughter 58    SOCIAL HISTORY Social History   Tobacco Use   Smoking status: Former Smoker    Packs/day: 0.25    Years: 32.00    Pack years: 8.00    Types: Cigarettes    Quit date: 02/20/2004    Years since quitting: 16.2   Smokeless tobacco: Never Used  Scientific laboratory technician Use: Never used  Substance Use Topics   Alcohol use: No   Drug use: Never         OPHTHALMIC EXAM:  Base  Eye Exam    Visual Acuity (ETDRS)      Right Left   Dist [place] 20/400 20/40 +2   Dist ph [place] NI 20/30 -2   Correction: Glasses       Tonometry (Tonopen, 10:42 AM)      Right Left   Pressure 15 14       Pupils      Pupils Dark Light Shape React APD   Right PERRL 3 3 Round Minimal None   Left PERRL 3 3 Round Minimal None       Visual Fields (Counting fingers)      Left Right    Full Full       Extraocular Movement      Right Left    Full Full       Neuro/Psych    Oriented x3: Yes   Mood/Affect: Normal       Dilation    Both eyes: 1.0% Mydriacyl, 2.5% Phenylephrine @ 10:43 AM        Slit Lamp and Fundus Exam    External Exam      Right Left   External Normal Normal       Slit Lamp Exam      Right Left   Lids/Lashes Normal Normal   Conjunctiva/Sclera White and quiet White and quiet   Cornea Clear Clear   Anterior Chamber Deep and quiet Deep and quiet   Iris Round and reactive Round and reactive   Lens Posterior chamber intraocular lens Posterior chamber intraocular lens   Anterior Vitreous Normal Normal       Fundus Exam      Right Left   Posterior Vitreous Posterior vitreous detachment Posterior vitreous detachment   Disc Normal Normal, calcific Hollenhorst plaque at the nerve inferior branch retinal artery bifurcation   C/D Ratio 0.3 0.25  Macula Pigmented atrophy, Disciform scar Geographic atrophy, Retinal pigment epithelial mottling   Vessels Normal Retinal artery occlusion left eye inferiorly with cholesterol calcific embolus   Periphery , Peripheral, retina attached. ,, blot hemorrhage inferiorly in 2 quadrants, Dot-blot hemorrhage          IMAGING AND PROCEDURES  Imaging and Procedures for 05/16/20  Color Fundus Photography Optos - OU - Both Eyes       Right Eye Progression has been stable. Disc findings include normal observations. Macula : normal observations. Periphery : normal observations.   Left Eye Progression has worsened. Disc  findings include normal observations. Macula : normal observations. Periphery : normal observations.   Notes Calcific intra-arterial Hollenhorst plaque at the nerve affecting the inferior branch retinal artery OS with inferotemporal retinal whitening and inner retinal infarction along inferotemporal arcade, no NV  OD, with chronic old unchanged disc a form macular scar                ASSESSMENT/PLAN:  Hollenhorst plaque, left eye Location of calcific plaque at the optic nerve inferior branch retinal artery, patient is on Coumadin which is likely to prevent propagation proximally of a clot which could affect the central retinal artery.  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 Checking an Amsler Grid daily with advice to return immediately should a distortion develop, was given to the patient. The patient 's smoking status now and in the past was determined and advice based on the AREDS study was provided regarding the consumption of antioxidant supplements. AREDS 2 vitamin formulation was recommended. Consumption of dark leafy vegetables and fresh fruits of various colors was recommended. Treatment modalities for wet macular degeneration particularly the use of intravitreal injections of anti-blood vessel growth factors was discussed with the patient. Avastin, Lucentis, and Eylea are the available options. On occasion, therapy includes the use of photodynamic therapy and thermal laser. Stressed to the patient do not rub eyes.  Patient was advised to check Amsler Grid daily and return immediately if changes are  noted. Instructions on using the grid were given to the patient. All patient questions were answered.      ICD-10-CM   1. Diabetes mellitus without complication ([place])  X54.0 Color Fundus Photography Optos - OU - Both Eyes  2. Hollenhorst plaque, left eye  H34.212   3. Exudative age-related macular degeneration of right eye with inactive choroidal neovascularization ([place])  H35.3212   4. 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 Intermediate stage nonexudative age-related macular degeneration of right eye  H35.3112     1. OS, no complications of Hollenhorst plaque induced inferotemporal branch retinal artery occlusion, no neovascularization. Patient doing well with new spectacle correction.  2. Dilate OU next  3.  Ophthalmic Meds Ordered this visit:  No orders of the defined types were placed in this encounter.      Return in about 6 months (around 11/14/2020) for DILATE OU, COLOR FP, OCT.  There are no Patient Instructions on file for this visit.   Explained the diagnoses, plan, and follow up with the patient and they expressed understanding.  Patient expressed understanding of the importance of proper follow  up care.   Clent Demark Lux Meaders M.D. Diseases & Surgery of the Retina and Vitreous Retina & Diabetic [place] 05/16/20     Abbreviations: M myopia (nearsighted); A astigmatism; H hyperopia (farsighted); P presbyopia; Mrx spectacle prescription;  CTL contact lenses; OD right eye; OS left eye; OU both eyes  XT exotropia; ET esotropia; PEK punctate epithelial keratitis; PEE punctate epithelial erosions; DES dry eye syndrome; MGD meibomian gland dysfunction; ATs artificial tears; PFAT's preservative free artificial tears; [place] nuclear sclerotic cataract; PSC posterior subcapsular cataract; ERM epi-retinal membrane; PVD posterior vitreous detachment; RD retinal detachment; DM diabetes mellitus; DR diabetic retinopathy; NPDR non-proliferative diabetic retinopathy; PDR  proliferative diabetic retinopathy; CSME clinically significant macular edema; DME diabetic macular edema; dbh dot blot hemorrhages; CWS cotton wool spot; POAG primary open angle glaucoma; C/D cup-to-disc ratio; HVF humphrey visual field; GVF goldmann visual field; OCT optical coherence tomography; IOP intraocular pressure; BRVO Branch retinal vein occlusion; CRVO central retinal vein occlusion; CRAO central retinal artery occlusion; BRAO branch retinal artery occlusion; RT retinal tear; SB scleral buckle; PPV pars plana vitrectomy; VH Vitreous hemorrhage; PRP panretinal laser photocoagulation; IVK intravitreal kenalog; VMT vitreomacular traction; MH Macular hole;  NVD neovascularization of the disc; NVE neovascularization elsewhere; AREDS age related eye disease study; ARMD age related macular degeneration; POAG primary open angle glaucoma; EBMD epithelial/anterior basement membrane dystrophy; ACIOL anterior chamber intraocular lens; IOL intraocular lens; PCIOL posterior chamber intraocular lens; Phaco/IOL phacoemulsification with intraocular lens placement; [place] photorefractive keratectomy; LASIK laser assisted in situ keratomileusis; HTN hypertension; DM diabetes mellitus; COPD chronic obstructive pulmonary disease

## 2020-05-16 NOTE — Assessment & Plan Note (Signed)
Location of calcific plaque at the optic nerve inferior branch retinal artery, patient is on Coumadin which is likely to prevent propagation proximally of a clot which could affect the central retinal artery.

## 2020-10-03 NOTE — Assessment & Plan Note (Deleted)
Lab review of7/05/2017: Hemoglobin 10.7, MCV 92 previously. B12 95 [id number]: M protein: 0.6g IgM lambda 02/15/2018: M protein: 0.3 g, IgM lambda,lambda: 31.6, kappa: 57.9, ratio 1.83, creatinine 1.6, hemoglobin 10 05/17/2018: Hemoglobin 10 09/10/2018: Hemoglobin 9.5, MCV 87, ferritin 46, beta-2 microglobulin 5.8 SPEPM protein 0.5 g, IgM 957, kappa 62, lambda 42, ratio 1.46, iron saturation 15%, ferritin 46, beta-2 microglobulin 5.8  Current treatment: B12 injections monthly at home  Lab review done at [facility] 03/18/2019: WBC 12.3, hemoglobin 11, MCV 83, ANC 9.4, creatinine 1.58, M spike 0.4 g (previously was 0.5 g) IgM 944 (previously was 957), beta-2 microglobulin 4.9

## 2020-10-04 ENCOUNTER — Inpatient Hospital Stay: Payer: Medicare Other | Admitting: Hematology and Oncology

## 2020-10-04 DIAGNOSIS — D472 Monoclonal gammopathy: Secondary | ICD-10-CM

## 2020-10-04 NOTE — Progress Notes (Incomplete)
Patient Care Team: Tisovec, Fransico Him, MD as PCP - General (Internal Medicine) Fay Records, MD as PCP - Cardiology (Cardiology) Marchia Bond, MD as Consulting Physician (Orthopedic Surgery)  DIAGNOSIS:    ICD-10-CM   1. MGUS (monoclonal gammopathy of unknown significance)  D47.2     CHIEF COMPLIANT: MGUS, anemia of chronic kidney disease, and B12 deficiency   INTERVAL HISTORY: Gloria [name] is a 80 y.o. with above-mentioned history of MGUS, B-12 deficiency, and anemia of chronic kidney disease.She presents to the clinic todayfor follow-up.   ALLERGIES:  is allergic to iodinated diagnostic agents, iohexol, codeine, oxycodone, levaquin [levofloxacin in d5w], and propoxyphene n-acetaminophen.  MEDICATIONS:  Current Outpatient Medications  Medication Sig Dispense Refill  . amiodarone (PACERONE) 200 MG tablet TAKE 1 TABLET BY MOUTH EVERY DAY 90 tablet 3  . atorvastatin (LIPITOR) 80 MG tablet Take 80 mg by mouth every morning.     . B-D 3CC LUER-LOK SYR 25GX5/8" 25G X 5/8" 3 ML MISC INJECT 1 ML INTO THE MUSCLE EVERY 30 (THIRTY) DAYS. 3 each 0  . carboxymethylcellulose (REFRESH PLUS) 0.5 % SOLN Place 1 drop into both eyes 3 (three) times daily as needed (for dry/irritated eyes.).     Marland Kitchen Cyanocobalamin (B-12 COMPLIANCE INJECTION) 1000 MCG/ML KIT Inject 1,000 mcg as directed every 14 (fourteen) days.    Marland Kitchen diltiazem (DILACOR XR) 180 MG 24 hr capsule Take 180 mg by mouth daily.    . diphenhydrAMINE (BENADRYL) 25 MG tablet Take 25 mg by mouth at bedtime.     Marland Kitchen ELIQUIS 5 MG TABS tablet TAKE 1 TABLET BY MOUTH TWICE A DAY (Patient not taking: Reported on 09/01/2019) 60 tablet 5  . fenofibrate 160 MG tablet Take 160 mg by mouth every morning.     . ferrous sulfate 324 MG TBEC Take 324 mg by mouth.    . hydrALAZINE (APRESOLINE) 25 MG tablet Take 25 mg by mouth daily.    . hydrochlorothiazide (HYDRODIURIL) 25 MG tablet Take 1 tablet (25 mg total) by mouth 2 (two) times daily. 180 tablet 3   . JANUVIA 50 MG tablet Take 50 mg by mouth every morning.   1  . LANTUS SOLOSTAR 100 UNIT/ML Solostar Pen Inject 45 Units into the skin at bedtime. Per sliding scale    . lidocaine (LIDODERM) 5 % Place 1 patch onto the skin daily as needed (for pain). Remove & Discard patch within 12 hours or as directed by MD    . Menthol, Topical Analgesic, (BIOFREEZE EX) Apply 1 application topically 2 (two) times daily as needed (for pain).     . metoprolol tartrate (LOPRESSOR) 25 MG tablet Take 0.5 tablets (12.5 mg total) by mouth 2 (two) times daily. 90 tablet 3  . metroNIDAZOLE (FLAGYL) 500 MG tablet Take 500 mg by mouth 2 (two) times daily.    . Needles & Syringes MISC 1 Units by Does not apply route every 30 (thirty) days.  0  . omeprazole (PRILOSEC) 20 MG capsule Take 20 mg by mouth daily before breakfast.     . promethazine (PHENERGAN) 12.5 MG tablet Take 1 tablet (12.5 mg total) by mouth every 6 (six) hours as needed for nausea or vomiting. 30 tablet 0  . sennosides-docusate sodium (SENOKOT-S) 8.6-50 MG tablet Take 2 tablets by mouth daily. 30 tablet 1  . torsemide (DEMADEX) 20 MG tablet Take 20 mg by mouth in the morning, at noon, and at bedtime.    Marland Kitchen warfarin (COUMADIN) 2.5 MG tablet Take  2.5 mg by mouth daily. Tuesday, Wednesday, Thursday, Saturday, Sunday    . warfarin (COUMADIN) 5 MG tablet Take 5 mg by mouth daily. Monday and Friday     No current facility-administered medications for this visit.    PHYSICAL EXAMINATION: ECOG PERFORMANCE STATUS: {CHL ONC ECOG PS:[id number]}  There were no vitals filed for this visit.  LABORATORY DATA:  I have reviewed the data as listed CMP Latest Ref Rng & Units 04/30/2018 03/25/2018 03/04/2018  Glucose 65 - 99 mg/dL 98 95 209(H)  BUN 8 - 27 mg/dL 32(H) 23 50(H)  Creatinine 0.57 - 1.00 mg/dL 1.46(H) 1.33(H) 2.15(H)  Sodium 134 - 144 mmol/L 142 142 138  Potassium 3.5 - 5.2 mmol/L 4.3 4.6 4.9  Chloride 96 - 106 mmol/L 100 101 107  CO2 20 - 29 mmol/L  25 26 20(L)  Calcium 8.7 - 10.3 mg/dL 9.3 9.0 8.6(L)  Total Protein 6.5 - 8.1 g/dL - - -  Total Bilirubin 0.3 - 1.2 mg/dL - - -  Alkaline Phos 38 - 126 U/L - - -  AST 15 - 41 U/L - - -  ALT 0 - 44 U/L - - -    Lab Results  Component Value Date   WBC 11.1 (H) 05/17/2018   HGB 10.0 (L) 05/17/2018   HCT 33.9 (L) 05/17/2018   MCV 89.7 05/17/2018   PLT 244 05/17/2018   NEUTROABS 7.4 05/17/2018    ASSESSMENT & PLAN:  No problem-specific Assessment & Plan notes found for this encounter.    No orders of the defined types were placed in this encounter.  The patient has a good understanding of the overall plan. she agrees with it. she will call with any problems that may develop before the next visit here.  Total time spent: *** mins including face to face time and time spent for planning, charting and coordination of care  Vinay K Gudena, MD, MPH 10/04/2020  I, Molly Dorshimer, am acting as scribe for Dr. Vinay Gudena.  {insert scribe attestation}      

## 2020-11-14 ENCOUNTER — Ambulatory Visit (INDEPENDENT_AMBULATORY_CARE_PROVIDER_SITE_OTHER): Payer: Medicare Other | Admitting: Ophthalmology

## 2020-11-14 ENCOUNTER — Other Ambulatory Visit: Payer: Self-pay

## 2020-11-14 ENCOUNTER — Encounter (INDEPENDENT_AMBULATORY_CARE_PROVIDER_SITE_OTHER): Payer: Self-pay | Admitting: Ophthalmology

## 2020-11-14 DIAGNOSIS — H34232 Retinal artery branch occlusion, left eye: Secondary | ICD-10-CM

## 2020-11-14 DIAGNOSIS — H353212 Exudative age-related macular degeneration, right eye, with inactive choroidal neovascularization: Secondary | ICD-10-CM

## 2020-11-14 DIAGNOSIS — H353132 Nonexudative age-related macular degeneration, bilateral, intermediate dry stage: Secondary | ICD-10-CM | POA: Diagnosis not present

## 2020-11-14 DIAGNOSIS — Z9889 Other specified postprocedural states: Secondary | ICD-10-CM

## 2020-11-14 DIAGNOSIS — H353121 Nonexudative age-related macular degeneration, left eye, early dry stage: Secondary | ICD-10-CM | POA: Diagnosis not present

## 2020-11-14 DIAGNOSIS — Z8669 Personal history of other diseases of the nervous system and sense organs: Secondary | ICD-10-CM

## 2020-11-14 NOTE — Progress Notes (Signed)
11/14/2020     CHIEF COMPLAINT Patient presents for Retina Follow Up (6 month fu OU and OCT/FP/Pt states, "I think my vision may be a little worse. It is definitely more wavy than it was in both eyes."/A1C: 6.2/FBS: 118/Pt continues on AREDS daily)   HISTORY OF PRESENT ILLNESS: Gloria [name] is a 80 y.o. female who presents to the clinic today for:   HPI     Retina Follow Up           Diagnosis: CRVO/BRVO   Laterality: both eyes   Onset: 6 months ago   Severity: mild   Duration: 6 months   Course: gradually worsening   Comments: 6 month fu OU and OCT/FP Pt states, "I think my vision may be a little worse. It is definitely more wavy than it was in both eyes." A1C: 6.2 FBS: 118 Pt continues on AREDS daily       Last edited by Kendra Opitz, COA on 11/14/2020 10:47 AM.      Referring physician: Haywood Pao, MD [place],  [place] [id number]  HISTORICAL INFORMATION:   Selected notes from the MEDICAL RECORD NUMBER    Lab Results  Component Value Date   HGBA1C 7.1 (H) 02/23/2018     CURRENT MEDICATIONS: Current Outpatient Medications (Ophthalmic Drugs)  Medication Sig   carboxymethylcellulose (REFRESH PLUS) 0.5 % SOLN Place 1 drop into both eyes 3 (three) times daily as needed (for dry/irritated eyes.).    No current facility-administered medications for this visit. (Ophthalmic Drugs)   Current Outpatient Medications (Other)  Medication Sig   amiodarone (PACERONE) 200 MG tablet TAKE 1 TABLET BY MOUTH EVERY DAY   atorvastatin (LIPITOR) 80 MG tablet Take 80 mg by mouth every morning.    B-D 3CC LUER-LOK SYR 25GX5/8" 25G X 5/8" 3 ML MISC INJECT 1 ML INTO THE MUSCLE EVERY 30 (THIRTY) DAYS.   Cyanocobalamin (B-12 COMPLIANCE INJECTION) 1000 MCG/ML KIT Inject 1,000 mcg as directed every 14 (fourteen) days.   diltiazem (DILACOR XR) 180 MG 24 hr capsule Take 180 mg by mouth daily.   diphenhydrAMINE (BENADRYL) 25 MG tablet Take 25 mg by mouth at  bedtime.    ELIQUIS 5 MG TABS tablet TAKE 1 TABLET BY MOUTH TWICE A DAY (Patient not taking: Reported on 09/01/2019)   fenofibrate 160 MG tablet Take 160 mg by mouth every morning.    ferrous sulfate 324 MG TBEC Take 324 mg by mouth.   hydrALAZINE (APRESOLINE) 25 MG tablet Take 25 mg by mouth daily.   hydrochlorothiazide (HYDRODIURIL) 25 MG tablet Take 1 tablet (25 mg total) by mouth 2 (two) times daily.   JANUVIA 50 MG tablet Take 50 mg by mouth every morning.    LANTUS SOLOSTAR 100 UNIT/ML Solostar Pen Inject 45 Units into the skin at bedtime. Per sliding scale   lidocaine (LIDODERM) 5 % Place 1 patch onto the skin daily as needed (for pain). Remove & Discard patch within 12 hours or as directed by MD   Menthol, Topical Analgesic, (BIOFREEZE EX) Apply 1 application topically 2 (two) times daily as needed (for pain).    metoprolol tartrate (LOPRESSOR) 25 MG tablet Take 0.5 tablets (12.5 mg total) by mouth 2 (two) times daily.   metroNIDAZOLE (FLAGYL) 500 MG tablet Take 500 mg by mouth 2 (two) times daily.   Needles & Syringes MISC 1 Units by Does not apply route every 30 (thirty) days.   omeprazole (PRILOSEC) 20 MG capsule Take  20 mg by mouth daily before breakfast.    promethazine (PHENERGAN) 12.5 MG tablet Take 1 tablet (12.5 mg total) by mouth every 6 (six) hours as needed for nausea or vomiting.   sennosides-docusate sodium (SENOKOT-S) 8.6-50 MG tablet Take 2 tablets by mouth daily.   torsemide (DEMADEX) 20 MG tablet Take 20 mg by mouth in the morning, at noon, and at bedtime.   warfarin (COUMADIN) 2.5 MG tablet Take 2.5 mg by mouth daily. Tuesday, Wednesday, Thursday, Saturday, Sunday   warfarin (COUMADIN) 5 MG tablet Take 5 mg by mouth daily. Monday and Friday   No current facility-administered medications for this visit. (Other)      REVIEW OF SYSTEMS:    ALLERGIES Allergies  Allergen Reactions   Iodinated Diagnostic Agents Anaphylaxis   Iohexol Hives, Swelling and Other (See  Comments)     Desc: ANGIO EDEMA W/ ANAPHYLAXIS PRIOR... HIVES POST 13 HR PREP...OK/A.C.    Codeine Nausea And Vomiting    SEVERE   Oxycodone Nausea And Vomiting    SEVERE   Levaquin [Levofloxacin In D5w] Nausea Only and Other (See Comments)    dizziness   Propoxyphene N-Acetaminophen Nausea And Vomiting    PAST MEDICAL HISTORY Past Medical History:  Diagnosis Date   Anticoagulant long-term use    eliquis   B12 deficiency    Carotid stenosis, bilateral    followed by dr Trula Slade--- per last duplex 08-11-2017  patent right ICA,  left ICA 40-59%,  left CEA >50%   Chronic anemia    CKD (chronic kidney disease), stage III ([place])    Diabetic retinopathy ([place])    Diastolic CHF, chronic ([place])    Family history of adverse reaction to anesthesia    daughter - violent   GERD (gastroesophageal reflux disease)    Hyperlipidemia    Hypertension    Macular degeneration    Right eye only,  loss of peripheral vision   MGUS (monoclonal gammopathy of unknown significance)    followed by dr Lindi Adie ([facility])   OA (osteoarthritis)    knees, fingers, back   OSA on CPAP 11/05/2017   Severe sleep apnea during REM sleep with oxygen saturations dropping to 78%.  Due to severe nocturnal hypoxemia as well as significant sleep apnea during REM sleep she is now on CPAP at  11 cm H2O   PAF (paroxysmal atrial fibrillation) ([place]) 05/2017   [facility] cardiologist-  dr Dorris Carnes   Peripheral neuropathy    bilateral feet post back surgery   Peripheral vascular disease ([place])    hx rt carotid s/p cea   PONV (postoperative nausea and vomiting)    Primary localized osteoarthritis of right knee 03/02/2018   Type 2 diabetes mellitus treated with insulin ([place])    followed by pcp   Past Surgical History:  Procedure Laterality Date   CARDIAC CATHETERIZATION  05-27-2005   dr Doreatha Lew   mild to moderate coronary atherosclerosis, 60% ostial narrowing bifurcation OM and 60% D2 w/ minor irregularities  otherwise ,  normal LVF   CARDIOVASCULAR STRESS TEST  02-26-2012    dr Aundra Dubin   normal dobutamin nuclera study w/ no ischemia/  normal LV funciton and wall motion , ef 66%   CARDIOVERSION N/A 06/07/2017   Procedure: CARDIOVERSION;  Surgeon: Thompson Grayer, MD;  Location: [facility];  Service: Cardiovascular;  Laterality: N/A;   CAROTID ENDARTERECTOMY Right 06-06-2005   dr Amedeo Plenty '@[facility]'    CATARACT EXTRACTION Right 08/20/2016   Dr. Katy Fitch   CATARACT EXTRACTION  Left 10/11/2016   Dr. Katy Fitch   CATARACT EXTRACTION W/ INTRAOCULAR LENS  IMPLANT, BILATERAL  2018   CHOLECYSTECTOMY OPEN  1968   AND APPENDECTOMY   EYE SURGERY Right 2017    Retinal tear-    LUMBAR LAMINECTOMY/DECOMPRESSION MICRODISCECTOMY  02/27/2012   Procedure: LUMBAR LAMINECTOMY/DECOMPRESSION MICRODISCECTOMY 2 LEVELS;  Surgeon: Kristeen Miss, MD;  Location: [facility];  Service: Neurosurgery;  Laterality: N/A;  Lumbar two-three, four-five Laminectomy/foraminotomy   TONSILLECTOMY  AGE 32   TOTAL KNEE ARTHROPLASTY Right 03/02/2018   Procedure: RIGHT TOTAL KNEE ARTHROPLASTY;  Surgeon: Marchia Bond, MD;  Location: [facility];  Service: Orthopedics;  Laterality: Right;   TRANSTHORACIC ECHOCARDIOGRAM  06-02-2017   dr p. ross   mild LVH,  ef 55%, grade 2 diastoic dysfunction/  trivial MR and TR/  moderate LAE/  mild RAE    FAMILY HISTORY Family History  Problem Relation Age of Onset   Heart disease Mother        Heart Disease before age 62   Heart disease Father    Heart disease Sister        Heart Disease before age 47   Breast cancer Daughter 37    SOCIAL HISTORY Social History   Tobacco Use   Smoking status: Former    Packs/day: 0.25    Years: 32.00    Pack years: 8.00    Types: Cigarettes    Quit date: 02/20/2004    Years since quitting: 16.7   Smokeless tobacco: Never  Vaping Use   Vaping Use: Never used  Substance Use Topics   Alcohol use: No   Drug use: Never         OPHTHALMIC EXAM:  Base Eye Exam     Visual  Acuity (ETDRS)       Right Left   Dist [place] CF at 3' 20/50 -2   Dist ph [place]  NI         Tonometry (Tonopen, 10:50 AM)       Right Left   Pressure 12 10         Pupils       Pupils Dark Light Shape React APD   Right PERRL 3 3 Round Minimal None   Left PERRL 3 3 Round Minimal None         Visual Fields (Counting fingers)       Left Right    Full Full         Extraocular Movement       Right Left    Full Full         Neuro/Psych     Oriented x3: Yes   Mood/Affect: Normal         Dilation     Both eyes: 1.0% Mydriacyl, 2.5% Phenylephrine @ 10:50 AM           Slit Lamp and Fundus Exam     External Exam       Right Left   External Normal Normal         Slit Lamp Exam       Right Left   Lids/Lashes Normal Normal   Conjunctiva/Sclera White and quiet White and quiet   Cornea Clear Clear   Anterior Chamber Deep and quiet Deep and quiet   Iris Round and reactive Round and reactive   Lens Posterior chamber intraocular lens Posterior chamber intraocular lens   Anterior Vitreous Normal Normal         Fundus Exam  Right Left   Posterior Vitreous Posterior vitreous detachment Posterior vitreous detachment   Disc Normal Normal, calcific Hollenhorst plaque at the nerve inferior branch retinal artery bifurcation   C/D Ratio 0.3 0.25   Macula Pigmented atrophy, Disciform scar Geographic atrophy, Retinal pigment epithelial mottling   Vessels Normal Retinal artery occlusion left eye inferiorly with cholesterol calcific embolus   Periphery , Peripheral, retina attached. ,, blot hemorrhage inferiorly in 2 quadrants, Dot-blot hemorrhage            IMAGING AND PROCEDURES  Imaging and Procedures for 11/14/20  OCT, Retina - OU - Both Eyes       Right Eye Quality was good. Scan locations included subfoveal. Central Foveal Thickness: 551. Progression has been stable. Findings include abnormal foveal contour.   Left Eye Quality was good.  Scan locations included subfoveal. Central Foveal Thickness: 251. Progression has been stable. Findings include abnormal foveal contour.   Notes Inferior retinal atrophy in the macular region left eye along the region of branch retinal artery occlusion.  No change.  Signs of wet AMD yet significant atrophy of the outer retinal layers and evidence of pigment migration into the retina  OD with massive disciform scar otherwise no change over time       Color Fundus Photography Optos - OU - Both Eyes       Right Eye Progression has been stable. Disc findings include normal observations. Macula : normal observations. Periphery : normal observations.   Left Eye Progression has worsened. Disc findings include normal observations. Macula : normal observations. Periphery : normal observations.   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  Patient remains on Coumadin which is a beneficial effect because of the risk of propagation of thrombotic episode occurs off of Coumadin for the remainder of the site in the left eye.  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 Checking an Amsler Grid daily with advice to return immediately should a  distortion develop, was given to the patient. The patient 's smoking status now and in the past was determined and advice based on the AREDS study was provided regarding the consumption of antioxidant supplements. AREDS 2 vitamin formulation was recommended. Consumption of dark leafy vegetables and fresh fruits of various colors was recommended. Treatment modalities for wet macular degeneration particularly the use of intravitreal injections of anti-blood vessel growth factors was discussed with the patient. Avastin, Lucentis, and Eylea are the available options. On occasion, therapy includes the use of photodynamic therapy and thermal laser. Stressed to the patient do not rub eyes.  Patient was advised to check Amsler Grid daily and return immediately if changes are noted. Instructions on using the grid were given to the patient. All patient questions were answered.      ICD-10-CM  1. Exudative age-related macular degeneration of right eye with inactive choroidal neovascularization ([place])  H35.3212     2. Intermediate stage nonexudative age-related macular degeneration of both eyes  H35.3132 OCT, Retina - OU - Both Eyes    3. 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 History of detached retina repair  Z98.890    Z86.69       1.  OS no signs of progression of inferior branch retinal artery occlusion.  Preserved acuity with intact superior temporal retinal artery circulation no signs of other complications including no AMD progression OS  2.  OD with massive disciform scar  3.  OD past history of retinal detachment, retina detached peripheral  Ophthalmic Meds Ordered this visit:  No orders of the defined types were placed in this encounter.      Return in about 6 months (around 05/16/2021) for DILATE OU,  COLOR FP, OCT.  There are no Patient Instructions on file for this visit.   Explained the diagnoses, plan, and follow up with the patient and they expressed understanding.  Patient expressed understanding of the importance of proper follow up care.   Clent Demark Rynell Ciotti M.D. Diseases & Surgery of the Retina and Vitreous [facility]ina [facility] Diabetic [place] 11/14/20     Abbreviations: M myopia (nearsighted); A astigmatism; H hyperopia (farsighted); P presbyopia; Mrx spectacle prescription;  CTL contact lenses; OD right eye; OS left eye; OU both eyes  XT exotropia; ET esotropia; PEK punctate epithelial keratitis; PEE punctate epithelial erosions; DES dry eye syndrome; MGD meibomian gland dysfunction; ATs artificial tears; PFAT's preservative free artificial tears; [place] nuclear sclerotic cataract; PSC posterior subcapsular cataract; ERM epi-retinal membrane; PVD posterior vitreous detachment; RD retinal detachment; DM diabetes mellitus; DR diabetic retinopathy; NPDR non-proliferative diabetic retinopathy; PDR proliferative diabetic retinopathy; CSME clinically significant macular edema; DME diabetic macular edema; dbh dot blot hemorrhages; CWS cotton wool spot; POAG primary open angle glaucoma; C/D cup-to-disc ratio; HVF humphrey visual field; GVF goldmann visual field; OCT optical coherence tomography; IOP intraocular pressure; BRVO Branch retinal vein occlusion; CRVO central retinal vein occlusion; CRAO central retinal artery occlusion; BRAO branch retinal artery occlusion; RT retinal tear; SB scleral buckle; PPV pars plana vitrectomy; VH Vitreous hemorrhage; PRP panretinal laser photocoagulation; IVK intravitreal kenalog; VMT vitreomacular traction; MH Macular hole;  NVD neovascularization of the disc; NVE neovascularization elsewhere; AREDS age related eye disease study; ARMD age related macular degeneration; POAG primary open angle glaucoma; EBMD epithelial/anterior basement membrane dystrophy; ACIOL anterior  chamber intraocular lens; IOL intraocular lens; PCIOL posterior chamber intraocular lens; Phaco/IOL phacoemulsification with intraocular lens placement; [place] photorefractive keratectomy; LASIK laser assisted in situ keratomileusis; HTN hypertension; DM diabetes mellitus; COPD chronic obstructive pulmonary disease

## 2020-11-14 NOTE — Assessment & Plan Note (Signed)
Stable calcific plaque at the inferior branch the central retinal artery, no progression.  Patient remains on Coumadin which is a beneficial effect because of the risk of propagation of thrombotic episode occurs off of Coumadin for the remainder of the site in the left eye.

## 2020-11-14 NOTE — Assessment & Plan Note (Signed)

## 2020-11-14 NOTE — Assessment & Plan Note (Signed)
No signs of of CNVM that is treatable OD

## 2021-04-25 ENCOUNTER — Other Ambulatory Visit: Payer: Self-pay | Admitting: Internal Medicine

## 2021-04-25 DIAGNOSIS — Z1231 Encounter for screening mammogram for malignant neoplasm of breast: Secondary | ICD-10-CM

## 2021-05-16 ENCOUNTER — Encounter (INDEPENDENT_AMBULATORY_CARE_PROVIDER_SITE_OTHER): Payer: Medicare Other | Admitting: Ophthalmology

## 2021-06-05 ENCOUNTER — Ambulatory Visit
Admission: RE | Admit: 2021-06-05 | Discharge: 2021-06-05 | Disposition: A | Discharge status: Home / Self Care | Payer: Medicare Other | Source: Ambulatory Visit | Attending: Internal Medicine | Admitting: Internal Medicine

## 2021-06-05 DIAGNOSIS — Z1231 Encounter for screening mammogram for malignant neoplasm of breast: Secondary | ICD-10-CM

## 2021-06-18 ENCOUNTER — Other Ambulatory Visit: Payer: Self-pay

## 2021-06-18 ENCOUNTER — Encounter (INDEPENDENT_AMBULATORY_CARE_PROVIDER_SITE_OTHER): Payer: Self-pay | Admitting: Ophthalmology

## 2021-06-18 ENCOUNTER — Ambulatory Visit (INDEPENDENT_AMBULATORY_CARE_PROVIDER_SITE_OTHER): Payer: Medicare Other | Admitting: Ophthalmology

## 2021-06-18 DIAGNOSIS — H353212 Exudative age-related macular degeneration, right eye, with inactive choroidal neovascularization: Secondary | ICD-10-CM

## 2021-06-18 DIAGNOSIS — E119 Type 2 diabetes mellitus without complications: Secondary | ICD-10-CM | POA: Diagnosis not present

## 2021-06-18 DIAGNOSIS — H353121 Nonexudative age-related macular degeneration, left eye, early dry stage: Secondary | ICD-10-CM | POA: Diagnosis not present

## 2021-06-18 DIAGNOSIS — H34232 Retinal artery branch occlusion, left eye: Secondary | ICD-10-CM | POA: Diagnosis not present

## 2021-06-18 NOTE — Assessment & Plan Note (Signed)
Early geographic atrophy

## 2021-06-18 NOTE — Assessment & Plan Note (Signed)
No overt diabetic eye disease

## 2021-06-18 NOTE — Assessment & Plan Note (Signed)
Old central disciform accounts for acuity from subfoveal scarring

## 2021-06-18 NOTE — Progress Notes (Signed)
06/18/2021     CHIEF COMPLAINT Patient presents for  Chief Complaint  Patient presents with   Retina Follow Up      HISTORY OF PRESENT ILLNESS: Gloria [name] is a 81 y.o. female who presents to the clinic today for:   HPI     Retina Follow Up           Diagnosis: Wet AMD   Laterality: right eye   Onset: 6 months ago   Severity: mild   Duration: 6 months         Comments   6 mos fu OU oct fp. Pt daughter states "She went septic and went into the hospital last week 1/24-1/29." Patient states vision is more wavy recently. States "it has been slowly coming on. When I watch t.v. or try to read I see the waviness."       Last edited by Laurin Coder on 06/18/2021 10:38 AM.      Referring physician: Haywood Pao, MD [place],  [place] [id number]  HISTORICAL INFORMATION:   Selected notes from the MEDICAL RECORD NUMBER    Lab Results  Component Value Date   HGBA1C 7.1 (H) 02/23/2018     CURRENT MEDICATIONS: Current Outpatient Medications (Ophthalmic Drugs)  Medication Sig   carboxymethylcellulose (REFRESH PLUS) 0.5 % SOLN Place 1 drop into both eyes 3 (three) times daily as needed (for dry/irritated eyes.).    No current facility-administered medications for this visit. (Ophthalmic Drugs)   Current Outpatient Medications (Other)  Medication Sig   amiodarone (PACERONE) 200 MG tablet TAKE 1 TABLET BY MOUTH EVERY DAY   atorvastatin (LIPITOR) 80 MG tablet Take 80 mg by mouth every morning.    B-D 3CC LUER-LOK SYR 25GX5/8" 25G X 5/8" 3 ML MISC INJECT 1 ML INTO THE MUSCLE EVERY 30 (THIRTY) DAYS.   Cyanocobalamin (B-12 COMPLIANCE INJECTION) 1000 MCG/ML KIT Inject 1,000 mcg as directed every 14 (fourteen) days.   diltiazem (DILACOR XR) 180 MG 24 hr capsule Take 180 mg by mouth daily.   diphenhydrAMINE (BENADRYL) 25 MG tablet Take 25 mg by mouth at bedtime.    ELIQUIS 5 MG TABS tablet TAKE 1 TABLET BY MOUTH TWICE A DAY (Patient not  taking: Reported on 09/01/2019)   fenofibrate 160 MG tablet Take 160 mg by mouth every morning.    ferrous sulfate 324 MG TBEC Take 324 mg by mouth.   hydrALAZINE (APRESOLINE) 25 MG tablet Take 25 mg by mouth daily.   hydrochlorothiazide (HYDRODIURIL) 25 MG tablet Take 1 tablet (25 mg total) by mouth 2 (two) times daily.   JANUVIA 50 MG tablet Take 50 mg by mouth every morning.    LANTUS SOLOSTAR 100 UNIT/ML Solostar Pen Inject 45 Units into the skin at bedtime. Per sliding scale   lidocaine (LIDODERM) 5 % Place 1 patch onto the skin daily as needed (for pain). Remove & Discard patch within 12 hours or as directed by MD   Menthol, Topical Analgesic, (BIOFREEZE EX) Apply 1 application topically 2 (two) times daily as needed (for pain).    metoprolol tartrate (LOPRESSOR) 25 MG tablet Take 0.5 tablets (12.5 mg total) by mouth 2 (two) times daily.   metroNIDAZOLE (FLAGYL) 500 MG tablet Take 500 mg by mouth 2 (two) times daily.   Needles & Syringes MISC 1 Units by Does not apply route every 30 (thirty) days.   omeprazole (PRILOSEC) 20 MG capsule Take 20 mg by mouth daily before breakfast.  promethazine (PHENERGAN) 12.5 MG tablet Take 1 tablet (12.5 mg total) by mouth every 6 (six) hours as needed for nausea or vomiting.   sennosides-docusate sodium (SENOKOT-S) 8.6-50 MG tablet Take 2 tablets by mouth daily.   torsemide (DEMADEX) 20 MG tablet Take 20 mg by mouth in the morning, at noon, and at bedtime.   warfarin (COUMADIN) 2.5 MG tablet Take 2.5 mg by mouth daily. Tuesday, Wednesday, Thursday, Saturday, Sunday   warfarin (COUMADIN) 5 MG tablet Take 5 mg by mouth daily. Monday and Friday   No current facility-administered medications for this visit. (Other)      REVIEW OF SYSTEMS:    ALLERGIES Allergies  Allergen Reactions   Iodinated Contrast Media Anaphylaxis   Iohexol Hives, Swelling and Other (See Comments)     Desc: ANGIO EDEMA W/ ANAPHYLAXIS PRIOR... HIVES POST 13 HR  PREP...OK/A.C.    Codeine Nausea And Vomiting    SEVERE   Oxycodone Nausea And Vomiting    SEVERE   Levaquin [Levofloxacin In D5w] Nausea Only and Other (See Comments)    dizziness   Propoxyphene N-Acetaminophen Nausea And Vomiting    PAST MEDICAL HISTORY Past Medical History:  Diagnosis Date   Anticoagulant long-term use    eliquis   B12 deficiency    Carotid stenosis, bilateral    followed by dr Trula Slade--- per last duplex 08-11-2017  patent right ICA,  left ICA 40-59%,  left CEA >50%   Chronic anemia    CKD (chronic kidney disease), stage III ([place])    Diabetic retinopathy ([place])    Diastolic CHF, chronic ([place])    Family history of adverse reaction to anesthesia    daughter - violent   GERD (gastroesophageal reflux disease)    Hyperlipidemia    Hypertension    Macular degeneration    Right eye only,  loss of peripheral vision   MGUS (monoclonal gammopathy of unknown significance)    followed by dr Lindi Adie ([facility])   OA (osteoarthritis)    knees, fingers, back   OSA on CPAP 11/05/2017   Severe sleep apnea during REM sleep with oxygen saturations dropping to 78%.  Due to severe nocturnal hypoxemia as well as significant sleep apnea during REM sleep she is now on CPAP at  11 cm H2O   PAF (paroxysmal atrial fibrillation) ([place]) 05/2017   [facility] cardiologist-  dr Dorris Carnes   Peripheral neuropathy    bilateral feet post back surgery   Peripheral vascular disease ([place])    hx rt carotid s/p cea   PONV (postoperative nausea and vomiting)    Primary localized osteoarthritis of right knee 03/02/2018   Type 2 diabetes mellitus treated with insulin ([place])    followed by pcp   Past Surgical History:  Procedure Laterality Date   CARDIAC CATHETERIZATION  05-27-2005   dr Doreatha Lew   mild to moderate coronary atherosclerosis, 60% ostial narrowing bifurcation OM and 60% D2 w/ minor irregularities otherwise ,  normal LVF   CARDIOVASCULAR STRESS TEST  02-26-2012    dr Aundra Dubin    normal dobutamin nuclera study w/ no ischemia/  normal LV funciton and wall motion , ef 66%   CARDIOVERSION N/A 06/07/2017   Procedure: CARDIOVERSION;  Surgeon: Thompson Grayer, MD;  Location: [facility];  Service: Cardiovascular;  Laterality: N/A;   CAROTID ENDARTERECTOMY Right 06-06-2005   dr Amedeo Plenty '@[facility]'    CATARACT EXTRACTION Right 08/20/2016   Dr. Katy Fitch   CATARACT EXTRACTION Left 10/11/2016   Dr. Katy Fitch   CATARACT EXTRACTION  W/ INTRAOCULAR LENS  IMPLANT, BILATERAL  2018   CHOLECYSTECTOMY OPEN  1968   AND APPENDECTOMY   EYE SURGERY Right 2017    Retinal tear-    LUMBAR LAMINECTOMY/DECOMPRESSION MICRODISCECTOMY  02/27/2012   Procedure: LUMBAR LAMINECTOMY/DECOMPRESSION MICRODISCECTOMY 2 LEVELS;  Surgeon: Kristeen Miss, MD;  Location: [facility];  Service: Neurosurgery;  Laterality: N/A;  Lumbar two-three, four-five Laminectomy/foraminotomy   TONSILLECTOMY  AGE 24   TOTAL KNEE ARTHROPLASTY Right 03/02/2018   Procedure: RIGHT TOTAL KNEE ARTHROPLASTY;  Surgeon: Marchia Bond, MD;  Location: [facility];  Service: Orthopedics;  Laterality: Right;   TRANSTHORACIC ECHOCARDIOGRAM  06-02-2017   dr p. ross   mild LVH,  ef 55%, grade 2 diastoic dysfunction/  trivial MR and TR/  moderate LAE/  mild RAE    FAMILY HISTORY Family History  Problem Relation Age of Onset   Heart disease Mother        Heart Disease before age 14   Heart disease Father    Heart disease Sister        Heart Disease before age 26   Breast cancer Daughter 56    SOCIAL HISTORY Social History   Tobacco Use   Smoking status: Former    Packs/day: 0.25    Years: 32.00    Pack years: 8.00    Types: Cigarettes    Quit date: 02/20/2004    Years since quitting: 17.3   Smokeless tobacco: Never  Vaping Use   Vaping Use: Never used  Substance Use Topics   Alcohol use: No   Drug use: Never         OPHTHALMIC EXAM:  Base Eye Exam     Visual Acuity (ETDRS)       Right Left   Dist [place] CF at 3' 20/40 -2   Dist ph [place] NI NI          Tonometry (Tonopen, 10:41 AM)       Right Left   Pressure 13 15         Pupils       Pupils Dark Light APD   Right PERRL 4 3 None   Left PERRL 4 3 None         Extraocular Movement       Right Left    Full Full         Neuro/Psych     Oriented x3: Yes   Mood/Affect: Normal         Dilation     Both eyes: 1.0% Mydriacyl, 2.5% Phenylephrine @ 10:42 AM           Slit Lamp and Fundus Exam     External Exam       Right Left   External Normal Normal         Slit Lamp Exam       Right Left   Lids/Lashes Normal Normal   Conjunctiva/Sclera White and quiet White and quiet   Cornea Clear Clear   Anterior Chamber Deep and quiet Deep and quiet   Iris Round and reactive Round and reactive   Lens Posterior chamber intraocular lens Posterior chamber intraocular lens   Anterior Vitreous Normal Normal         Fundus Exam       Right Left   Posterior Vitreous Posterior vitreous detachment Posterior vitreous detachment   Disc Normal Normal, calcific Hollenhorst plaque at the nerve inferior branch retinal artery bifurcation   C/D Ratio 0.3 0.25  Macula Pigmented atrophy, Disciform scar, large quiet centrally 5-6 disc area size Geographic atrophy, Retinal pigment epithelial mottling   Vessels Normal Retinal artery occlusion left eye inferiorly with cholesterol calcific embolus   Periphery Peripheral, retina attached.  Good scleral buckle blot hemorrhage inferiorly in 2 quadrants, Dot-blot hemorrhage            IMAGING AND PROCEDURES  Imaging and Procedures for 06/18/21  Color Fundus Photography Optos - OU - Both Eyes       Right Eye Progression has been stable. Disc findings include normal observations. Macula : normal observations. Periphery : normal observations.   Left Eye Progression has worsened. Disc findings include normal observations. Macula : normal observations. Periphery : normal observations.   Notes Calcific  intra-arterial Hollenhorst plaque at the nerve affecting the inferior branch retinal artery OS with inferotemporal retinal atrophy along inferotemporal arcade, no NV, no change over time, dot blot hemorrhages inferior to quadrant  OD, with chronic old unchanged disc a form macular scar, with extra macular, good scleral buckle peripherally     OCT, Retina - OU - Both Eyes       Right Eye Quality was good. Scan locations included subfoveal. Central Foveal Thickness: 482. Progression has improved. Findings include abnormal foveal contour.   Left Eye Quality was good. Scan locations included subfoveal. Central Foveal Thickness: 258. Progression has been stable. Findings include abnormal foveal contour.   Notes Inferior retinal atrophy in the macular region left eye along the region of branch retinal artery occlusion.  No change.  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 Exudative age-related macular degeneration of right eye with inactive choroidal neovascularization (HCC)  H35.3212 Color Fundus Photography Optos - OU - Both Eyes    OCT, Retina - OU - Both Eyes    2. 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 Diabetes mellitus without complication (HCC)  Z16.9       1.  OS, stable inferior temporal branch retinal artery occlusion no propagation preserved acuity paracentral scotomas discussed with the patient accounted for by this poor perfusion  2.  OS with intermediate ARMD no sign of CNVM  3.  OD with a history of disciform scarring limits acuity  4.  OD with a history of retinal detachment good scleral buckle in place.  I reassured the patient recent sepsis has no impact on acuity Ophthalmic Meds Ordered this visit:  No orders of the defined types were placed in this encounter.      No follow-ups on file.  There are no Patient Instructions on file for this visit.   Explained the diagnoses, plan, and follow up with the  patient and they expressed understanding.  Patient expressed understanding of the importance of proper follow up care.   Clent Demark Jacinto Keil M.D. Diseases & Surgery of the Retina and Vitreous Retina [facility] Diabetic [place] 06/18/21     Abbreviations: M myopia (nearsighted); A astigmatism; H hyperopia (farsighted); P presbyopia; Mrx spectacle prescription;  CTL contact lenses; OD right eye; OS left eye; OU both eyes  XT exotropia; ET esotropia; PEK punctate epithelial keratitis; PEE punctate epithelial erosions; DES dry eye syndrome; MGD meibomian gland dysfunction; ATs artificial tears; PFAT's preservative free artificial tears; [place] nuclear sclerotic cataract; PSC posterior subcapsular cataract; ERM epi-retinal membrane; PVD posterior vitreous detachment; RD retinal detachment; DM diabetes mellitus; DR diabetic retinopathy; NPDR non-proliferative diabetic retinopathy; PDR proliferative diabetic retinopathy; CSME clinically significant macular edema; DME diabetic macular edema; dbh dot blot hemorrhages; CWS cotton wool spot; POAG primary open angle glaucoma; C/D cup-to-disc ratio; HVF humphrey visual field; GVF  goldmann visual field; OCT optical coherence tomography; IOP intraocular pressure; BRVO Branch retinal vein occlusion; CRVO central retinal vein occlusion; CRAO central retinal artery occlusion; BRAO branch retinal artery occlusion; RT retinal tear; SB scleral buckle; PPV pars plana vitrectomy; VH Vitreous hemorrhage; PRP panretinal laser photocoagulation; IVK intravitreal kenalog; VMT vitreomacular traction; MH Macular hole;  NVD neovascularization of the disc; NVE neovascularization elsewhere; AREDS age related eye disease study; ARMD age related macular degeneration; POAG primary open angle glaucoma; EBMD epithelial/anterior basement membrane dystrophy; ACIOL anterior chamber intraocular lens; IOL intraocular lens; PCIOL posterior chamber intraocular lens; Phaco/IOL phacoemulsification with intraocular lens placement; [place] photorefractive keratectomy; LASIK laser assisted in situ keratomileusis; HTN hypertension; DM diabetes mellitus; COPD chronic obstructive pulmonary disease

## 2021-06-18 NOTE — Assessment & Plan Note (Signed)
Stable overall with no progression, inferior dot blot hemorrhages likely from retinal nonperfusion in the watershed areas of the retina peripherally

## 2021-06-19 ENCOUNTER — Encounter (INDEPENDENT_AMBULATORY_CARE_PROVIDER_SITE_OTHER): Payer: Medicare Other | Admitting: Ophthalmology

## 2021-06-24 ENCOUNTER — Telehealth: Payer: Self-pay | Admitting: Hematology and Oncology

## 2021-06-24 ENCOUNTER — Telehealth: Payer: Self-pay | Admitting: *Deleted

## 2021-06-24 NOTE — Telephone Encounter (Signed)
Received VM from pt.  RN attempt x1 to return call.  No answer, LVM for pt to return call to the office.  °

## 2021-06-24 NOTE — Telephone Encounter (Signed)
Sch per 2/6 inbasket, pt aware

## 2021-07-17 DEATH — deceased

## 2021-09-18 ENCOUNTER — Ambulatory Visit: Payer: Medicare Other | Admitting: Hematology and Oncology

## 2021-12-16 ENCOUNTER — Encounter (INDEPENDENT_AMBULATORY_CARE_PROVIDER_SITE_OTHER): Payer: Medicare Other | Admitting: Ophthalmology

## 2021-12-17 ENCOUNTER — Encounter (INDEPENDENT_AMBULATORY_CARE_PROVIDER_SITE_OTHER): Payer: Medicare Other | Admitting: Ophthalmology
